# Patient Record
Sex: Male | Born: 1945 | Race: White | Hispanic: No | Marital: Married | Smoking: Former smoker
Health system: Southern US, Community
[De-identification: ages and names within clinical notes are randomized; demographics above are authoritative.]

## PROBLEM LIST (undated history)

## (undated) DIAGNOSIS — J449 Chronic obstructive pulmonary disease, unspecified: Secondary | ICD-10-CM

## (undated) DIAGNOSIS — E785 Hyperlipidemia, unspecified: Secondary | ICD-10-CM

## (undated) DIAGNOSIS — Z860100 Personal history of colon polyps, unspecified: Secondary | ICD-10-CM

## (undated) DIAGNOSIS — I2510 Atherosclerotic heart disease of native coronary artery without angina pectoris: Secondary | ICD-10-CM

## (undated) DIAGNOSIS — E559 Vitamin D deficiency, unspecified: Secondary | ICD-10-CM

## (undated) DIAGNOSIS — J45909 Unspecified asthma, uncomplicated: Secondary | ICD-10-CM

## (undated) DIAGNOSIS — G4730 Sleep apnea, unspecified: Secondary | ICD-10-CM

## (undated) DIAGNOSIS — K219 Gastro-esophageal reflux disease without esophagitis: Secondary | ICD-10-CM

## (undated) DIAGNOSIS — N529 Male erectile dysfunction, unspecified: Secondary | ICD-10-CM

## (undated) DIAGNOSIS — N400 Enlarged prostate without lower urinary tract symptoms: Secondary | ICD-10-CM

## (undated) DIAGNOSIS — I10 Essential (primary) hypertension: Secondary | ICD-10-CM

## (undated) DIAGNOSIS — R011 Cardiac murmur, unspecified: Secondary | ICD-10-CM

## (undated) DIAGNOSIS — M545 Low back pain, unspecified: Secondary | ICD-10-CM

## (undated) DIAGNOSIS — F329 Major depressive disorder, single episode, unspecified: Secondary | ICD-10-CM

## (undated) DIAGNOSIS — F419 Anxiety disorder, unspecified: Secondary | ICD-10-CM

## (undated) DIAGNOSIS — F919 Conduct disorder, unspecified: Secondary | ICD-10-CM

## (undated) DIAGNOSIS — D696 Thrombocytopenia, unspecified: Secondary | ICD-10-CM

## (undated) DIAGNOSIS — F4310 Post-traumatic stress disorder, unspecified: Secondary | ICD-10-CM

## (undated) DIAGNOSIS — E119 Type 2 diabetes mellitus without complications: Secondary | ICD-10-CM

## (undated) DIAGNOSIS — K5900 Constipation, unspecified: Secondary | ICD-10-CM

## (undated) HISTORY — PX: CORONARY ARTERY BYPASS GRAFT: SHX141

## (undated) HISTORY — PX: SEPTOPLASTY: SUR1290

## (undated) HISTORY — PX: COLONOSCOPY: SHX174

## (undated) HISTORY — PX: OTHER SURGICAL HISTORY: SHX169

## (undated) HISTORY — PX: CORONARY ANGIOPLASTY: SHX604

## (undated) HISTORY — PX: TONSILLECTOMY: SUR1361

## (undated) HISTORY — DX: Anxiety disorder, unspecified: F41.9

---

## 1997-09-22 ENCOUNTER — Encounter: Admission: RE | Admit: 1997-09-22 | Discharge: 1997-12-21 | Payer: Self-pay | Admitting: Anesthesiology

## 1998-01-04 ENCOUNTER — Inpatient Hospital Stay (HOSPITAL_COMMUNITY): Admission: EM | Admit: 1998-01-04 | Discharge: 1998-01-10 | Payer: Self-pay | Admitting: Emergency Medicine

## 1998-01-05 ENCOUNTER — Encounter: Payer: Self-pay | Admitting: Neurology

## 1999-01-17 ENCOUNTER — Emergency Department (HOSPITAL_COMMUNITY): Admission: EM | Admit: 1999-01-17 | Discharge: 1999-01-17 | Payer: Self-pay | Admitting: Emergency Medicine

## 1999-02-20 ENCOUNTER — Encounter: Payer: Self-pay | Admitting: Neurosurgery

## 1999-02-22 ENCOUNTER — Ambulatory Visit (HOSPITAL_COMMUNITY): Admission: RE | Admit: 1999-02-22 | Discharge: 1999-02-22 | Payer: Self-pay | Admitting: Neurosurgery

## 1999-02-22 ENCOUNTER — Encounter: Payer: Self-pay | Admitting: Neurosurgery

## 1999-04-10 HISTORY — PX: CAROTID STENT: SHX1301

## 1999-04-10 HISTORY — PX: LUMBAR FUSION: SHX111

## 2004-04-09 HISTORY — PX: CORONARY ARTERY BYPASS GRAFT: SHX141

## 2005-04-09 HISTORY — PX: CAROTID STENT: SHX1301

## 2005-08-08 ENCOUNTER — Encounter: Payer: Self-pay | Admitting: Emergency Medicine

## 2012-09-13 DIAGNOSIS — N400 Enlarged prostate without lower urinary tract symptoms: Secondary | ICD-10-CM

## 2012-09-13 HISTORY — DX: Benign prostatic hyperplasia without lower urinary tract symptoms: N40.0

## 2013-01-21 DIAGNOSIS — F411 Generalized anxiety disorder: Secondary | ICD-10-CM

## 2013-01-21 HISTORY — DX: Generalized anxiety disorder: F41.1

## 2013-01-22 ENCOUNTER — Other Ambulatory Visit: Payer: Self-pay | Admitting: Family Medicine

## 2013-01-22 DIAGNOSIS — G629 Polyneuropathy, unspecified: Secondary | ICD-10-CM

## 2013-01-22 DIAGNOSIS — R531 Weakness: Secondary | ICD-10-CM

## 2013-01-22 DIAGNOSIS — M6250 Muscle wasting and atrophy, not elsewhere classified, unspecified site: Secondary | ICD-10-CM

## 2013-01-28 ENCOUNTER — Ambulatory Visit (INDEPENDENT_AMBULATORY_CARE_PROVIDER_SITE_OTHER): Payer: Medicare Other | Admitting: Neurology

## 2013-01-28 ENCOUNTER — Other Ambulatory Visit: Payer: Self-pay | Admitting: Neurology

## 2013-01-28 ENCOUNTER — Ambulatory Visit (INDEPENDENT_AMBULATORY_CARE_PROVIDER_SITE_OTHER): Payer: Self-pay | Admitting: Radiology

## 2013-01-28 DIAGNOSIS — Z0289 Encounter for other administrative examinations: Secondary | ICD-10-CM

## 2013-01-28 DIAGNOSIS — R209 Unspecified disturbances of skin sensation: Secondary | ICD-10-CM

## 2013-01-28 DIAGNOSIS — G5602 Carpal tunnel syndrome, left upper limb: Secondary | ICD-10-CM

## 2013-01-28 DIAGNOSIS — G63 Polyneuropathy in diseases classified elsewhere: Secondary | ICD-10-CM

## 2013-01-28 NOTE — Procedures (Signed)
  HISTORY:  Albert Hayes is a 67 year old gentleman with a six-month history of a progressive quadriparesis, numbness in the feet and legs, and problems with gait instability. The patient has noted some problems controlling the bladder as well over the last 6 months. The patient is being evaluated for these issues.  NERVE CONDUCTION STUDIES:  Nerve conduction studies were performed on the left upper extremity. The distal motor latency for the median nerve was prolonged, with a normal motor amplitude. The distal motor latency and motor amplitudes for the left ulnar nerve is normal. The F wave latencies for the left median and ulnar nerves were normal, with slowing seen for the left median nerve, normal nerve conduction velocities seen for the ulnar nerve. The sensory latencies for the left median, ulnar, and radial nerves were prolonged.  Nerve conduction studies were performed on both lower extremities. The distal motor latencies for the peroneal nerves were normal bilaterally, with low motor amplitudes for these nerves bilaterally. The distal motor latencies and motor amplitudes for the posterior tibial nerves were normal. The nerve conduction velocities for the peroneal nerves were slowed above the knees, normal below the knees. Slowing was seen for the posterior tibial nerves bilaterally. The F wave latencies for the peroneal nerves were prolonged on the right, unobtainable on the left, and normal for the posterior tibial nerves bilaterally. The sural sensory latencies were normal bilaterally, but the peroneal sensory latencies were absent bilaterally.  EMG STUDIES:  EMG study was performed on the left lower extremity:  The tibialis anterior muscle reveals 2 to 45K motor units with decreased recruitment. No fibrillations or positive waves were seen. The peroneus tertius muscle reveals 2 to 6K motor units with decreased recruitment. No fibrillations or positive waves were seen. The medial  gastrocnemius muscle reveals 1 to 3K motor units with decreased recruitment. No fibrillations or positive waves were seen.  The patient refused further EMG evaluation.   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 There is an overlying left carpal tunnel syndrome that is mild. A limited EMG evaluation of the left lower extremity shows chronic stable denervation below the knee consistent with the diagnosis of the peripheral neuropathy. The patient refused further EMG evaluation of the left leg, and the left arm. The patient refused further study. A left lumbosacral radiculopathy cannot be excluded on this evaluation. An anterior horn cell disease process cannot be confirmed or excluded, but the clinical history suggests that an anterior horn cell disease process is not likely. A cervical myelopathy should be considered.  [name] MD 01/28/2013 10:54 AM  [facility] [address] [place], [place] [address]  Phone [phone] Fax [phone]

## 2013-01-29 ENCOUNTER — Ambulatory Visit
Admission: RE | Admit: 2013-01-29 | Discharge: 2013-01-29 | Disposition: A | Discharge status: Home / Self Care | Payer: Medicare Other | Source: Ambulatory Visit | Attending: Family Medicine | Admitting: Family Medicine

## 2013-01-29 DIAGNOSIS — G629 Polyneuropathy, unspecified: Secondary | ICD-10-CM

## 2013-01-29 DIAGNOSIS — M6250 Muscle wasting and atrophy, not elsewhere classified, unspecified site: Secondary | ICD-10-CM

## 2013-01-29 DIAGNOSIS — R531 Weakness: Secondary | ICD-10-CM

## 2013-02-27 DIAGNOSIS — F4310 Post-traumatic stress disorder, unspecified: Secondary | ICD-10-CM

## 2013-02-27 DIAGNOSIS — M5030 Other cervical disc degeneration, unspecified cervical region: Secondary | ICD-10-CM

## 2013-02-27 DIAGNOSIS — M47812 Spondylosis without myelopathy or radiculopathy, cervical region: Secondary | ICD-10-CM

## 2013-02-27 DIAGNOSIS — R404 Transient alteration of awareness: Secondary | ICD-10-CM | POA: Insufficient documentation

## 2013-02-27 DIAGNOSIS — M5137 Other intervertebral disc degeneration, lumbosacral region: Secondary | ICD-10-CM

## 2013-02-27 DIAGNOSIS — M47817 Spondylosis without myelopathy or radiculopathy, lumbosacral region: Secondary | ICD-10-CM

## 2013-02-27 DIAGNOSIS — M51379 Other intervertebral disc degeneration, lumbosacral region without mention of lumbar back pain or lower extremity pain: Secondary | ICD-10-CM

## 2013-02-27 HISTORY — DX: Post-traumatic stress disorder, unspecified: F43.10

## 2013-02-27 HISTORY — DX: Spondylosis without myelopathy or radiculopathy, cervical region: M47.812

## 2013-02-27 HISTORY — DX: Spondylosis without myelopathy or radiculopathy, lumbosacral region: M47.817

## 2013-02-27 HISTORY — DX: Other intervertebral disc degeneration, lumbosacral region without mention of lumbar back pain or lower extremity pain: M51.379

## 2013-02-27 HISTORY — DX: Other intervertebral disc degeneration, lumbosacral region: M51.37

## 2013-02-27 HISTORY — DX: Other cervical disc degeneration, unspecified cervical region: M50.30

## 2013-11-03 ENCOUNTER — Ambulatory Visit: Payer: Medicare Other | Attending: Family Medicine | Admitting: Physical Therapy

## 2013-11-03 DIAGNOSIS — I10 Essential (primary) hypertension: Secondary | ICD-10-CM | POA: Diagnosis not present

## 2013-11-03 DIAGNOSIS — R5381 Other malaise: Secondary | ICD-10-CM | POA: Insufficient documentation

## 2013-11-03 DIAGNOSIS — M545 Low back pain, unspecified: Secondary | ICD-10-CM | POA: Insufficient documentation

## 2013-11-03 DIAGNOSIS — R262 Difficulty in walking, not elsewhere classified: Secondary | ICD-10-CM | POA: Insufficient documentation

## 2013-11-03 DIAGNOSIS — R293 Abnormal posture: Secondary | ICD-10-CM | POA: Diagnosis not present

## 2013-11-03 DIAGNOSIS — Z951 Presence of aortocoronary bypass graft: Secondary | ICD-10-CM | POA: Insufficient documentation

## 2013-11-03 DIAGNOSIS — M6281 Muscle weakness (generalized): Secondary | ICD-10-CM | POA: Insufficient documentation

## 2013-11-03 DIAGNOSIS — R32 Unspecified urinary incontinence: Secondary | ICD-10-CM | POA: Insufficient documentation

## 2013-11-03 DIAGNOSIS — IMO0001 Reserved for inherently not codable concepts without codable children: Secondary | ICD-10-CM | POA: Diagnosis present

## 2013-11-03 DIAGNOSIS — G609 Hereditary and idiopathic neuropathy, unspecified: Secondary | ICD-10-CM | POA: Insufficient documentation

## 2013-11-03 DIAGNOSIS — R5383 Other fatigue: Secondary | ICD-10-CM

## 2013-11-05 ENCOUNTER — Ambulatory Visit: Payer: Medicare Other | Admitting: Rehabilitation

## 2013-11-05 DIAGNOSIS — IMO0001 Reserved for inherently not codable concepts without codable children: Secondary | ICD-10-CM | POA: Diagnosis not present

## 2013-11-10 ENCOUNTER — Ambulatory Visit: Payer: Medicare Other | Attending: Family Medicine | Admitting: Physical Therapy

## 2013-11-10 DIAGNOSIS — M6281 Muscle weakness (generalized): Secondary | ICD-10-CM | POA: Insufficient documentation

## 2013-11-10 DIAGNOSIS — M545 Low back pain, unspecified: Secondary | ICD-10-CM | POA: Diagnosis not present

## 2013-11-10 DIAGNOSIS — R5383 Other fatigue: Secondary | ICD-10-CM | POA: Diagnosis not present

## 2013-11-10 DIAGNOSIS — R293 Abnormal posture: Secondary | ICD-10-CM | POA: Diagnosis not present

## 2013-11-10 DIAGNOSIS — G609 Hereditary and idiopathic neuropathy, unspecified: Secondary | ICD-10-CM | POA: Insufficient documentation

## 2013-11-10 DIAGNOSIS — IMO0001 Reserved for inherently not codable concepts without codable children: Secondary | ICD-10-CM | POA: Insufficient documentation

## 2013-11-10 DIAGNOSIS — R32 Unspecified urinary incontinence: Secondary | ICD-10-CM | POA: Diagnosis not present

## 2013-11-10 DIAGNOSIS — I10 Essential (primary) hypertension: Secondary | ICD-10-CM | POA: Insufficient documentation

## 2013-11-10 DIAGNOSIS — R5381 Other malaise: Secondary | ICD-10-CM | POA: Insufficient documentation

## 2013-11-10 DIAGNOSIS — R262 Difficulty in walking, not elsewhere classified: Secondary | ICD-10-CM | POA: Insufficient documentation

## 2013-11-10 DIAGNOSIS — Z951 Presence of aortocoronary bypass graft: Secondary | ICD-10-CM | POA: Insufficient documentation

## 2013-11-12 ENCOUNTER — Ambulatory Visit: Payer: Medicare Other | Admitting: Rehabilitation

## 2013-11-12 DIAGNOSIS — IMO0001 Reserved for inherently not codable concepts without codable children: Secondary | ICD-10-CM | POA: Diagnosis not present

## 2013-11-17 ENCOUNTER — Ambulatory Visit: Payer: Medicare Other | Admitting: Physical Therapy

## 2013-11-17 DIAGNOSIS — IMO0001 Reserved for inherently not codable concepts without codable children: Secondary | ICD-10-CM | POA: Diagnosis not present

## 2013-11-19 ENCOUNTER — Ambulatory Visit: Payer: Medicare Other | Admitting: Rehabilitation

## 2013-11-19 DIAGNOSIS — IMO0001 Reserved for inherently not codable concepts without codable children: Secondary | ICD-10-CM | POA: Diagnosis not present

## 2013-11-24 ENCOUNTER — Ambulatory Visit: Payer: Medicare Other | Admitting: Physical Therapy

## 2013-11-24 DIAGNOSIS — IMO0001 Reserved for inherently not codable concepts without codable children: Secondary | ICD-10-CM | POA: Diagnosis not present

## 2013-11-26 ENCOUNTER — Ambulatory Visit: Payer: Medicare Other | Admitting: Physical Therapy

## 2013-11-26 DIAGNOSIS — IMO0001 Reserved for inherently not codable concepts without codable children: Secondary | ICD-10-CM | POA: Diagnosis not present

## 2013-12-01 ENCOUNTER — Ambulatory Visit: Payer: Medicare Other | Admitting: Rehabilitation

## 2013-12-01 DIAGNOSIS — IMO0001 Reserved for inherently not codable concepts without codable children: Secondary | ICD-10-CM | POA: Diagnosis not present

## 2013-12-03 ENCOUNTER — Ambulatory Visit: Payer: Medicare Other | Admitting: Physical Therapy

## 2013-12-03 DIAGNOSIS — IMO0001 Reserved for inherently not codable concepts without codable children: Secondary | ICD-10-CM | POA: Diagnosis not present

## 2013-12-08 ENCOUNTER — Ambulatory Visit: Payer: Medicare Other | Attending: Family Medicine | Admitting: Rehabilitation

## 2013-12-08 DIAGNOSIS — I10 Essential (primary) hypertension: Secondary | ICD-10-CM | POA: Insufficient documentation

## 2013-12-08 DIAGNOSIS — M545 Low back pain, unspecified: Secondary | ICD-10-CM | POA: Insufficient documentation

## 2013-12-08 DIAGNOSIS — R293 Abnormal posture: Secondary | ICD-10-CM | POA: Insufficient documentation

## 2013-12-08 DIAGNOSIS — M6281 Muscle weakness (generalized): Secondary | ICD-10-CM | POA: Diagnosis not present

## 2013-12-08 DIAGNOSIS — G609 Hereditary and idiopathic neuropathy, unspecified: Secondary | ICD-10-CM | POA: Insufficient documentation

## 2013-12-08 DIAGNOSIS — IMO0001 Reserved for inherently not codable concepts without codable children: Secondary | ICD-10-CM | POA: Diagnosis present

## 2013-12-08 DIAGNOSIS — R262 Difficulty in walking, not elsewhere classified: Secondary | ICD-10-CM | POA: Insufficient documentation

## 2013-12-08 DIAGNOSIS — Z951 Presence of aortocoronary bypass graft: Secondary | ICD-10-CM | POA: Diagnosis not present

## 2013-12-08 DIAGNOSIS — R5383 Other fatigue: Secondary | ICD-10-CM

## 2013-12-08 DIAGNOSIS — R5381 Other malaise: Secondary | ICD-10-CM | POA: Diagnosis not present

## 2013-12-08 DIAGNOSIS — R32 Unspecified urinary incontinence: Secondary | ICD-10-CM | POA: Diagnosis not present

## 2013-12-10 ENCOUNTER — Ambulatory Visit: Payer: Medicare Other | Admitting: Rehabilitation

## 2013-12-10 DIAGNOSIS — IMO0001 Reserved for inherently not codable concepts without codable children: Secondary | ICD-10-CM | POA: Diagnosis not present

## 2013-12-15 ENCOUNTER — Ambulatory Visit: Payer: Medicare Other | Admitting: Physical Therapy

## 2013-12-15 DIAGNOSIS — IMO0001 Reserved for inherently not codable concepts without codable children: Secondary | ICD-10-CM | POA: Diagnosis not present

## 2013-12-17 ENCOUNTER — Ambulatory Visit: Payer: Medicare Other | Admitting: Physical Therapy

## 2014-04-09 HISTORY — PX: ESOPHAGOGASTRODUODENOSCOPY: SHX1529

## 2014-12-22 DIAGNOSIS — I10 Essential (primary) hypertension: Secondary | ICD-10-CM | POA: Insufficient documentation

## 2014-12-22 DIAGNOSIS — E785 Hyperlipidemia, unspecified: Secondary | ICD-10-CM | POA: Insufficient documentation

## 2014-12-22 DIAGNOSIS — J449 Chronic obstructive pulmonary disease, unspecified: Secondary | ICD-10-CM

## 2014-12-22 DIAGNOSIS — I2510 Atherosclerotic heart disease of native coronary artery without angina pectoris: Secondary | ICD-10-CM | POA: Insufficient documentation

## 2014-12-22 DIAGNOSIS — Z951 Presence of aortocoronary bypass graft: Secondary | ICD-10-CM | POA: Insufficient documentation

## 2014-12-22 HISTORY — DX: Presence of aortocoronary bypass graft: Z95.1

## 2014-12-22 HISTORY — DX: Atherosclerotic heart disease of native coronary artery without angina pectoris: I25.10

## 2014-12-22 HISTORY — DX: Chronic obstructive pulmonary disease, unspecified: J44.9

## 2015-02-14 ENCOUNTER — Encounter: Payer: Self-pay | Admitting: Family Medicine

## 2015-02-14 ENCOUNTER — Ambulatory Visit (INDEPENDENT_AMBULATORY_CARE_PROVIDER_SITE_OTHER): Payer: Medicare Other | Admitting: Family Medicine

## 2015-02-14 VITALS — BP 130/84 | HR 78 | Temp 98.8°F | Resp 16 | Ht 67.5 in | Wt 162.4 lb

## 2015-02-14 DIAGNOSIS — N529 Male erectile dysfunction, unspecified: Secondary | ICD-10-CM | POA: Diagnosis not present

## 2015-02-14 DIAGNOSIS — Z131 Encounter for screening for diabetes mellitus: Secondary | ICD-10-CM

## 2015-02-14 DIAGNOSIS — I10 Essential (primary) hypertension: Secondary | ICD-10-CM | POA: Diagnosis not present

## 2015-02-14 DIAGNOSIS — Z13220 Encounter for screening for lipoid disorders: Secondary | ICD-10-CM

## 2015-02-14 DIAGNOSIS — Z0000 Encounter for general adult medical examination without abnormal findings: Secondary | ICD-10-CM | POA: Diagnosis not present

## 2015-02-14 MED ORDER — SILDENAFIL CITRATE 20 MG PO TABS: ORAL_TABLET | ORAL | Status: DC

## 2015-02-14 NOTE — Patient Instructions (Signed)
You should receive a call or letter about your lab results within the next week to 10 days.  Keep follow up with your cardiologist.  Sign up for mychart and I can email lab results and plan.   Return to the clinic or go to the nearest emergency room if any of your symptoms worsen or new symptoms occur.  Keeping you healthy  Get these tests  Blood pressure- Have your blood pressure checked once a year by your healthcare provider.  Normal blood pressure is 120/80  Weight- Have your body mass index (BMI) calculated to screen for obesity.  BMI is a measure of body fat based on height and weight. You can also calculate your own BMI at ViewBanking.[email or web address].  Cholesterol- Have your cholesterol checked every year.  Diabetes- Have your blood sugar checked regularly if you have high blood pressure, high cholesterol, have a family history of diabetes or if you are overweight.  Screening for Colon Cancer- Colonoscopy starting at age 71.  Screening may begin sooner depending on your family history and other health conditions. Follow up colonoscopy as directed by your Gastroenterologist.  Screening for Prostate Cancer- Both blood work (PSA) and a rectal exam help screen for Prostate Cancer.  Screening begins at age 74 with African-American men and at age 30 with Caucasian men.  Screening may begin sooner depending on your family history.  Take these medicines  Aspirin- One aspirin daily can help prevent Heart disease and Stroke.  Flu shot- Every fall.  Tetanus- Every 10 years.  Zostavax- Once after the age of 63 to prevent Shingles.  Pneumonia shot- Once after the age of 79; if you are younger than 40, ask your healthcare provider if you need a Pneumonia shot.  Take these steps  Don't smoke- If you do smoke, talk to your doctor about quitting.  For tips on how to quit, go to www.smokefree.gov or call 1-800-QUIT-NOW.  Be physically active- Exercise 5 days a week for at least 30 minutes.  If  you are not already physically active start slow and gradually work up to 30 minutes of moderate physical activity.  Examples of moderate activity include walking briskly, mowing the yard, dancing, swimming, bicycling, etc.  Eat a healthy diet- Eat a variety of healthy food such as fruits, vegetables, low fat milk, low fat cheese, yogurt, lean meant, poultry, fish, beans, tofu, etc. For more information go to www.thenutritionsource.org  Drink alcohol in moderation- Limit alcohol intake to less than two drinks a day. Never drink and drive.  Dentist- Brush and floss twice daily; visit your dentist twice a year.  Depression- Your emotional health is as important as your physical health. If you're feeling down, or losing interest in things you would normally enjoy please talk to your healthcare provider.  Eye exam- Visit your eye doctor every year.  Safe sex- If you may be exposed to a sexually transmitted infection, use a condom.  Seat belts- Seat belts can save your life; always wear one.  Smoke/Carbon Monoxide detectors- These detectors need to be installed on the appropriate level of your home.  Replace batteries at least once a year.  Skin cancer- When out in the sun, cover up and use sunscreen 15 SPF or higher.  Violence- If anyone is threatening you, please tell your healthcare provider.  Living Will/ Health care power of attorney- Speak with your healthcare provider and family.

## 2015-02-14 NOTE — Progress Notes (Signed)
   Subjective:    Patient ID: Albert Hayes, male    DOB: 08-May-1945, 69 y.o.   MRN: [id number]  HPI    Review of Systems  Constitutional: Negative.   HENT: Negative.   Eyes: Negative.   Respiratory: Negative.   Cardiovascular: Negative.   Endocrine: Negative.   Genitourinary: Negative.   Musculoskeletal: Negative.   Skin: Negative.   Allergic/Immunologic: Negative.   Neurological: Negative.   Hematological: Negative.   Psychiatric/Behavioral: The patient is nervous/anxious.        Objective:   Physical Exam        Assessment & Plan:

## 2015-02-14 NOTE — Progress Notes (Addendum)
Subjective:    Patient ID: Albert Hayes, male    DOB: 03/26/1946, 69 y.o.   MRN: [id number] This chart was scribed for Merri Ray, MD by Zola Button, Medical Scribe. This patient was seen in Room 25 and the patient's care was started at 2:27 PM.    HPI HPI Comments: Albert Hayes is a 69 y.o. male who presents to the [facility] for an annual Medicare Wellness Exam. He is a new patient to me. Previously followed by Dr. Jannifer Franklin, neurology, for polyneuropathy and carpal tunnel syndrome. Takes Neurontin. Based on outside records, he has a history of COPD, and CAD s/p CABG (2005). Patient states he is on Livalo and ramipril for preventive measures. He is followed by Dr. Ricky Ala, cardiologist, with [facility] in [facility]. He is prescribed Neurontin for anxiety by a psychiatrist (he does not remember the name). He has been doing well with his anxiety. Patient is fasting today; he last ate before midnight last night.  Cancer screening:  Colon cancer screening - He is due for colonoscopy in 2017. Prostate cancer screening - Patient denies history of prostate problems. No FMHx of prostate cancer. Discussed prostate cancer screening, deferred at this point.  Immunizations: UTD on Tdap. Has received Zostavax, flu in October. He has had 1 pneumococcal vaccine in October 2015 at a pharmacy, but unknown which type.   Depression screening:  Depression screen [facility] 2/9 02/14/2015  Decreased Interest 0  Down, Depressed, Hopeless 0  PHQ - 2 Score 0   Fall screening: 0 falls in the past year.  Functional status survey: No positive responses on survey in office.   Vision: He wears corrective lenses. He saw his eye doctor last week and was told he needs to have cataracts surgery in his left eye.  Visual Acuity Screening   Right eye Left eye Both eyes  Without correction:     With correction: 20/40 20/70 20/25     Hearing: Denies difficulty hearing.   Dentist:  Patient has dentures in his top jaw and a few real teeth in his bottom jaw (caps on most of his teeth).  Advanced directives: He has a living will in the system at [facility].  Erectile dysfunction: He has used Cialis before, but has not used it in 2 years. He was cleared to take it by his cardiologist. He has not had sildenafil before. Patient denies chest tightness and SOB with the Cialis.  Patient is a retired Scientist, clinical (histocompatibility and immunogenetics).  There are no active problems to display for this patient.  Past Medical History  Diagnosis Date  . Anxiety    Past Surgical History  Procedure Laterality Date  . Coronary artery bypass graft    . Spine/bone surgery     Allergies  Allergen Reactions  . Bupropion   . Fluoxetine   . Fluvoxamine   . Penicillins     severely allergic  . Tamsulosin    Prior to Admission medications   Medication Sig Start Date End Date Taking? Authorizing Provider  aspirin 81 MG tablet Take 81 mg by mouth daily.   Yes Historical Provider, MD  gabapentin (NEURONTIN) 100 MG capsule Take 100 mg by mouth 3 (three) times daily.   Yes Historical Provider, MD  Pitavastatin Calcium (LIVALO) 2 MG TABS Take by mouth.   Yes Historical Provider, MD  ramipril (ALTACE) 2.5 MG capsule Take 2.5 mg by mouth daily.   Yes Historical Provider, MD   Social  History   Social History  . Marital Status: Married    Spouse Name: N/A  . Number of Children: N/A  . Years of Education: N/A   Occupational History  . Retired Scientist, clinical (histocompatibility and immunogenetics)    Social History Main Topics  . Smoking status: Former Research scientist (life sciences)  . Smokeless tobacco: Not on file  . Alcohol Use: No  . Drug Use: No  . Sexual Activity: Not on file   Other Topics Concern  . Not on file   Social History Narrative   Married. Education: high school/other. Exercise: Yes.     Review of Systems  Psychiatric/Behavioral: The patient is nervous/anxious.   13 point ROS reviewed on patient health survey. Negative other than listed above  or in nursing note. See nursing note.  Additionally, he wishes to discuss ED/sildenafil     Objective:   Physical Exam  Constitutional: He is oriented to person, place, and time. He appears well-developed and well-nourished.  HENT:  Head: Normocephalic and atraumatic.  Right Ear: External ear normal.  Left Ear: External ear normal.  Mouth/Throat: Oropharynx is clear and moist.  Eyes: Conjunctivae and EOM are normal. Pupils are equal, round, and reactive to light.  Neck: Normal range of motion. Neck supple. No thyromegaly present.  Cardiovascular: Normal rate, regular rhythm, normal heart sounds and intact distal pulses.   Pulmonary/Chest: Effort normal and breath sounds normal. No respiratory distress. He has no wheezes.  Abdominal: Soft. He exhibits no distension. There is no tenderness. Hernia confirmed negative in the right inguinal area and confirmed negative in the left inguinal area.  Musculoskeletal: Normal range of motion. He exhibits no edema or tenderness.  Lymphadenopathy:    He has no cervical adenopathy.  Neurological: He is alert and oriented to person, place, and time. He has normal reflexes.  Skin: Skin is warm and dry.  Psychiatric: He has a normal mood and affect. His behavior is normal.  Vitals reviewed.     Filed Vitals:   02/14/15 1353  BP: 130/84  Pulse: 78  Temp: 98.8 F (37.1 C)  TempSrc: Oral  Resp: 16  Height: 5' 7.5" (1.715 m)  Weight: 162 lb 6.4 oz (73.664 kg)  SpO2: 97%       Assessment & Plan:   Albert Hayes is a 69 y.o. male Medicare annual wellness visit, subsequent  --anticipatory guidance as below in AVS, screening labs above. Health maintenance items as above in HPI discussed/recommended as applicable.   Screening for diabetes mellitus - Plan: COMPLETE METABOLIC PANEL WITH GFR  Screening for hyperlipidemia - Plan: Lipid panel  Erectile dysfunction, unspecified erectile dysfunction type - Plan: sildenafil (REVATIO) 20 MG  tablet  -Tolerated Cialis in the past. Has been given the go ahead by his cardiologist to use this medication. sildenafil Rx given - discussed off label use, but similar to Viagra.. use lowest effective dose. Side effects discussed (including but not limited to headache/flushing, blue discoloration of vision, possible vascular steal and risk of cardiac effects if underlying unknown coronary artery disease, and permanent sensorineural hearing loss). Understanding expressed.   Meds ordered this encounter  Medications  . Pitavastatin Calcium (LIVALO) 2 MG TABS    Sig: Take by mouth.  . gabapentin (NEURONTIN) 100 MG capsule    Sig: Take 100 mg by mouth 3 (three) times daily.  . ramipril (ALTACE) 2.5 MG capsule    Sig: Take 2.5 mg by mouth daily.  Marland Kitchen aspirin 81 MG tablet    Sig: Take 81 mg  by mouth daily.  . sildenafil (REVATIO) 20 MG tablet    Sig: 1-2 tablets prior to onset of sexual activity.    Dispense:  30 tablet    Refill:  0   Patient Instructions  You should receive a call or letter about your lab results within the next week to 10 days.  Keep follow up with your cardiologist.  Sign up for mychart and I can email lab results and plan.   Return to the clinic or go to the nearest emergency room if any of your symptoms worsen or new symptoms occur.  Keeping you healthy  Get these tests  Blood pressure- Have your blood pressure checked once a year by your healthcare provider.  Normal blood pressure is 120/80  Weight- Have your body mass index (BMI) calculated to screen for obesity.  BMI is a measure of body fat based on height and weight. You can also calculate your own BMI at [employer]si.  Cholesterol- Have your cholesterol checked every year.  Diabetes- Have your blood sugar checked regularly if you have high blood pressure, high cholesterol, have a family history of diabetes or if you are overweight.  Screening for Colon Cancer- Colonoscopy starting at age 76.   Screening may begin sooner depending on your family history and other health conditions. Follow up colonoscopy as directed by your Gastroenterologist.  Screening for Prostate Cancer- Both blood work (PSA) and a rectal exam help screen for Prostate Cancer.  Screening begins at age 67 with African-American men and at age 27 with Caucasian men.  Screening may begin sooner depending on your family history.  Take these medicines  Aspirin- One aspirin daily can help prevent Heart disease and Stroke.  Flu shot- Every fall.  Tetanus- Every 10 years.  Zostavax- Once after the age of 63 to prevent Shingles.  Pneumonia shot- Once after the age of 22; if you are younger than 68, ask your healthcare provider if you need a Pneumonia shot.  Take these steps  Don't smoke- If you do smoke, talk to your doctor about quitting.  For tips on how to quit, go to www.smokefree.gov or call 1-800-QUIT-NOW.  Be physically active- Exercise 5 days a week for at least 30 minutes.  If you are not already physically active start slow and gradually work up to 30 minutes of moderate physical activity.  Examples of moderate activity include walking briskly, mowing the yard, dancing, swimming, bicycling, etc.  Eat a healthy diet- Eat a variety of healthy food such as fruits, vegetables, low fat milk, low fat cheese, yogurt, lean meant, poultry, fish, beans, tofu, etc. For more information go to www.thenutritionsource.org  Drink alcohol in moderation- Limit alcohol intake to less than two drinks a day. Never drink and drive.  Dentist- Brush and floss twice daily; visit your dentist twice a year.  Depression- Your emotional health is as important as your physical health. If you're feeling down, or losing interest in things you would normally enjoy please talk to your healthcare provider.  Eye exam- Visit your eye doctor every year.  Safe sex- If you may be exposed to a sexually transmitted infection, use a condom.  Seat  belts- Seat belts can save your life; always wear one.  Smoke/Carbon Monoxide detectors- These detectors need to be installed on the appropriate level of your home.  Replace batteries at least once a year.  Skin cancer- When out in the sun, cover up and use sunscreen 15 SPF or higher.  Violence- If  anyone is threatening you, please tell your healthcare provider.  Living Will/ Health care power of attorney- Speak with your healthcare provider and family.    I personally performed the services described in this documentation, which was scribed in my presence. The recorded information has been reviewed and considered, and addended by me as needed.    By signing my name below, I, Zola Button, attest that this documentation has been prepared under the direction and in the presence of Merri Ray, MD.  Electronically Signed: Zola Button, Medical Scribe. 02/14/2015. 2:27 PM.   I personally performed the services described in this documentation, which was scribed in my presence. The recorded information has been reviewed and considered, and addended by me as needed.

## 2015-02-15 LAB — COMPLETE METABOLIC PANEL WITH GFR
ALBUMIN: 4.3 g/dL (ref 3.6–5.1)
ALK PHOS: 72 U/L (ref 40–115)
ALT: 18 U/L (ref 9–46)
AST: 20 U/L (ref 10–35)
BILIRUBIN TOTAL: 0.8 mg/dL (ref 0.2–1.2)
BUN: 14 mg/dL (ref 7–25)
CO2: 30 mmol/L (ref 20–31)
CREATININE: 0.87 mg/dL (ref 0.70–1.25)
Calcium: 9.2 mg/dL (ref 8.6–10.3)
Chloride: 98 mmol/L (ref 98–110)
GFR, Est African American: >89 mL/min (ref >=60)
GFR, Est Non African American: 88 mL/min (ref >=60)
GLUCOSE: 85 mg/dL (ref 65–99)
Potassium: 4.4 mmol/L (ref 3.5–5.3)
SODIUM: 138 mmol/L (ref 135–146)
TOTAL PROTEIN: 7.1 g/dL (ref 6.1–8.1)

## 2015-02-15 LAB — LIPID PANEL
Cholesterol: 139 mg/dL (ref 125–200)
HDL: 41 mg/dL (ref >=40)
LDL Cholesterol: 76 mg/dL (ref <130)
Total CHOL/HDL Ratio: 3.4 Ratio (ref <=5.0)
Triglycerides: 109 mg/dL (ref <150)
VLDL: 22 mg/dL (ref <30)

## 2015-03-10 DIAGNOSIS — G4733 Obstructive sleep apnea (adult) (pediatric): Secondary | ICD-10-CM | POA: Diagnosis not present

## 2015-03-10 DIAGNOSIS — J449 Chronic obstructive pulmonary disease, unspecified: Secondary | ICD-10-CM | POA: Diagnosis not present

## 2015-03-21 ENCOUNTER — Ambulatory Visit: Payer: Medicare Other | Admitting: Family Medicine

## 2015-04-01 DIAGNOSIS — I10 Essential (primary) hypertension: Secondary | ICD-10-CM | POA: Diagnosis not present

## 2015-04-01 DIAGNOSIS — H02411 Mechanical ptosis of right eyelid: Secondary | ICD-10-CM | POA: Diagnosis not present

## 2015-04-01 DIAGNOSIS — H2511 Age-related nuclear cataract, right eye: Secondary | ICD-10-CM | POA: Diagnosis not present

## 2015-04-01 DIAGNOSIS — H18412 Arcus senilis, left eye: Secondary | ICD-10-CM | POA: Diagnosis not present

## 2015-04-01 DIAGNOSIS — H18411 Arcus senilis, right eye: Secondary | ICD-10-CM | POA: Diagnosis not present

## 2015-04-10 HISTORY — PX: ADENOIDECTOMY: SUR15

## 2015-04-10 HISTORY — PX: CATARACT EXTRACTION: SUR2

## 2015-05-13 DIAGNOSIS — H25012 Cortical age-related cataract, left eye: Secondary | ICD-10-CM | POA: Diagnosis not present

## 2015-05-13 DIAGNOSIS — H2512 Age-related nuclear cataract, left eye: Secondary | ICD-10-CM | POA: Diagnosis not present

## 2015-05-13 DIAGNOSIS — H2511 Age-related nuclear cataract, right eye: Secondary | ICD-10-CM | POA: Diagnosis not present

## 2015-05-13 DIAGNOSIS — H25042 Posterior subcapsular polar age-related cataract, left eye: Secondary | ICD-10-CM | POA: Diagnosis not present

## 2015-05-13 DIAGNOSIS — H25011 Cortical age-related cataract, right eye: Secondary | ICD-10-CM | POA: Diagnosis not present

## 2015-05-13 DIAGNOSIS — H25811 Combined forms of age-related cataract, right eye: Secondary | ICD-10-CM | POA: Diagnosis not present

## 2015-05-25 ENCOUNTER — Ambulatory Visit (INDEPENDENT_AMBULATORY_CARE_PROVIDER_SITE_OTHER): Payer: Medicare Other | Admitting: Family Medicine

## 2015-05-25 ENCOUNTER — Encounter: Payer: Self-pay | Admitting: Family Medicine

## 2015-05-25 VITALS — BP 126/80 | HR 72 | Temp 97.9°F | Resp 16 | Ht 68.0 in | Wt 163.8 lb

## 2015-05-25 DIAGNOSIS — L659 Nonscarring hair loss, unspecified: Secondary | ICD-10-CM | POA: Diagnosis not present

## 2015-05-25 DIAGNOSIS — N529 Male erectile dysfunction, unspecified: Secondary | ICD-10-CM

## 2015-05-25 DIAGNOSIS — R6882 Decreased libido: Secondary | ICD-10-CM

## 2015-05-25 DIAGNOSIS — R5383 Other fatigue: Secondary | ICD-10-CM

## 2015-05-25 NOTE — Patient Instructions (Signed)
Return in the morning for blood work.  You should receive a call or letter about your lab results within the next week to 10 days.  If testosterone is low, would need to repeat at least once to verify, and if low will refer you to urology to determine treatment and other testing (such as prostate test).  Return to the clinic or go to the nearest emergency room if any of your symptoms worsen or new symptoms occur.

## 2015-05-25 NOTE — Progress Notes (Signed)
Subjective:    Patient ID: Albert Hayes, male    DOB: [birth year], 70 y.o.   MRN: [id number] By signing my name below, I, Zola Button, attest that this documentation has been prepared under the direction and in the presence of Merri Ray, MD.  Electronically Signed: Zola Button, Medical Scribe. 05/25/2015. 1:58 PM.  HPI HPI Comments: Albert Hayes is a 70 y.o. male who presents to the [facility] to discuss testosterone. Last seen November for annual Medicare wellness visit. At that time, he did endorse symptoms of erectile dysfunction. Has used Cialis before and was cleared to do so by cardiology. Did decide on sildenafil 20 mg tablets, 1-2 as needed. Of note, he did decline prostate cancer screening at last visit. He had a normal CMP and lipid panel last visit. Patient has been taking 2 sildenafil at a time and notes the effectiveness had started to wane after the first few uses. Over the past 5-6 weeks, patient has noticed increased fatigue and decreased libido. His wife has also noted some hair loss over the past 2 months. He also states he has lost about 15 pounds over the past few months. He believes his symptoms may be related to his testosterone levels. Patient denies blood in stool, cough, night sweats, increased thirst, hematuria, urinary frequency, and other urinary symptoms. He also denies depression symptoms, recent stressors, and history of thyroid problems. He is married and has not had any problems in the relationship. Patient saw his psychiatrist 1 week ago (who he sees for anxiety, which is controlled) regarding his symptoms; it did not appear to be medication-related. He does have a history of depression in the 74s.  Wt Readings from Last 3 Encounters:  05/25/15 163 lb 12.8 oz (74.299 kg)  02/14/15 162 lb 6.4 oz (73.664 kg)     Patient Active Problem List   Diagnosis Date Noted  . Arteriosclerosis of coronary artery 12/22/2014  . BP (high blood  pressure) 12/22/2014  . Chronic obstructive pulmonary disease ([place]) 12/22/2014  . H/O coronary artery bypass surgery 12/22/2014  . HLD (hyperlipidemia) 12/22/2014   Past Medical History  Diagnosis Date  . Anxiety    Past Surgical History  Procedure Laterality Date  . Coronary artery bypass graft    . Spine/bone surgery     Allergies  Allergen Reactions  . Bupropion   . Fluoxetine   . Fluvoxamine   . Penicillins     severely allergic  . Tamsulosin    Prior to Admission medications   Medication Sig Start Date End Date Taking? Authorizing Provider  aspirin 81 MG tablet Take 81 mg by mouth daily.    Historical Provider, MD  gabapentin (NEURONTIN) 100 MG capsule Take 100 mg by mouth 3 (three) times daily.    Historical Provider, MD  Pitavastatin Calcium (LIVALO) 2 MG TABS Take by mouth.    Historical Provider, MD  ramipril (ALTACE) 2.5 MG capsule Take 2.5 mg by mouth daily.    Historical Provider, MD  sildenafil (REVATIO) 20 MG tablet 1-2 tablets prior to onset of sexual activity. 02/14/15   Wendie Agreste, MD   Social History   Social History  . Marital Status: Married    Spouse Name: N/A  . Number of Children: N/A  . Years of Education: N/A   Occupational History  . Retired Scientist, clinical (histocompatibility and immunogenetics)    Social History Main Topics  . Smoking status: Former Research scientist (life sciences)  . Smokeless tobacco: Not on file  .  Alcohol Use: No  . Drug Use: No  . Sexual Activity: Not on file   Other Topics Concern  . Not on file   Social History Narrative   Married. Education: high school/other. Exercise: Yes.     Review of Systems  Constitutional: Positive for fatigue. Negative for diaphoresis.  Respiratory: Negative for cough.   Gastrointestinal: Negative for blood in stool.  Endocrine: Negative for polydipsia.  Genitourinary: Negative.  Negative for dysuria and hematuria.  Psychiatric/Behavioral: Negative for dysphoric mood.       Objective:   Physical Exam  Constitutional: He is  oriented to person, place, and time. He appears well-developed and well-nourished. No distress.  HENT:  Head: Normocephalic and atraumatic.  Mouth/Throat: Oropharynx is clear and moist. No oropharyngeal exudate.  Eyes: Pupils are equal, round, and reactive to light.  Neck: Neck supple. No thyroid mass and no thyromegaly present.  Cardiovascular: Normal rate and regular rhythm.   Grade [id number] systolic murmur.  Pulmonary/Chest: Effort normal.  Abdominal: Hernia confirmed negative in the right inguinal area and confirmed negative in the left inguinal area.  Genitourinary: Testes normal. Right testis shows no mass and no tenderness. Left testis shows no mass and no tenderness.  Musculoskeletal: He exhibits no edema.  Neurological: He is alert and oriented to person, place, and time. No cranial nerve deficit.  Skin: Skin is warm and dry. No rash noted.  Psychiatric: He has a normal mood and affect. His behavior is normal.  Nursing note and vitals reviewed.   Filed Vitals:   05/25/15 1316  BP: 126/80  Pulse: 72  Temp: 97.9 F (36.6 C)  TempSrc: Oral  Resp: 16  Height: 5\' 8"  (1.727 m)  Weight: 163 lb 12.8 oz (74.299 kg)  SpO2: 96%         Assessment & Plan:  Albert Hayes is a 71 y.o. male Decreased libido - Plan: Testosterone, CANCELED: Testosterone  Other fatigue - Plan: TSH, CBC, CANCELED: TSH, CANCELED: CBC  Erectile dysfunction, unspecified erectile dysfunction type - Plan: Testosterone, CANCELED: Testosterone  Falling hair - Plan: TSH, CANCELED: TSH  - prior ED with increasing sx's of fatigue and decreased libido. Possible hair falling out more than prior and questionable weight loss.   -will start with TSH, CBC and testosterone level in am.  If low testosterone - repeat at least once, then refer to urology for eval.   No orders of the defined types were placed in this encounter.   Patient Instructions  Return in the morning for blood work.  You should receive a call  or letter about your lab results within the next week to 10 days.  If testosterone is low, would need to repeat at least once to verify, and if low will refer you to urology to determine treatment and other testing (such as prostate test).  Return to the clinic or go to the nearest emergency room if any of your symptoms worsen or new symptoms occur.     I personally performed the services described in this documentation, which was scribed in my presence. The recorded information has been reviewed and considered, and addended by me as needed.

## 2015-05-26 ENCOUNTER — Other Ambulatory Visit (INDEPENDENT_AMBULATORY_CARE_PROVIDER_SITE_OTHER): Payer: Medicare Other

## 2015-05-26 DIAGNOSIS — L659 Nonscarring hair loss, unspecified: Secondary | ICD-10-CM | POA: Diagnosis not present

## 2015-05-26 DIAGNOSIS — R5383 Other fatigue: Secondary | ICD-10-CM

## 2015-05-26 DIAGNOSIS — R6882 Decreased libido: Secondary | ICD-10-CM

## 2015-05-26 DIAGNOSIS — N529 Male erectile dysfunction, unspecified: Secondary | ICD-10-CM

## 2015-05-26 LAB — CBC
HCT: 47.4 % (ref 39.0–52.0)
Hemoglobin: 16.7 g/dL (ref 13.0–17.0)
MCH: 31.1 pg (ref 26.0–34.0)
MCHC: 35.2 g/dL (ref 30.0–36.0)
MCV: 88.3 fL (ref 78.0–100.0)
MPV: 8.9 fL (ref 8.6–12.4)
PLATELETS: 206 10*3/uL (ref 150–400)
RBC: 5.37 MIL/uL (ref 4.22–5.81)
RDW: 13.9 % (ref 11.5–15.5)
WBC: 5.2 10*3/uL (ref 4.0–10.5)

## 2015-05-26 LAB — TESTOSTERONE: TESTOSTERONE: 664 ng/dL (ref 250–827)

## 2015-05-26 LAB — TSH: TSH: 2.68 m[IU]/L (ref 0.40–4.50)

## 2015-05-27 DIAGNOSIS — H25812 Combined forms of age-related cataract, left eye: Secondary | ICD-10-CM | POA: Diagnosis not present

## 2015-05-27 DIAGNOSIS — H2512 Age-related nuclear cataract, left eye: Secondary | ICD-10-CM | POA: Diagnosis not present

## 2015-09-29 DIAGNOSIS — K649 Unspecified hemorrhoids: Secondary | ICD-10-CM | POA: Diagnosis not present

## 2015-09-29 DIAGNOSIS — Z86010 Personal history of colonic polyps: Secondary | ICD-10-CM | POA: Diagnosis not present

## 2015-09-29 DIAGNOSIS — K5730 Diverticulosis of large intestine without perforation or abscess without bleeding: Secondary | ICD-10-CM | POA: Diagnosis not present

## 2015-10-14 ENCOUNTER — Ambulatory Visit (INDEPENDENT_AMBULATORY_CARE_PROVIDER_SITE_OTHER): Payer: Medicare Other | Admitting: Family Medicine

## 2015-10-14 VITALS — BP 122/69 | HR 69 | Temp 98.1°F | Resp 16 | Ht 68.0 in | Wt 167.0 lb

## 2015-10-14 DIAGNOSIS — Z114 Encounter for screening for human immunodeficiency virus [HIV]: Secondary | ICD-10-CM

## 2015-10-14 DIAGNOSIS — Z1159 Encounter for screening for other viral diseases: Secondary | ICD-10-CM | POA: Diagnosis not present

## 2015-10-14 DIAGNOSIS — E785 Hyperlipidemia, unspecified: Secondary | ICD-10-CM | POA: Diagnosis not present

## 2015-10-14 LAB — COMPLETE METABOLIC PANEL WITH GFR
ALBUMIN: 4.6 g/dL (ref 3.6–5.1)
ALK PHOS: 70 U/L (ref 40–115)
ALT: 15 U/L (ref 9–46)
AST: 20 U/L (ref 10–35)
BILIRUBIN TOTAL: 0.7 mg/dL (ref 0.2–1.2)
BUN: 13 mg/dL (ref 7–25)
CO2: 27 mmol/L (ref 20–31)
CREATININE: 0.89 mg/dL (ref 0.70–1.18)
Calcium: 9.3 mg/dL (ref 8.6–10.3)
Chloride: 99 mmol/L (ref 98–110)
GFR, Est Non African American: 87 mL/min (ref >=60)
GLUCOSE: 104 mg/dL — AB (ref 65–99)
Potassium: 5 mmol/L (ref 3.5–5.3)
Sodium: 137 mmol/L (ref 135–146)
TOTAL PROTEIN: 7 g/dL (ref 6.1–8.1)

## 2015-10-14 LAB — LIPID PANEL
Cholesterol: 209 mg/dL — ABNORMAL HIGH (ref 125–200)
HDL: 41 mg/dL (ref >=40)
LDL CALC: 147 mg/dL — AB (ref <130)
Total CHOL/HDL Ratio: 5.1 Ratio — ABNORMAL HIGH (ref <=5.0)
Triglycerides: 107 mg/dL (ref <150)
VLDL: 21 mg/dL (ref <30)

## 2015-10-14 LAB — HEPATITIS C ANTIBODY: HCV Ab: NEGATIVE

## 2015-10-14 LAB — HIV ANTIBODY (ROUTINE TESTING W REFLEX): HIV: NONREACTIVE

## 2015-10-14 NOTE — Progress Notes (Addendum)
By signing my name below, I, Mesha Guinyard, attest that this documentation has been prepared under the direction and in the presence of Merri Ray, MD.  Electronically Signed: Verlee Monte, Medical Scribe. 10/14/2015. 10:01 AM.  Subjective:    Patient ID: Albert Hayes, male    DOB: 1945-09-30, 70 y.o.   MRN: [id number]  HPI Chief Complaint  Patient presents with  . Labs Only    Cholesterol for cardiologist & Hep c test    HPI Comments: Albert Hayes is a 70 y.o. male who presents to the [facility] for labs. Pt is here for some blood test. Pt is followed by cardiology- recommended he have lipid testing. I last tested him Nov 2016. His doctor is Dr. Donnetta Hutching. Pt takes Livalo for HLD. Pt denies recieving blood transfusions, or IV drug use in the past. Lab Results  Component Value Date   CHOL 139 02/14/2015   HDL 41 02/14/2015   LDLCALC 76 02/14/2015   TRIG 109 02/14/2015   CHOLHDL 3.4 02/14/2015   Hep C Screening: Here for blood test. HIV Screening: Pt has never been screened but would like to.  Immunizations: Pt would like to update his tetanus shot, but doesn't want to receive it today. Pt has a 53 month old grandson. Immunization History  Administered Date(s) Administered  . Influenza-Unspecified 12/15/2013, 02/04/2015  . Pneumococcal Conjugate-13 04/20/2014  . Pneumococcal-Unspecified 01/07/2014  . Tdap 04/09/2008  . Zoster 06/07/2013   Patient Active Problem List   Diagnosis Date Noted  . Arteriosclerosis of coronary artery 12/22/2014  . BP (high blood pressure) 12/22/2014  . Chronic obstructive pulmonary disease ([place]) 12/22/2014  . H/O coronary artery bypass surgery 12/22/2014  . HLD (hyperlipidemia) 12/22/2014   Past Medical History  Diagnosis Date  . Anxiety    Past Surgical History  Procedure Laterality Date  . Coronary artery bypass graft    . Spine/bone surgery     Allergies  Allergen Reactions  . Bupropion   . Fluoxetine     . Fluvoxamine   . Penicillins     severely allergic  . Tamsulosin    Prior to Admission medications   Medication Sig Start Date End Date Taking? Authorizing Provider  aspirin 81 MG tablet Take 81 mg by mouth daily.   Yes Historical Provider, MD  gabapentin (NEURONTIN) 100 MG capsule Take 100 mg by mouth 3 (three) times daily.   Yes Historical Provider, MD  Pitavastatin Calcium (LIVALO) 2 MG TABS Take by mouth. Reported on 05/25/2015   Yes Historical Provider, MD  ramipril (ALTACE) 2.5 MG capsule Take 2.5 mg by mouth daily.   Yes Historical Provider, MD  sildenafil (REVATIO) 20 MG tablet 1-2 tablets prior to onset of sexual activity. 02/14/15  Yes Wendie Agreste, MD   Social History   Social History  . Marital Status: Married    Spouse Name: N/A  . Number of Children: N/A  . Years of Education: N/A   Occupational History  . Retired Scientist, clinical (histocompatibility and immunogenetics)    Social History Main Topics  . Smoking status: Former Research scientist (life sciences)  . Smokeless tobacco: Not on file  . Alcohol Use: No  . Drug Use: No  . Sexual Activity: Not on file   Other Topics Concern  . Not on file   Social History Narrative   Married. Education: high school/other. Exercise: Yes.   Review of Systems  Gastrointestinal: Negative for abdominal pain.  Musculoskeletal: Negative for arthralgias.     Objective:  BP 122/69 mmHg  Pulse 69  Temp(Src) 98.1 F (36.7 C) (Oral)  Resp 16  Ht 5\' 8"  (1.727 m)  Wt 167 lb (75.751 kg)  BMI 25.40 kg/m2  SpO2 95%  Physical Exam  Constitutional: He is oriented to person, place, and time. He appears well-developed and well-nourished.  HENT:  Head: Normocephalic and atraumatic.  Eyes: EOM are normal. Pupils are equal, round, and reactive to light.  Neck: No JVD present. Carotid bruit is not present.  Cardiovascular: Normal rate, regular rhythm and normal heart sounds.   No murmur heard. Pulmonary/Chest: Effort normal and breath sounds normal. He has no rales.  Musculoskeletal: He  exhibits no edema.  Neurological: He is alert and oriented to person, place, and time.  Skin: Skin is warm and dry.  Psychiatric: He has a normal mood and affect.  Vitals reviewed.   Assessment & Plan:   LANSING AGRAWAL is a 70 y.o. male Hyperlipidemia - Plan: COMPLETE METABOLIC PANEL WITH GFR, Lipid panel  - Managed by cardiology. Continue Livalo, check CMP, lipid panel.  Need for hepatitis C screening test - Plan: Hepatitis C antibody  Screening for HIV (human immunodeficiency virus) - Plan: HIV antibody  Discussed tenderness, not due for routine purposes until 20/20, but with grandchild, consider Tdap. Can return at his convenience if he desires this.  No orders of the defined types were placed in this encounter.   Patient Instructions       IF you received an x-ray today, you will receive an invoice from [facility]. Please contact [facility] at [phone] with questions or concerns regarding your invoice.   IF you received labwork today, you will receive an invoice from [employer]. Please contact [facility] at [phone] with questions or concerns regarding your invoice.   Our billing staff will not be able to assist you with questions regarding bills from these companies.  You will be contacted with the lab results as soon as they are available. The fastest way to get your results is to activate your My Chart account. Instructions are located on the last page of this paperwork. If you have not heard from [place] regarding the results in 2 weeks, please contact this office.    If you decide to have tetanus vaccine, can return to have that given at your convenience. You're not due until 2020 for routine immunization based on my records.      I personally performed the services described in this documentation, which was scribed in my presence. The recorded information has been reviewed and considered, and addended by me as  needed.   Signed,   Merri Ray, MD [facility].  10/14/2015 10:13 AM

## 2015-10-14 NOTE — Patient Instructions (Addendum)
     IF you received an x-ray today, you will receive an invoice from [facility]. Please contact [facility] at [phone] with questions or concerns regarding your invoice.   IF you received labwork today, you will receive an invoice from [employer]. Please contact [facility] at [phone] with questions or concerns regarding your invoice.   Our billing staff will not be able to assist you with questions regarding bills from these companies.  You will be contacted with the lab results as soon as they are available. The fastest way to get your results is to activate your My Chart account. Instructions are located on the last page of this paperwork. If you have not heard from [place] regarding the results in 2 weeks, please contact this office.    If you decide to have tetanus vaccine, can return to have that given at your convenience. You're not due until 2020 for routine immunization based on my records.

## 2015-10-21 DIAGNOSIS — M999 Biomechanical lesion, unspecified: Secondary | ICD-10-CM | POA: Insufficient documentation

## 2015-10-21 DIAGNOSIS — M531 Cervicobrachial syndrome: Secondary | ICD-10-CM | POA: Insufficient documentation

## 2015-10-21 DIAGNOSIS — M545 Low back pain, unspecified: Secondary | ICD-10-CM | POA: Insufficient documentation

## 2015-10-21 DIAGNOSIS — IMO0002 Reserved for concepts with insufficient information to code with codable children: Secondary | ICD-10-CM | POA: Insufficient documentation

## 2015-10-21 DIAGNOSIS — M542 Cervicalgia: Secondary | ICD-10-CM | POA: Insufficient documentation

## 2015-10-21 DIAGNOSIS — M539 Dorsopathy, unspecified: Secondary | ICD-10-CM | POA: Insufficient documentation

## 2015-10-24 DIAGNOSIS — Z951 Presence of aortocoronary bypass graft: Secondary | ICD-10-CM | POA: Diagnosis not present

## 2015-10-24 DIAGNOSIS — E119 Type 2 diabetes mellitus without complications: Secondary | ICD-10-CM | POA: Diagnosis not present

## 2015-10-24 DIAGNOSIS — I10 Essential (primary) hypertension: Secondary | ICD-10-CM | POA: Diagnosis not present

## 2015-10-24 DIAGNOSIS — E785 Hyperlipidemia, unspecified: Secondary | ICD-10-CM | POA: Diagnosis not present

## 2015-10-24 DIAGNOSIS — I2510 Atherosclerotic heart disease of native coronary artery without angina pectoris: Secondary | ICD-10-CM | POA: Diagnosis not present

## 2015-11-02 DIAGNOSIS — K5904 Chronic idiopathic constipation: Secondary | ICD-10-CM

## 2015-11-02 DIAGNOSIS — Z86010 Personal history of colonic polyps: Secondary | ICD-10-CM | POA: Diagnosis not present

## 2015-11-02 HISTORY — DX: Chronic idiopathic constipation: K59.04

## 2015-11-16 DIAGNOSIS — K5900 Constipation, unspecified: Secondary | ICD-10-CM | POA: Diagnosis not present

## 2016-02-16 DIAGNOSIS — E785 Hyperlipidemia, unspecified: Secondary | ICD-10-CM | POA: Diagnosis not present

## 2016-07-31 DIAGNOSIS — H539 Unspecified visual disturbance: Secondary | ICD-10-CM | POA: Insufficient documentation

## 2016-07-31 DIAGNOSIS — N529 Male erectile dysfunction, unspecified: Secondary | ICD-10-CM | POA: Insufficient documentation

## 2016-07-31 DIAGNOSIS — I2510 Atherosclerotic heart disease of native coronary artery without angina pectoris: Secondary | ICD-10-CM | POA: Diagnosis not present

## 2016-07-31 DIAGNOSIS — K5900 Constipation, unspecified: Secondary | ICD-10-CM | POA: Insufficient documentation

## 2016-08-20 DIAGNOSIS — K5900 Constipation, unspecified: Secondary | ICD-10-CM | POA: Diagnosis not present

## 2016-08-20 DIAGNOSIS — I10 Essential (primary) hypertension: Secondary | ICD-10-CM | POA: Diagnosis not present

## 2016-08-22 DIAGNOSIS — Z86010 Personal history of colonic polyps: Secondary | ICD-10-CM | POA: Diagnosis not present

## 2016-08-22 DIAGNOSIS — K5904 Chronic idiopathic constipation: Secondary | ICD-10-CM | POA: Diagnosis not present

## 2016-08-30 DIAGNOSIS — D492 Neoplasm of unspecified behavior of bone, soft tissue, and skin: Secondary | ICD-10-CM | POA: Diagnosis not present

## 2016-08-30 DIAGNOSIS — L298 Other pruritus: Secondary | ICD-10-CM | POA: Diagnosis not present

## 2016-08-30 DIAGNOSIS — L820 Inflamed seborrheic keratosis: Secondary | ICD-10-CM | POA: Diagnosis not present

## 2016-08-30 DIAGNOSIS — L720 Epidermal cyst: Secondary | ICD-10-CM | POA: Diagnosis not present

## 2016-08-30 DIAGNOSIS — L570 Actinic keratosis: Secondary | ICD-10-CM | POA: Diagnosis not present

## 2016-09-28 DIAGNOSIS — Z0000 Encounter for general adult medical examination without abnormal findings: Secondary | ICD-10-CM | POA: Diagnosis not present

## 2016-09-28 DIAGNOSIS — Z125 Encounter for screening for malignant neoplasm of prostate: Secondary | ICD-10-CM | POA: Diagnosis not present

## 2016-09-28 DIAGNOSIS — R5383 Other fatigue: Secondary | ICD-10-CM | POA: Diagnosis not present

## 2016-09-28 DIAGNOSIS — M79675 Pain in left toe(s): Secondary | ICD-10-CM | POA: Diagnosis not present

## 2016-10-03 DIAGNOSIS — G4733 Obstructive sleep apnea (adult) (pediatric): Secondary | ICD-10-CM | POA: Diagnosis not present

## 2016-10-11 DIAGNOSIS — M545 Low back pain: Secondary | ICD-10-CM | POA: Diagnosis not present

## 2016-10-11 DIAGNOSIS — K5904 Chronic idiopathic constipation: Secondary | ICD-10-CM | POA: Diagnosis not present

## 2016-10-11 DIAGNOSIS — Z86010 Personal history of colonic polyps: Secondary | ICD-10-CM | POA: Diagnosis not present

## 2016-10-11 DIAGNOSIS — K5900 Constipation, unspecified: Secondary | ICD-10-CM | POA: Diagnosis not present

## 2016-10-17 DIAGNOSIS — G4733 Obstructive sleep apnea (adult) (pediatric): Secondary | ICD-10-CM | POA: Diagnosis not present

## 2016-10-18 DIAGNOSIS — G4733 Obstructive sleep apnea (adult) (pediatric): Secondary | ICD-10-CM | POA: Diagnosis not present

## 2016-10-18 DIAGNOSIS — R1310 Dysphagia, unspecified: Secondary | ICD-10-CM

## 2016-10-18 HISTORY — DX: Dysphagia, unspecified: R13.10

## 2016-10-24 DIAGNOSIS — E119 Type 2 diabetes mellitus without complications: Secondary | ICD-10-CM | POA: Diagnosis not present

## 2016-10-24 DIAGNOSIS — I2510 Atherosclerotic heart disease of native coronary artery without angina pectoris: Secondary | ICD-10-CM | POA: Diagnosis not present

## 2016-10-24 DIAGNOSIS — M79675 Pain in left toe(s): Secondary | ICD-10-CM | POA: Diagnosis not present

## 2016-10-24 DIAGNOSIS — E785 Hyperlipidemia, unspecified: Secondary | ICD-10-CM | POA: Diagnosis not present

## 2016-10-24 DIAGNOSIS — M898X9 Other specified disorders of bone, unspecified site: Secondary | ICD-10-CM | POA: Diagnosis not present

## 2016-10-24 DIAGNOSIS — M2062 Acquired deformities of toe(s), unspecified, left foot: Secondary | ICD-10-CM | POA: Diagnosis not present

## 2016-10-24 DIAGNOSIS — Z951 Presence of aortocoronary bypass graft: Secondary | ICD-10-CM | POA: Diagnosis not present

## 2016-10-30 ENCOUNTER — Encounter: Payer: Self-pay | Admitting: *Deleted

## 2016-10-31 ENCOUNTER — Ambulatory Visit: Payer: Medicare Other | Admitting: Anesthesiology

## 2016-10-31 ENCOUNTER — Encounter: Payer: Self-pay | Admitting: Anesthesiology

## 2016-10-31 ENCOUNTER — Encounter
Admission: RE | Disposition: A | Discharge status: Home / Self Care | Payer: Self-pay | Source: Ambulatory Visit | Attending: Unknown Physician Specialty

## 2016-10-31 ENCOUNTER — Ambulatory Visit
Admission: RE | Admit: 2016-10-31 | Discharge: 2016-10-31 | Disposition: A | Discharge status: Home / Self Care | Payer: Medicare Other | Source: Ambulatory Visit | Attending: Unknown Physician Specialty | Admitting: Unknown Physician Specialty

## 2016-10-31 DIAGNOSIS — Z86010 Personal history of colonic polyps: Secondary | ICD-10-CM | POA: Insufficient documentation

## 2016-10-31 DIAGNOSIS — F329 Major depressive disorder, single episode, unspecified: Secondary | ICD-10-CM | POA: Insufficient documentation

## 2016-10-31 DIAGNOSIS — N400 Enlarged prostate without lower urinary tract symptoms: Secondary | ICD-10-CM | POA: Diagnosis not present

## 2016-10-31 DIAGNOSIS — G4730 Sleep apnea, unspecified: Secondary | ICD-10-CM | POA: Insufficient documentation

## 2016-10-31 DIAGNOSIS — K5790 Diverticulosis of intestine, part unspecified, without perforation or abscess without bleeding: Secondary | ICD-10-CM | POA: Diagnosis not present

## 2016-10-31 DIAGNOSIS — E119 Type 2 diabetes mellitus without complications: Secondary | ICD-10-CM | POA: Diagnosis not present

## 2016-10-31 DIAGNOSIS — D122 Benign neoplasm of ascending colon: Secondary | ICD-10-CM | POA: Diagnosis not present

## 2016-10-31 DIAGNOSIS — Z79899 Other long term (current) drug therapy: Secondary | ICD-10-CM | POA: Diagnosis not present

## 2016-10-31 DIAGNOSIS — K219 Gastro-esophageal reflux disease without esophagitis: Secondary | ICD-10-CM | POA: Diagnosis not present

## 2016-10-31 DIAGNOSIS — J449 Chronic obstructive pulmonary disease, unspecified: Secondary | ICD-10-CM | POA: Diagnosis not present

## 2016-10-31 DIAGNOSIS — D123 Benign neoplasm of transverse colon: Secondary | ICD-10-CM | POA: Diagnosis not present

## 2016-10-31 DIAGNOSIS — Z87891 Personal history of nicotine dependence: Secondary | ICD-10-CM | POA: Diagnosis not present

## 2016-10-31 DIAGNOSIS — E559 Vitamin D deficiency, unspecified: Secondary | ICD-10-CM | POA: Diagnosis not present

## 2016-10-31 DIAGNOSIS — I2510 Atherosclerotic heart disease of native coronary artery without angina pectoris: Secondary | ICD-10-CM | POA: Insufficient documentation

## 2016-10-31 DIAGNOSIS — K648 Other hemorrhoids: Secondary | ICD-10-CM | POA: Diagnosis not present

## 2016-10-31 DIAGNOSIS — K635 Polyp of colon: Secondary | ICD-10-CM | POA: Diagnosis not present

## 2016-10-31 DIAGNOSIS — E785 Hyperlipidemia, unspecified: Secondary | ICD-10-CM | POA: Diagnosis not present

## 2016-10-31 DIAGNOSIS — F4310 Post-traumatic stress disorder, unspecified: Secondary | ICD-10-CM | POA: Diagnosis not present

## 2016-10-31 DIAGNOSIS — I10 Essential (primary) hypertension: Secondary | ICD-10-CM | POA: Diagnosis not present

## 2016-10-31 DIAGNOSIS — Z951 Presence of aortocoronary bypass graft: Secondary | ICD-10-CM | POA: Insufficient documentation

## 2016-10-31 DIAGNOSIS — Z7982 Long term (current) use of aspirin: Secondary | ICD-10-CM | POA: Insufficient documentation

## 2016-10-31 HISTORY — DX: Personal history of colonic polyps: Z86.010

## 2016-10-31 HISTORY — DX: Atherosclerotic heart disease of native coronary artery without angina pectoris: I25.10

## 2016-10-31 HISTORY — DX: Vitamin D deficiency, unspecified: E55.9

## 2016-10-31 HISTORY — PX: COLONOSCOPY WITH PROPOFOL: SHX5780

## 2016-10-31 HISTORY — DX: Low back pain, unspecified: M54.50

## 2016-10-31 HISTORY — DX: Personal history of colon polyps, unspecified: Z86.0100

## 2016-10-31 HISTORY — DX: Hyperlipidemia, unspecified: E78.5

## 2016-10-31 HISTORY — DX: Thrombocytopenia, unspecified: D69.6

## 2016-10-31 HISTORY — DX: Unspecified asthma, uncomplicated: J45.909

## 2016-10-31 HISTORY — DX: Cardiac murmur, unspecified: R01.1

## 2016-10-31 HISTORY — DX: Sleep apnea, unspecified: G47.30

## 2016-10-31 HISTORY — DX: Essential (primary) hypertension: I10

## 2016-10-31 HISTORY — DX: Gastro-esophageal reflux disease without esophagitis: K21.9

## 2016-10-31 HISTORY — DX: Low back pain: M54.5

## 2016-10-31 HISTORY — DX: Male erectile dysfunction, unspecified: N52.9

## 2016-10-31 HISTORY — DX: Constipation, unspecified: K59.00

## 2016-10-31 HISTORY — DX: Type 2 diabetes mellitus without complications: E11.9

## 2016-10-31 HISTORY — DX: Post-traumatic stress disorder, unspecified: F43.10

## 2016-10-31 HISTORY — DX: Major depressive disorder, single episode, unspecified: F32.9

## 2016-10-31 HISTORY — DX: Chronic obstructive pulmonary disease, unspecified: J44.9

## 2016-10-31 HISTORY — DX: Benign prostatic hyperplasia without lower urinary tract symptoms: N40.0

## 2016-10-31 HISTORY — DX: Conduct disorder, unspecified: F91.9

## 2016-10-31 LAB — HM COLONOSCOPY

## 2016-10-31 SURGERY — COLONOSCOPY WITH PROPOFOL
Anesthesia: General

## 2016-10-31 MED ORDER — EPHEDRINE SULFATE 50 MG/ML IJ SOLN
INTRAMUSCULAR | Status: DC | PRN
  Administered 2016-10-31: 5 mg via INTRAVENOUS

## 2016-10-31 MED ORDER — FENTANYL CITRATE (PF) 100 MCG/2ML IJ SOLN
INTRAMUSCULAR | Status: DC | PRN
  Administered 2016-10-31: 50 ug via INTRAVENOUS

## 2016-10-31 MED ORDER — MIDAZOLAM HCL 5 MG/5ML IJ SOLN
INTRAMUSCULAR | Status: DC | PRN
  Administered 2016-10-31: 1 mg via INTRAVENOUS

## 2016-10-31 MED ORDER — MIDAZOLAM HCL 2 MG/2ML IJ SOLN
INTRAMUSCULAR | Status: AC
  Filled 2016-10-31: qty 2

## 2016-10-31 MED ORDER — LIDOCAINE 2% (20 MG/ML) 5 ML SYRINGE
INTRAMUSCULAR | Status: DC | PRN
  Administered 2016-10-31: 30 mg via INTRAVENOUS

## 2016-10-31 MED ORDER — SODIUM CHLORIDE 0.9 % IV SOLN
INTRAVENOUS | Status: DC
  Administered 2016-10-31: 09:00:00 via INTRAVENOUS

## 2016-10-31 MED ORDER — PROPOFOL 10 MG/ML IV BOLUS
INTRAVENOUS | Status: DC | PRN
  Administered 2016-10-31: 100 mg via INTRAVENOUS

## 2016-10-31 MED ORDER — SODIUM CHLORIDE 0.9 % IV SOLN: INTRAVENOUS | Status: DC

## 2016-10-31 MED ORDER — PROPOFOL 500 MG/50ML IV EMUL
INTRAVENOUS | Status: AC
  Filled 2016-10-31: qty 50

## 2016-10-31 MED ORDER — PROPOFOL 500 MG/50ML IV EMUL
INTRAVENOUS | Status: DC | PRN
  Administered 2016-10-31: 140 ug/kg/min via INTRAVENOUS

## 2016-10-31 MED ORDER — FENTANYL CITRATE (PF) 100 MCG/2ML IJ SOLN
INTRAMUSCULAR | Status: AC
  Filled 2016-10-31: qty 2

## 2016-10-31 MED ORDER — PHENYLEPHRINE HCL 10 MG/ML IJ SOLN
INTRAMUSCULAR | Status: DC | PRN
  Administered 2016-10-31 (×4): 100 ug via INTRAVENOUS

## 2016-10-31 NOTE — Anesthesia Postprocedure Evaluation (Signed)
Anesthesia Post Note  Patient: Albert Hayes  Procedure(s) Performed: Procedure(s) (LRB): COLONOSCOPY WITH PROPOFOL (N/A)  Patient location during evaluation: Endoscopy Anesthesia Type: General Level of consciousness: awake and alert Pain management: pain level controlled Vital Signs Assessment: post-procedure vital signs reviewed and stable Respiratory status: spontaneous breathing, nonlabored ventilation, respiratory function stable and patient connected to nasal cannula oxygen Cardiovascular status: blood pressure returned to baseline and stable Postop Assessment: no signs of nausea or vomiting Anesthetic complications: no     Last Vitals:  Vitals:   10/31/16 1000 10/31/16 1010  BP: 111/64 123/65  Pulse: (!) 57 62  Resp: 18 14  Temp:      Last Pain:  Vitals:   10/31/16 0837  TempSrc: Tympanic                 Martha Clan

## 2016-10-31 NOTE — Transfer of Care (Signed)
Immediate Anesthesia Transfer of Care Note  Patient: Albert Hayes  Procedure(s) Performed: Procedure(s): COLONOSCOPY WITH PROPOFOL (N/A)  Patient Location: PACU and Endoscopy Unit  Anesthesia Type:General  Level of Consciousness: sedated  Airway & Oxygen Therapy: Patient Spontanous Breathing and Patient connected to nasal cannula oxygen  Post-op Assessment: Report given to RN and Post -op Vital signs reviewed and stable  Post vital signs: Reviewed and stable  Last Vitals:  Vitals:   10/31/16 0837  BP: 133/70  Pulse: 63  Resp: 18  Temp: (!) 36.2 C    Last Pain:  Vitals:   10/31/16 0837  TempSrc: Tympanic         Complications: No apparent anesthesia complications

## 2016-10-31 NOTE — Anesthesia Post-op Follow-up Note (Cosign Needed)
Anesthesia QCDR form completed.        

## 2016-10-31 NOTE — Op Note (Signed)
[facility] Gastroenterology Patient Name: Albert Hayes Procedure Date: 10/31/2016 8:55 AM MRN: [id number] Account #: [id number] Date of Birth: Dec 03, 1945 Admit Type: Outpatient Age: 71 Room: [facility] ENDO ROOM 3 Gender: Male Note Status: Finalized Procedure:            Colonoscopy Indications:          Personal history of colonic polyps Providers:            Manya Silvas, MD Referring MD:         Ranell Patrick. Carlota Raspberry, MD (Referring MD) Medicines:            Propofol per Anesthesia Complications:        No immediate complications. Procedure:            Pre-Anesthesia Assessment:                       - After reviewing the risks and benefits, the patient                        was deemed in satisfactory condition to undergo the                        procedure.                       After obtaining informed consent, the colonoscope was                        passed under direct vision. Throughout the procedure,                        the patient's blood pressure, pulse, and oxygen                        saturations were monitored continuously. The                        Colonoscope was introduced through the anus and                        advanced to the the cecum, identified by appendiceal                        orifice and ileocecal valve. The colonoscopy was                        performed without difficulty. The patient tolerated the                        procedure well. The quality of the bowel preparation                        was good. Findings:      A 10 mm polyp was found in the ascending colon. The polyp was sessile.       The polyp was removed with a hot snare. Resection and retrieval were       complete. To prevent bleeding after the polypectomy, one hemostatic clip       was successfully placed. There was no bleeding at the end of the       procedure.  Two sessile polyps were found in the transverse colon. The polyps were       diminutive in  size. These polyps were removed with a jumbo cold forceps.       Resection and retrieval were complete. Impression:           - One 10 mm polyp in the ascending colon, removed with                        a hot snare. Resected and retrieved. Clip was placed.                       - Two diminutive polyps in the transverse colon,                        removed with a jumbo cold forceps. Resected and                        retrieved. Recommendation:       - Await pathology results. Manya Silvas, MD 10/31/2016 9:38:13 AM This report has been signed electronically. Number of Addenda: 0 Note Initiated On: 10/31/2016 8:55 AM Scope Withdrawal Time: 0 hours 16 minutes 16 seconds  Total Procedure Duration: 0 hours 26 minutes 21 seconds       [facility]

## 2016-10-31 NOTE — H&P (Signed)
Primary Care Physician:  Wendie Agreste, MD Primary Gastroenterologist:  Dr. Vira Agar  Pre-Procedure History & Physical: HPI:  Albert Hayes is a 71 y.o. male is here for an colonoscopy.   Past Medical History:  Diagnosis Date  . Anxiety   . Asthma   . BPH (benign prostatic hyperplasia)   . Conduct disorder   . Constipation   . COPD (chronic obstructive pulmonary disease) ([place])   . Coronary artery disease   . Depression   . Diabetes mellitus without complication ([place])   . ED (erectile dysfunction)   . GERD (gastroesophageal reflux disease)   . Heart murmur    EJECTION MURMUR  . History of colon polyps   . HLD (hyperlipidemia)   . Hypertension   . Low back pain   . PTSD (post-traumatic stress disorder)   . Sleep apnea   . Thrombocytopenia ([place])   . Vitamin D deficiency     Past Surgical History:  Procedure Laterality Date  . COLONOSCOPY    . CORONARY ANGIOPLASTY    . CORONARY ARTERY BYPASS GRAFT    . ESOPHAGOGASTRODUODENOSCOPY  2016  . SEPTOPLASTY    . spine/bone surgery    . TONSILLECTOMY      Prior to Admission medications   Medication Sig Start Date End Date Taking? Authorizing Provider  aspirin 81 MG tablet Take 81 mg by mouth daily.   Yes [provider]  bisacodyl (DULCOLAX) 5 MG EC tablet Take 5 mg by mouth daily as needed for moderate constipation.   Yes [provider]  clonazePAM (KLONOPIN) 1 MG tablet Take 1 mg by mouth at bedtime.   Yes [provider]  gabapentin (NEURONTIN) 100 MG capsule Take 100 mg by mouth 3 (three) times daily.   Yes [provider]  linaclotide (LINZESS) 145 MCG CAPS capsule Take 145 mcg by mouth daily before breakfast.   Yes [provider]  Pitavastatin Calcium (LIVALO) 2 MG TABS Take by mouth. Reported on 05/25/2015   Yes [provider]  ramipril (ALTACE) 2.5 MG capsule Take 2.5 mg by mouth daily.   Yes [provider]  sildenafil (REVATIO) 20 MG tablet 1-2  tablets prior to onset of sexual activity. 02/14/15  Yes Wendie Agreste, MD  venlafaxine (EFFEXOR) 37.5 MG tablet Take 37.5 mg by mouth daily.   Yes [provider]    Allergies as of 09/11/2016 - Review Complete 10/14/2015  Allergen Reaction Noted  . Bupropion  02/14/2015  . Fluoxetine  02/14/2015  . Fluvoxamine  02/14/2015  . Penicillins  02/14/2015  . Tamsulosin  02/14/2015    Family History  Problem Relation Age of Onset  . Cancer Father        Lung cancer    Social History   Social History  . Marital status: Married    Spouse name: N/A  . Number of children: N/A  . Years of education: N/A   Occupational History  . Retired Scientist, clinical (histocompatibility and immunogenetics)    Social History Main Topics  . Smoking status: Former Smoker    Quit date: 10/30/1988  . Smokeless tobacco: Never Used  . Alcohol use No  . Drug use: No  . Sexual activity: Not on file   Other Topics Concern  . Not on file   Social History Narrative   Married. Education: high school/other. Exercise: Yes.    Review of Systems: See HPI, otherwise negative ROS  Physical Exam: BP 133/70   Pulse 63   Temp Marland Kitchen)  97.1 F (36.2 C) (Tympanic)   Resp 18   Ht 5\' 7"  (1.702 m)   Wt 78 kg (172 lb)   SpO2 100%   BMI 26.94 kg/m  General:   Alert,  pleasant and cooperative in NAD Head:  Normocephalic and atraumatic. Neck:  Supple; no masses or thyromegaly. Lungs:  Clear throughout to auscultation.    Heart:  Regular rate and rhythm.  1/6 systolic. Abdomen:  Soft, nontender and nondistended. Normal bowel sounds, without guarding, and without rebound.   Neurologic:  Alert and  oriented x4;  grossly normal neurologically.  Impression/Plan: BRONCO MCGRORY is here for an colonoscopy to be performed for [facility] of colon polyps.  Risks, benefits, limitations, and alternatives regarding  colonoscopy have been reviewed with the patient.  Questions have been answered.  All parties agreeable.   Gaylyn Cheers, MD  10/31/2016,  8:56 AM

## 2016-10-31 NOTE — Anesthesia Preprocedure Evaluation (Signed)
Anesthesia Evaluation  Patient identified by MRN, date of birth, ID band Patient awake    Reviewed: Allergy & Precautions, H&P , NPO status , Patient's Chart, lab work & pertinent test results, reviewed documented beta blocker date and time   History of Anesthesia Complications Negative for: history of anesthetic complications  Airway Mallampati: II  TM Distance: >3 FB Neck ROM: full    Dental  (+) Dental Advidsory Given   Pulmonary neg shortness of breath, asthma , sleep apnea , COPD, former smoker,           Cardiovascular Exercise Tolerance: Good hypertension, (-) angina+ CAD, + Cardiac Stents and + CABG  (-) Past MI (-) dysrhythmias + Valvular Problems/Murmurs      Neuro/Psych PSYCHIATRIC DISORDERS (Depression and PTSD) negative neurological ROS     GI/Hepatic Neg liver ROS, GERD  ,  Endo/Other  diabetes  Renal/GU negative Renal ROS  negative genitourinary   Musculoskeletal   Abdominal   Peds  Hematology negative hematology ROS (+)   Anesthesia Other Findings Past Medical History: No date: Anxiety No date: Asthma No date: BPH (benign prostatic hyperplasia) No date: Conduct disorder No date: Constipation No date: COPD (chronic obstructive pulmonary disease) (HCC) No date: Coronary artery disease No date: Depression No date: Diabetes mellitus without complication (HCC) No date: ED (erectile dysfunction) No date: GERD (gastroesophageal reflux disease) No date: Heart murmur     Comment:  EJECTION MURMUR No date: History of colon polyps No date: HLD (hyperlipidemia) No date: Hypertension No date: Low back pain No date: PTSD (post-traumatic stress disorder) No date: Sleep apnea No date: Thrombocytopenia ([place]) No date: Vitamin D deficiency   Reproductive/Obstetrics negative OB ROS                             Anesthesia Physical Anesthesia Plan  ASA: III  Anesthesia Plan:  General   Post-op Pain Management:    Induction: Intravenous  PONV Risk Score and Plan: 2 and Propofol  Airway Management Planned: Natural Airway and Nasal Cannula  Additional Equipment:   Intra-op Plan:   Post-operative Plan:   Informed Consent: I have reviewed the patients History and Physical, chart, labs and discussed the procedure including the risks, benefits and alternatives for the proposed anesthesia with the patient or authorized representative who has indicated his/her understanding and acceptance.   Dental Advisory Given  Plan Discussed with: Anesthesiologist, CRNA and Surgeon  Anesthesia Plan Comments:         Anesthesia Quick Evaluation

## 2016-11-01 ENCOUNTER — Encounter: Payer: Self-pay | Admitting: Unknown Physician Specialty

## 2016-11-01 DIAGNOSIS — G4733 Obstructive sleep apnea (adult) (pediatric): Secondary | ICD-10-CM | POA: Diagnosis not present

## 2016-11-02 LAB — SURGICAL PATHOLOGY

## 2016-11-06 ENCOUNTER — Other Ambulatory Visit: Payer: Self-pay | Admitting: Student

## 2016-11-06 DIAGNOSIS — M545 Low back pain: Secondary | ICD-10-CM | POA: Diagnosis not present

## 2016-11-06 DIAGNOSIS — G8929 Other chronic pain: Secondary | ICD-10-CM | POA: Diagnosis not present

## 2016-11-06 DIAGNOSIS — R29898 Other symptoms and signs involving the musculoskeletal system: Secondary | ICD-10-CM | POA: Diagnosis not present

## 2016-11-06 DIAGNOSIS — M47812 Spondylosis without myelopathy or radiculopathy, cervical region: Secondary | ICD-10-CM | POA: Diagnosis not present

## 2016-11-14 ENCOUNTER — Ambulatory Visit
Admission: RE | Admit: 2016-11-14 | Discharge: 2016-11-14 | Disposition: A | Discharge status: Home / Self Care | Payer: Medicare Other | Source: Ambulatory Visit | Attending: Student | Admitting: Student

## 2016-11-14 DIAGNOSIS — M5126 Other intervertebral disc displacement, lumbar region: Secondary | ICD-10-CM | POA: Diagnosis not present

## 2016-11-14 DIAGNOSIS — M4802 Spinal stenosis, cervical region: Secondary | ICD-10-CM | POA: Insufficient documentation

## 2016-11-14 DIAGNOSIS — M48061 Spinal stenosis, lumbar region without neurogenic claudication: Secondary | ICD-10-CM | POA: Diagnosis not present

## 2016-11-14 DIAGNOSIS — M47812 Spondylosis without myelopathy or radiculopathy, cervical region: Secondary | ICD-10-CM | POA: Insufficient documentation

## 2016-11-14 DIAGNOSIS — G8929 Other chronic pain: Secondary | ICD-10-CM | POA: Insufficient documentation

## 2016-11-14 DIAGNOSIS — R29898 Other symptoms and signs involving the musculoskeletal system: Secondary | ICD-10-CM | POA: Insufficient documentation

## 2016-11-14 DIAGNOSIS — M545 Low back pain: Secondary | ICD-10-CM

## 2016-11-14 DIAGNOSIS — M5021 Other cervical disc displacement,  high cervical region: Secondary | ICD-10-CM | POA: Diagnosis not present

## 2016-11-30 DIAGNOSIS — R5383 Other fatigue: Secondary | ICD-10-CM | POA: Diagnosis not present

## 2016-11-30 DIAGNOSIS — K5900 Constipation, unspecified: Secondary | ICD-10-CM | POA: Diagnosis not present

## 2016-11-30 DIAGNOSIS — I10 Essential (primary) hypertension: Secondary | ICD-10-CM | POA: Diagnosis not present

## 2016-12-02 DIAGNOSIS — G4733 Obstructive sleep apnea (adult) (pediatric): Secondary | ICD-10-CM | POA: Diagnosis not present

## 2016-12-07 DIAGNOSIS — G4733 Obstructive sleep apnea (adult) (pediatric): Secondary | ICD-10-CM | POA: Diagnosis not present

## 2016-12-19 DIAGNOSIS — I10 Essential (primary) hypertension: Secondary | ICD-10-CM | POA: Diagnosis not present

## 2016-12-19 DIAGNOSIS — R590 Localized enlarged lymph nodes: Secondary | ICD-10-CM | POA: Diagnosis not present

## 2016-12-19 DIAGNOSIS — G4733 Obstructive sleep apnea (adult) (pediatric): Secondary | ICD-10-CM | POA: Diagnosis not present

## 2017-01-02 DIAGNOSIS — G4733 Obstructive sleep apnea (adult) (pediatric): Secondary | ICD-10-CM | POA: Diagnosis not present

## 2017-01-04 DIAGNOSIS — G4733 Obstructive sleep apnea (adult) (pediatric): Secondary | ICD-10-CM | POA: Diagnosis not present

## 2017-01-22 DIAGNOSIS — H16223 Keratoconjunctivitis sicca, not specified as Sjogren's, bilateral: Secondary | ICD-10-CM | POA: Diagnosis not present

## 2017-02-25 DIAGNOSIS — I10 Essential (primary) hypertension: Secondary | ICD-10-CM | POA: Diagnosis not present

## 2017-02-25 DIAGNOSIS — G4730 Sleep apnea, unspecified: Secondary | ICD-10-CM | POA: Diagnosis not present

## 2017-02-25 DIAGNOSIS — I2510 Atherosclerotic heart disease of native coronary artery without angina pectoris: Secondary | ICD-10-CM | POA: Diagnosis not present

## 2017-03-04 DIAGNOSIS — H16223 Keratoconjunctivitis sicca, not specified as Sjogren's, bilateral: Secondary | ICD-10-CM | POA: Diagnosis not present

## 2017-05-28 DIAGNOSIS — E7800 Pure hypercholesterolemia, unspecified: Secondary | ICD-10-CM | POA: Diagnosis not present

## 2017-05-28 DIAGNOSIS — G4730 Sleep apnea, unspecified: Secondary | ICD-10-CM | POA: Diagnosis not present

## 2017-05-28 DIAGNOSIS — I10 Essential (primary) hypertension: Secondary | ICD-10-CM | POA: Diagnosis not present

## 2017-05-28 DIAGNOSIS — I2510 Atherosclerotic heart disease of native coronary artery without angina pectoris: Secondary | ICD-10-CM | POA: Diagnosis not present

## 2017-06-18 DIAGNOSIS — H903 Sensorineural hearing loss, bilateral: Secondary | ICD-10-CM | POA: Diagnosis not present

## 2017-10-07 DIAGNOSIS — G4730 Sleep apnea, unspecified: Secondary | ICD-10-CM | POA: Diagnosis not present

## 2017-10-07 DIAGNOSIS — E785 Hyperlipidemia, unspecified: Secondary | ICD-10-CM | POA: Diagnosis not present

## 2017-10-07 DIAGNOSIS — Z125 Encounter for screening for malignant neoplasm of prostate: Secondary | ICD-10-CM | POA: Diagnosis not present

## 2017-10-07 DIAGNOSIS — I10 Essential (primary) hypertension: Secondary | ICD-10-CM | POA: Diagnosis not present

## 2017-10-23 DIAGNOSIS — E875 Hyperkalemia: Secondary | ICD-10-CM | POA: Diagnosis not present

## 2017-10-30 DIAGNOSIS — E119 Type 2 diabetes mellitus without complications: Secondary | ICD-10-CM | POA: Diagnosis not present

## 2017-10-30 DIAGNOSIS — Z955 Presence of coronary angioplasty implant and graft: Secondary | ICD-10-CM

## 2017-10-30 DIAGNOSIS — Z951 Presence of aortocoronary bypass graft: Secondary | ICD-10-CM | POA: Diagnosis not present

## 2017-10-30 DIAGNOSIS — I2510 Atherosclerotic heart disease of native coronary artery without angina pectoris: Secondary | ICD-10-CM | POA: Diagnosis not present

## 2017-10-30 DIAGNOSIS — I10 Essential (primary) hypertension: Secondary | ICD-10-CM | POA: Diagnosis not present

## 2017-10-30 HISTORY — DX: Presence of coronary angioplasty implant and graft: Z95.5

## 2017-11-04 DIAGNOSIS — I6523 Occlusion and stenosis of bilateral carotid arteries: Secondary | ICD-10-CM | POA: Diagnosis not present

## 2017-11-15 ENCOUNTER — Other Ambulatory Visit: Payer: Self-pay

## 2017-11-15 ENCOUNTER — Encounter (HOSPITAL_COMMUNITY): Payer: Self-pay

## 2017-11-15 ENCOUNTER — Observation Stay (HOSPITAL_COMMUNITY)
Admission: EM | Admit: 2017-11-15 | Discharge: 2017-11-16 | Disposition: A | Discharge status: Home / Self Care | Payer: Medicare Other | Attending: Internal Medicine | Admitting: Internal Medicine

## 2017-11-15 ENCOUNTER — Emergency Department (HOSPITAL_COMMUNITY): Payer: Medicare Other

## 2017-11-15 DIAGNOSIS — K219 Gastro-esophageal reflux disease without esophagitis: Secondary | ICD-10-CM | POA: Diagnosis present

## 2017-11-15 DIAGNOSIS — E785 Hyperlipidemia, unspecified: Secondary | ICD-10-CM | POA: Diagnosis present

## 2017-11-15 DIAGNOSIS — Z79899 Other long term (current) drug therapy: Secondary | ICD-10-CM | POA: Diagnosis not present

## 2017-11-15 DIAGNOSIS — I6529 Occlusion and stenosis of unspecified carotid artery: Secondary | ICD-10-CM | POA: Insufficient documentation

## 2017-11-15 DIAGNOSIS — I639 Cerebral infarction, unspecified: Secondary | ICD-10-CM | POA: Insufficient documentation

## 2017-11-15 DIAGNOSIS — G4733 Obstructive sleep apnea (adult) (pediatric): Secondary | ICD-10-CM

## 2017-11-15 DIAGNOSIS — H543 Unqualified visual loss, both eyes: Secondary | ICD-10-CM | POA: Diagnosis not present

## 2017-11-15 DIAGNOSIS — H539 Unspecified visual disturbance: Secondary | ICD-10-CM | POA: Diagnosis not present

## 2017-11-15 DIAGNOSIS — R410 Disorientation, unspecified: Secondary | ICD-10-CM | POA: Diagnosis not present

## 2017-11-15 DIAGNOSIS — R531 Weakness: Principal | ICD-10-CM | POA: Insufficient documentation

## 2017-11-15 DIAGNOSIS — E119 Type 2 diabetes mellitus without complications: Secondary | ICD-10-CM | POA: Diagnosis not present

## 2017-11-15 DIAGNOSIS — R7303 Prediabetes: Secondary | ICD-10-CM

## 2017-11-15 DIAGNOSIS — Z951 Presence of aortocoronary bypass graft: Secondary | ICD-10-CM

## 2017-11-15 DIAGNOSIS — Z87891 Personal history of nicotine dependence: Secondary | ICD-10-CM | POA: Diagnosis not present

## 2017-11-15 DIAGNOSIS — R9431 Abnormal electrocardiogram [ECG] [EKG]: Secondary | ICD-10-CM | POA: Diagnosis not present

## 2017-11-15 DIAGNOSIS — I2510 Atherosclerotic heart disease of native coronary artery without angina pectoris: Secondary | ICD-10-CM | POA: Diagnosis present

## 2017-11-15 DIAGNOSIS — Z7982 Long term (current) use of aspirin: Secondary | ICD-10-CM | POA: Diagnosis not present

## 2017-11-15 DIAGNOSIS — I10 Essential (primary) hypertension: Secondary | ICD-10-CM | POA: Diagnosis not present

## 2017-11-15 DIAGNOSIS — J449 Chronic obstructive pulmonary disease, unspecified: Secondary | ICD-10-CM | POA: Diagnosis present

## 2017-11-15 DIAGNOSIS — R0602 Shortness of breath: Secondary | ICD-10-CM | POA: Diagnosis not present

## 2017-11-15 DIAGNOSIS — R41841 Cognitive communication deficit: Secondary | ICD-10-CM | POA: Insufficient documentation

## 2017-11-15 DIAGNOSIS — J45909 Unspecified asthma, uncomplicated: Secondary | ICD-10-CM | POA: Insufficient documentation

## 2017-11-15 DIAGNOSIS — G4730 Sleep apnea, unspecified: Secondary | ICD-10-CM | POA: Diagnosis present

## 2017-11-15 DIAGNOSIS — I6523 Occlusion and stenosis of bilateral carotid arteries: Secondary | ICD-10-CM | POA: Diagnosis not present

## 2017-11-15 HISTORY — DX: Cerebral infarction, unspecified: I63.9

## 2017-11-15 LAB — CBC
HEMATOCRIT: 48.4 % (ref 39.0–52.0)
HEMOGLOBIN: 15.9 g/dL (ref 13.0–17.0)
MCH: 29.1 pg (ref 26.0–34.0)
MCHC: 32.9 g/dL (ref 30.0–36.0)
MCV: 88.5 fL (ref 78.0–100.0)
Platelets: 213 10*3/uL (ref 150–400)
RBC: 5.47 MIL/uL (ref 4.22–5.81)
RDW: 12.3 % (ref 11.5–15.5)
WBC: 6.7 10*3/uL (ref 4.0–10.5)

## 2017-11-15 LAB — COMPREHENSIVE METABOLIC PANEL
ALBUMIN: 4.1 g/dL (ref 3.5–5.0)
ALK PHOS: 62 U/L (ref 38–126)
ALT: 17 U/L (ref 0–44)
ANION GAP: 9 (ref 5–15)
AST: 20 U/L (ref 15–41)
BUN: 10 mg/dL (ref 8–23)
CALCIUM: 8.9 mg/dL (ref 8.9–10.3)
CHLORIDE: 102 mmol/L (ref 98–111)
CO2: 28 mmol/L (ref 22–32)
Creatinine, Ser: 0.97 mg/dL (ref 0.61–1.24)
GFR calc non Af Amer: >60 mL/min (ref >60)
GLUCOSE: 97 mg/dL (ref 70–99)
Potassium: 4.1 mmol/L (ref 3.5–5.1)
SODIUM: 139 mmol/L (ref 135–145)
Total Bilirubin: 1.2 mg/dL (ref 0.3–1.2)
Total Protein: 6.8 g/dL (ref 6.5–8.1)

## 2017-11-15 LAB — DIFFERENTIAL
Abs Immature Granulocytes: 0 10*3/uL (ref 0.0–0.1)
BASOS ABS: 0.1 10*3/uL (ref 0.0–0.1)
BASOS PCT: 1 %
EOS PCT: 2 %
Eosinophils Absolute: 0.1 10*3/uL (ref 0.0–0.7)
IMMATURE GRANULOCYTES: 0 %
LYMPHS PCT: 28 %
Lymphs Abs: 1.9 10*3/uL (ref 0.7–4.0)
MONO ABS: 0.7 10*3/uL (ref 0.1–1.0)
Monocytes Relative: 10 %
Neutro Abs: 4 10*3/uL (ref 1.7–7.7)
Neutrophils Relative %: 59 %

## 2017-11-15 LAB — PROTIME-INR
INR: 0.96
Prothrombin Time: 12.6 seconds (ref 11.4–15.2)

## 2017-11-15 LAB — URINALYSIS, ROUTINE W REFLEX MICROSCOPIC
BILIRUBIN URINE: NEGATIVE
Glucose, UA: NEGATIVE mg/dL
Hgb urine dipstick: NEGATIVE
Ketones, ur: NEGATIVE mg/dL
Leukocytes, UA: NEGATIVE
NITRITE: NEGATIVE
PH: 8 (ref 5.0–8.0)
Protein, ur: NEGATIVE mg/dL
SPECIFIC GRAVITY, URINE: 1.002 — AB (ref 1.005–1.030)

## 2017-11-15 LAB — I-STAT TROPONIN, ED: Troponin i, poc: 0.01 ng/mL (ref 0.00–0.08)

## 2017-11-15 LAB — RAPID URINE DRUG SCREEN, HOSP PERFORMED
AMPHETAMINES: NOT DETECTED
Barbiturates: NOT DETECTED
Benzodiazepines: NOT DETECTED
COCAINE: NOT DETECTED
Opiates: NOT DETECTED
TETRAHYDROCANNABINOL: NOT DETECTED

## 2017-11-15 LAB — GLUCOSE, CAPILLARY
GLUCOSE-CAPILLARY: 85 mg/dL (ref 70–99)
Glucose-Capillary: 147 mg/dL — ABNORMAL HIGH (ref 70–99)

## 2017-11-15 LAB — I-STAT CHEM 8, ED
BUN: 12 mg/dL (ref 8–23)
CALCIUM ION: 1.05 mmol/L — AB (ref 1.15–1.40)
CHLORIDE: 100 mmol/L (ref 98–111)
Creatinine, Ser: 0.9 mg/dL (ref 0.61–1.24)
Glucose, Bld: 94 mg/dL (ref 70–99)
HCT: 48 % (ref 39.0–52.0)
Hemoglobin: 16.3 g/dL (ref 13.0–17.0)
Potassium: 4.1 mmol/L (ref 3.5–5.1)
SODIUM: 138 mmol/L (ref 135–145)
TCO2: 27 mmol/L (ref 22–32)

## 2017-11-15 LAB — APTT: APTT: 30 s (ref 24–36)

## 2017-11-15 LAB — ETHANOL

## 2017-11-15 MED ORDER — ACETAMINOPHEN 650 MG RE SUPP: 650.0000 mg | RECTAL | Status: DC | PRN

## 2017-11-15 MED ORDER — STROKE: EARLY STAGES OF RECOVERY BOOK
Freq: Once | Status: AC
  Administered 2017-11-15: 20:00:00
  Filled 2017-11-15: qty 1

## 2017-11-15 MED ORDER — SENNOSIDES-DOCUSATE SODIUM 8.6-50 MG PO TABS: 1.0000 | ORAL_TABLET | Freq: Every evening | ORAL | Status: DC | PRN

## 2017-11-15 MED ORDER — IOPAMIDOL (ISOVUE-370) INJECTION 76%
INTRAVENOUS | Status: AC
Start: 2017-11-15 — End: 2017-11-16
  Filled 2017-11-15: qty 50

## 2017-11-15 MED ORDER — ACETAMINOPHEN 325 MG PO TABS: 650.0000 mg | ORAL_TABLET | ORAL | Status: DC | PRN

## 2017-11-15 MED ORDER — IOPAMIDOL (ISOVUE-370) INJECTION 76%
INTRAVENOUS | Status: AC
  Filled 2017-11-15: qty 50

## 2017-11-15 MED ORDER — BENAZEPRIL HCL 10 MG PO TABS
10.0000 mg | ORAL_TABLET | Freq: Every day | ORAL | Status: DC
  Administered 2017-11-16: 10 mg via ORAL
  Filled 2017-11-15 (×2): qty 1

## 2017-11-15 MED ORDER — INSULIN ASPART 100 UNIT/ML ~~LOC~~ SOLN: 0.0000 [IU] | Freq: Three times a day (TID) | SUBCUTANEOUS | Status: DC

## 2017-11-15 MED ORDER — ACETAMINOPHEN 160 MG/5ML PO SOLN: 650.0000 mg | ORAL | Status: DC | PRN

## 2017-11-15 MED ORDER — IOPAMIDOL (ISOVUE-370) INJECTION 76%
INTRAVENOUS | Status: AC
Start: 2017-11-15 — End: 2017-11-16
  Filled 2017-11-15: qty 100

## 2017-11-15 MED ORDER — ASPIRIN EC 81 MG PO TBEC
81.0000 mg | DELAYED_RELEASE_TABLET | Freq: Every day | ORAL | Status: DC
  Administered 2017-11-16: 81 mg via ORAL
  Filled 2017-11-15: qty 1

## 2017-11-15 MED ORDER — ENOXAPARIN SODIUM 40 MG/0.4ML ~~LOC~~ SOLN
40.0000 mg | SUBCUTANEOUS | Status: DC
  Administered 2017-11-15 – 2017-11-16 (×2): 40 mg via SUBCUTANEOUS
  Filled 2017-11-15 (×2): qty 0.4

## 2017-11-15 MED ORDER — IOPAMIDOL (ISOVUE-370) INJECTION 76%
100.0000 mL | Freq: Once | INTRAVENOUS | Status: AC | PRN
  Administered 2017-11-15: 100 mL via INTRAVENOUS

## 2017-11-15 MED ORDER — PRAVASTATIN SODIUM 40 MG PO TABS
40.0000 mg | ORAL_TABLET | Freq: Every day | ORAL | Status: DC
  Filled 2017-11-15: qty 1

## 2017-11-15 MED ORDER — SODIUM CHLORIDE 0.9 % IV SOLN
INTRAVENOUS | Status: AC
  Administered 2017-11-15 – 2017-11-16 (×2): via INTRAVENOUS

## 2017-11-15 NOTE — ED Notes (Signed)
MD notified of L sided facial palsey

## 2017-11-15 NOTE — ED Notes (Signed)
Attempted report 

## 2017-11-15 NOTE — Progress Notes (Signed)
EEG completed; results pending.    

## 2017-11-15 NOTE — Consult Note (Addendum)
NEURO HOSPITALIST      Requesting Physician: Dr. Alvino Chapel    Chief Complaint: vision problems, left side weakness, slurred speech  History obtained from:  Patient     HPI:                                                                                                                                         Albert Hayes is an 72 y.o. 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   Patient states that [place] he [place] [employer] and one of his buddies noticed a left facial droop and speech slurring. He states that his left arm and left leg felt weak, but it has gotten better. He went to the doctors office because of this and his physician sent him to the ED. Patient states that he has had transient symptoms since middle of last week (confusion,SOB), but the vision changes and weakness was noted yesterday.  He has noticed some left side weakness as well as having intermittent kaleidescope vision on the right inner eye and left peripheral of left eye that starts at the periphery and gradually spreads to midline. Stays for about 20 minutes and then goes away. Patient noted that sometimes his watch heart monitor alerts him that his heart rate is low in the 40's. He recently started ( last 2 weeks) a new BP medication.  (amlodipine-benazepril).  Since the medication change he started noticing these symptoms and that at times he will drag his left toe. This makes him trips but denies any falls, SOB, CP, or HA. Stopped smoking in 2005.  ED course:  BG: 94, BP: 111/71 CTA/CTP: ordered MRI: ordered  No prior stroke history noted   Date last known well: Midweek last week Time last known well: Unable to determine tPA Given: No: contraindicated outside of window Modified Rankin: Rankin  Score=1  NIHSS:5 1a Level of Conscious:0 1b LOC Questions: 0 1c LOC Commands: 0 2 Best Gaze: 0 3 Visual: 0 4 Facial Palsy: 1 5a Motor Arm - left: 1 5b Motor Arm - Right: 0 6a Motor Leg - Left:1  6b Motor Leg - Right: 0 7 Limb Ataxia: 1 8 Sensory: 0 9 Best Language: 0 10 Dysarthria:1 11 Extinct. and Inattention:0 TOTAL: 5  Past Medical History:  Diagnosis Date  . Anxiety   . Asthma   . BPH (benign prostatic hyperplasia)   . Conduct disorder   . Constipation   .  COPD (chronic obstructive pulmonary disease) ([place])   . Coronary artery disease   . Depression   . Diabetes mellitus without complication ([place])   . ED (erectile dysfunction)   . GERD (gastroesophageal reflux disease)   . Heart murmur    EJECTION MURMUR  . History of colon polyps   . HLD (hyperlipidemia)   . Hypertension   . Low back pain   . PTSD (post-traumatic stress disorder)   . Sleep apnea   . Thrombocytopenia ([place])   . Vitamin D deficiency     Past Surgical History:  Procedure Laterality Date  . COLONOSCOPY    . COLONOSCOPY WITH PROPOFOL N/A 10/31/2016   Procedure: COLONOSCOPY WITH PROPOFOL;  Surgeon: Manya Silvas, MD;  Location[facility];  Service: Endoscopy;  Laterality: N/A;  . CORONARY ANGIOPLASTY    . CORONARY ARTERY BYPASS GRAFT    . ESOPHAGOGASTRODUODENOSCOPY  2016  . SEPTOPLASTY    . spine/bone surgery    . TONSILLECTOMY      Family History  Problem Relation Age of Onset  . Cancer Father        Lung cancer       Social History:  reports that he quit smoking about 29 years ago. He has never used smokeless tobacco. He reports that he does not drink alcohol or use drugs.  Allergies:  Allergies  Allergen Reactions  . Xanax [Alprazolam] Shortness Of Breath  . Bupropion   . Cymbalta [Duloxetine Hcl] Other (See Comments)  . Fluoxetine   . Fluvoxamine   . Lexapro [Escitalopram Oxalate] Other (See Comments)  . Niacin And Related Other (See Comments)  . Oxcarbazepine Other  (See Comments)  . Penicillins     severely allergic  . Pravastatin Other (See Comments)  . Seroquel [Quetiapine Fumarate] Other (See Comments)  . Tamsulosin   . Zoloft [Sertraline Hcl] Other (See Comments)    Medications:                                                                                                                           No current facility-administered medications for this encounter.    Current Outpatient Medications  Medication Sig Dispense Refill  . aspirin 81 MG tablet Take 81 mg by mouth daily.    . bisacodyl (DULCOLAX) 5 MG EC tablet Take 5 mg by mouth daily as needed for moderate constipation.    . clonazePAM (KLONOPIN) 1 MG tablet Take 1 mg by mouth at bedtime.    . gabapentin (NEURONTIN) 100 MG capsule Take 100 mg by mouth 3 (three) times daily.    Marland Kitchen linaclotide (LINZESS) 145 MCG CAPS capsule Take 145 mcg by mouth daily before breakfast.    . Pitavastatin Calcium (LIVALO) 2 MG TABS Take by mouth. Reported on 05/25/2015    . ramipril (ALTACE) 2.5 MG capsule Take 2.5 mg by mouth daily.    . sildenafil (REVATIO) 20 MG tablet 1-2 tablets prior to onset of sexual activity. [place]  tablet 0  . venlafaxine (EFFEXOR) 37.5 MG tablet Take 37.5 mg by mouth daily.      ROS:                                                                                                                                       History obtained from the patient  General ROS: negative for - chills, fatigue, fever, night sweats, weight gain or weight loss Ophthalmic ROS: positive for - vision changes loss  Respiratory ROS: negative for - cough,  shortness of breath or wheezing Cardiovascular ROS: negative for - chest pain, dyspnea on exertion,  Gastrointestinal ROS: negative for - abdominal pain, diarrhea,  nausea/vomiting or stool incontinence Genito-Urinary ROS: positive for - urinary urgency negative for: dysuria, hematuria, incontinence  Musculoskeletal ROS: positive for -  muscular  weakness Neurological ROS: as noted in HPI   General Examination:                                                                                                      Blood pressure 139/85, pulse 75, temperature 98.3 F (36.8 C), temperature source Oral, resp. rate 16, height 5\' 7"  (1.702 m), weight 78 kg, SpO2 100 %.  HEENT-  Normocephalic, no lesions, without obvious abnormality.  Normal external eye and conjunctiva. Cardiovascular- pulses palpable throughout  Lungs- no excessive working breathing.  Saturations within normal limits on RA Extremities- Warm, dry and intact Musculoskeletal-no joint tenderness, deformity or swelling Skin-warm and dry,intact  Neurological Examination Mental Status: Alert, oriented,to age/name/city/state/hospital/month/year.  Speech fluent without evidence of aphasia.  Some mild dysarthria noted. Able to follow commands without difficulty. Cranial Nerves: II:  Visual fields grossly normal,  III,IV, VI: ptosis not present, extra-ocular motions intact bilaterally, pupils anisocoric 2mm difference. Left > right , round and reactive to light and accomodation.  V,VII: smile asymmetric, light left side facial droop noted. facial light touch sensation normal bilaterally VIII: hearing normal bilaterally IX,X: uvula rises symmetrically XI: bilateral shoulder shrug XII: midline tongue extension Motor: Right : Upper extremity   5/5    Left:     Upper extremity   4/5  Lower extremity   5/5     Lower extremity   4/5 Tone and bulk:normal tone throughout; no atrophy noted Sensory:light touch intact throughout, bilaterally Deep Tendon Reflexes: 2+ and symmetric biceps and patella Plantars: Right: downgoing   Left: downgoing Cerebellar: normal finger-to-nose, HTS mildly ataxic on right, unable to perform HTS on left Gait: deferred  Lab Results: Basic Metabolic Panel: Recent Labs  Lab 11/15/17 1155  NA 138  K 4.1  CL 100  GLUCOSE 94  BUN 12  CREATININE  0.90    CBC: Recent Labs  Lab 11/15/17 1155  HGB 16.3  HCT 48.0    Lipid Panel: No results for input(s): CHOL, TRIG, HDL, CHOLHDL, VLDL, LDLCALC in the last 168 hours.  CBG: No results for input(s): GLUCAP in the last 168 hours.  Imaging: No results found.     Laurey Morale, MSN, NP-C [facility] [phone]  11/15/2017, 12:23 PM   Attending physician note to follow with Assessment and plan .  I have seen the patient and reviewed the note.   On my exam he has some inconsistent findings, holding his cheek taut on the left side while lacking when smiling on that side.  He also has a downward drift without pronation in his left arm.  Assessment: 72 y.o. male with positive visual symptoms of a slowly progressing brightly colored "pixelated" scotoma in his left visual field.  He has a strong family history of migraines, and he has a history of headaches causing him to seek dark rooms, though not any recently.  He does have a mild pressure which might be a mild headache currently.  All the symptoms started following his finding out about his carotid stenosis,  and I wonder if this could be playing a role, however he is certainly at risk for stroke and this needs to be assessed for.   Recommend: --MRI Brain ( first; proceed with stroke work-up if MRI +) --EEG -routine --If MRI is negative, will consider treatment for migraine  Roland Rack, MD [facility] [phone]  If 7pm- 7am, please page neurology on call as listed in [place].

## 2017-11-15 NOTE — Progress Notes (Signed)
ED report received at 1810 and pt arrived to the unit via stretcher at 1848. Pt A&O x4, MAE x4. Pt oriented to the unit and room; fall/safety precaution and prevention education completed with pt. VSS; telemetry applied and verified with CCMD; NT called to second verify. Pt skin intact with no opened wound or pressure ulcer noted; Pt resting in bed with call light within reach and spouse remains at bedside. Reported off to oncoming RN. Delia Heady RN

## 2017-11-15 NOTE — ED Triage Notes (Addendum)
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 Rosana Hoes); Hx triple bypass 2005; pt also endorsers SOB while walking around [employer] today  LVO score 3 w/ EMS  160/98 100% RA 157/87 P 75 CBG 93

## 2017-11-15 NOTE — ED Provider Notes (Signed)
[facility] EMERGENCY DEPARTMENT Provider Note   CSN: [id number] Arrival date & time: 11/15/17  1114     History   Chief Complaint Chief Complaint  Patient presents with  . Stroke Symptoms    HPI Albert Hayes is a 72 y.o. male.  HPI Patient presents with neurologic symptoms.  States earlier today and with an episode yesterday he had vision change come from the left aspect of both his eyes were crossed to the middle and then later go back to normal.  No headache.  States he has had some facial droop.  States he has difficulty walking difficulty moving his left side.  Recently found out he had carotid narrowing and due to see vascular surgery for it.  Reportedly has had some confusion.  States he has some shortness of breath.  States he has had some urinary incontinence, which is unusual for him.  Last normal would have been last night when he went to bed. Past Medical History:  Diagnosis Date  . Anxiety   . Asthma   . BPH (benign prostatic hyperplasia)   . Conduct disorder   . Constipation   . COPD (chronic obstructive pulmonary disease) ([place])   . Coronary artery disease   . Depression   . Diabetes mellitus without complication ([place])   . ED (erectile dysfunction)   . GERD (gastroesophageal reflux disease)   . Heart murmur    EJECTION MURMUR  . History of colon polyps   . HLD (hyperlipidemia)   . Hypertension   . Low back pain   . PTSD (post-traumatic stress disorder)   . Sleep apnea   . Thrombocytopenia ([place])   . Vitamin D deficiency     Patient Active Problem List   Diagnosis Date Noted  . Arteriosclerosis of coronary artery 12/22/2014  . BP (high blood pressure) 12/22/2014  . Chronic obstructive pulmonary disease ([place]) 12/22/2014  . H/O coronary artery bypass surgery 12/22/2014  . HLD (hyperlipidemia) 12/22/2014    Past Surgical History:  Procedure Laterality Date  . COLONOSCOPY    . COLONOSCOPY WITH PROPOFOL N/A 10/31/2016   Procedure:  COLONOSCOPY WITH PROPOFOL;  Surgeon: Manya Silvas, MD;  Location: [facility] ENDOSCOPY;  Service: Endoscopy;  Laterality: N/A;  . CORONARY ANGIOPLASTY    . CORONARY ARTERY BYPASS GRAFT    . ESOPHAGOGASTRODUODENOSCOPY  2016  . SEPTOPLASTY    . spine/bone surgery    . TONSILLECTOMY          Home Medications    Prior to Admission medications   Medication Sig Start Date End Date Taking? Authorizing Provider  aspirin 81 MG tablet Take 81 mg by mouth daily.    [provider]  bisacodyl (DULCOLAX) 5 MG EC tablet Take 5 mg by mouth daily as needed for moderate constipation.    [provider]  clonazePAM (KLONOPIN) 1 MG tablet Take 1 mg by mouth at bedtime.    [provider]  gabapentin (NEURONTIN) 100 MG capsule Take 100 mg by mouth 3 (three) times daily.    [provider]  linaclotide (LINZESS) 145 MCG CAPS capsule Take 145 mcg by mouth daily before breakfast.    [provider]  Pitavastatin Calcium (LIVALO) 2 MG TABS Take by mouth. Reported on 05/25/2015    [provider]  ramipril (ALTACE) 2.5 MG capsule Take 2.5 mg by mouth daily.    [provider]  sildenafil (REVATIO) 20 MG tablet 1-2 tablets prior to onset of sexual activity.  02/14/15   Wendie Agreste, MD  venlafaxine (EFFEXOR) 37.5 MG tablet Take 37.5 mg by mouth daily.    [provider]    Family History Family History  Problem Relation Age of Onset  . Cancer Father        Lung cancer    Social History Social History   Tobacco Use  . Smoking status: Former Smoker    Last attempt to quit: 10/30/1988    Years since quitting: 29.0  . Smokeless tobacco: Never Used  Substance Use Topics  . Alcohol use: No    Alcohol/week: 0.0 standard drinks  . Drug use: No     Allergies   Xanax [alprazolam]; Bupropion; Cymbalta [duloxetine hcl]; Fluoxetine; Fluvoxamine; Lexapro [escitalopram oxalate]; Niacin and related; Oxcarbazepine; Penicillins;  Pravastatin; Seroquel [quetiapine fumarate]; Tamsulosin; and Zoloft [sertraline hcl]   Review of Systems Review of Systems  Constitutional: Negative for appetite change.  HENT: Negative for congestion.   Eyes: Positive for visual disturbance.  Respiratory: Positive for shortness of breath.   Cardiovascular: Negative for chest pain.  Genitourinary: Negative for flank pain.  Musculoskeletal: Negative for back pain.  Neurological: Positive for weakness. Negative for headaches.  Psychiatric/Behavioral: Negative for confusion.     Physical Exam Updated Vital Signs BP 122/66   Pulse 76   Temp 98.3 F (36.8 C) (Oral)   Resp 16   Ht 5\' 7"  (1.702 m)   Wt 78 kg   SpO2 96%   BMI 26.94 kg/m   Physical Exam  Constitutional: He is oriented to person, place, and time. He appears well-developed.  HENT:  Head: Atraumatic.  Eyes: EOM are normal.  Visual fields grossly intact to confrontation  Neck: Neck supple.  Cardiovascular: Normal rate.  Pulmonary/Chest: No stridor.  Abdominal: There is no tenderness.  Neurological: He is alert and oriented to person, place, and time. No cranial nerve deficit.  Face symmetric.  Eye movements intact.  Visual fields grossly intact by confrontation.  Decrease strength on straight leg raise on left and has some drift on left upper extremity without pronation.  Skin: Skin is warm. Capillary refill takes less than 2 seconds.     ED Treatments / Results  Labs (all labs ordered are listed, but only abnormal results are displayed) Labs Reviewed  URINALYSIS, ROUTINE W REFLEX MICROSCOPIC - Abnormal; Notable for the following components:      Result Value   Color, Urine COLORLESS (*)    Specific Gravity, Urine 1.002 (*)    All other components within normal limits  I-STAT CHEM 8, ED - Abnormal; Notable for the following components:   Calcium, Ion 1.05 (*)    All other components within normal limits  PROTIME-INR  APTT  CBC  DIFFERENTIAL    COMPREHENSIVE METABOLIC PANEL  ETHANOL  RAPID URINE DRUG SCREEN, HOSP PERFORMED  I-STAT TROPONIN, ED  CBG MONITORING, ED    EKG EKG Interpretation  Date/Time:  Friday November 15 2017 11:23:05 EDT Ventricular Rate:  78 PR Interval:    QRS Duration: 94 QT Interval:  398 QTC Calculation: 454 R Axis:   144 Text Interpretation:  Sinus rhythm Left posterior fascicular block Confirmed by Davonna Belling [phone]) on 11/15/2017 11:28:24 AM   Radiology Ct Angio Head W Or Wo Contrast  Result Date: 11/15/2017 CLINICAL DATA:  Arterial stricture/occlusion, head and neck. EXAM: CT ANGIOGRAPHY HEAD AND NECK CT PERFUSION BRAIN TECHNIQUE: Multidetector CT imaging of the head and neck was performed using the standard protocol during bolus administration of  intravenous contrast. Multiplanar CT image reconstructions and MIPs were obtained to evaluate the vascular anatomy. Carotid stenosis measurements (when applicable) are obtained utilizing NASCET criteria, using the distal internal carotid diameter as the denominator. Multiphase CT imaging of the brain was performed following IV bolus contrast injection. Subsequent parametric perfusion maps were calculated using RAPID software. CONTRAST:  161mL ISOVUE-370 IOPAMIDOL (ISOVUE-370) INJECTION 76% COMPARISON:  None available FINDINGS: CT HEAD FINDINGS Brain: No evidence of acute infarction, hemorrhage, hydrocephalus, extra-axial collection or mass lesion/mass effect. Mild volume loss with nonspecific pattern Vascular: See below Skull: Negative Sinuses/Orbits: Negative ASPECTS ([place] Stroke Program Early CT Score) - Ganglionic level infarction (caudate, lentiform nuclei, internal capsule, insula, M1-M3 cortex): 7 - Supraganglionic infarction (M4-M6 cortex): 3 Total score (0-10 with 10 being normal): 10 Review of the MIP images confirms the above findings CTA NECK FINDINGS Aortic arch: Covered portions are negative for dilatation. No dissection seen. There is 2 vessel  branching. Partially covered changes of CABG Right carotid system: Intermittent calcified plaque on the anterior wall of the common carotid. Bulky plaque at the common carotid bifurcation and proximal ICA with stenosis of at least 80% based on coronal reformat measurement. Downstream vessel is patent without beading or dissection. Left carotid system: Moderate calcified plaque on the anterior wall of the common carotid. Bulky plaque at the common carotid bifurcation and proximal ICA. Proximal ICA stenosis measures 40%. No ulceration or beading. Vertebral arteries: No proximal subclavian flow limiting stenosis. There is atherosclerotic plaque on the left subclavian. Calcified plaque along the bilateral vertebral origin with mild narrowing on the left. Both vertebral arteries are smooth and widely patent to the dura. Skeleton: Bulky spondylosis out of proportion to disc space narrowing and consistent with diffuse idiopathic skeletal hyperostosis. There is encroachment on the hypopharynx and upper esophagus. Multilevel vertebral ankylosis. Other neck: Negative Upper chest: CABG changes noted above. Apical lungs are clear. Review of the MIP images confirms the above findings CTA HEAD FINDINGS Anterior circulation: Atherosclerotic plaque on the bilateral carotid siphons. 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 No focal flow limiting stenosis. Negative for aneurysm. Posterior circulation: The left vertebral artery is dominant. Vertebrobasilar arteries are smooth and widely patent. Robust flow in bilateral vertebrobasilar branches. Venous sinuses: Patent Anatomic variants: None significant Delayed phase: No abnormal intracranial enhancement Review of the MIP images confirms the above findings CT Brain Perfusion Findings: CBF (<30%) Volume: 63mL Perfusion (Tmax>6.0s) volume: 31mL IMPRESSION: 1. No emergent large vessel occlusion. No infarct or penumbra by CT perfusion. 2. 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 3. 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 4. No intracranial flow limiting stenosis. Electronically Signed   By: Monte Fantasia M.D.   On: 11/15/2017 14:27   Ct Angio Neck W Or Wo Contrast  Result Date: 11/15/2017 CLINICAL DATA:  Arterial stricture/occlusion, head and neck. EXAM: CT ANGIOGRAPHY HEAD AND NECK CT PERFUSION BRAIN TECHNIQUE: Multidetector CT imaging of the head and neck was performed using the standard protocol during bolus administration of intravenous contrast. Multiplanar CT image reconstructions and MIPs were obtained to evaluate the vascular anatomy. Carotid stenosis measurements (when applicable) are obtained utilizing NASCET criteria, using the distal internal carotid diameter as the denominator. Multiphase CT imaging of the brain was performed following IV bolus contrast injection. Subsequent parametric perfusion maps were calculated using RAPID software. CONTRAST:  132mL ISOVUE-370 IOPAMIDOL (ISOVUE-370) INJECTION 76% COMPARISON:  None available FINDINGS: CT HEAD FINDINGS Brain:  No evidence of acute infarction, hemorrhage, hydrocephalus, extra-axial collection or mass lesion/mass effect. Mild volume loss with nonspecific pattern Vascular: See below Skull: Negative Sinuses/Orbits: Negative ASPECTS ([place] Stroke Program Early CT Score) - Ganglionic level infarction (caudate, lentiform nuclei, internal capsule, insula, M1-M3 cortex): 7 - Supraganglionic infarction (M4-M6 cortex): 3 Total score (0-10 with 10 being normal): 10 Review of the MIP images confirms the above findings CTA NECK FINDINGS Aortic arch: Covered portions are negative for dilatation. No dissection seen. There is 2 vessel branching. Partially covered changes of CABG Right carotid system: Intermittent calcified plaque on the anterior wall of the common carotid. Bulky plaque at the common carotid bifurcation and proximal ICA with stenosis of at least 80% based  on coronal reformat measurement. Downstream vessel is patent without beading or dissection. Left carotid system: Moderate calcified plaque on the anterior wall of the common carotid. Bulky plaque at the common carotid bifurcation and proximal ICA. Proximal ICA stenosis measures 40%. No ulceration or beading. Vertebral arteries: No proximal subclavian flow limiting stenosis. There is atherosclerotic plaque on the left subclavian. Calcified plaque along the bilateral vertebral origin with mild narrowing on the left. Both vertebral arteries are smooth and widely patent to the dura. Skeleton: Bulky spondylosis out of proportion to disc space narrowing and consistent with diffuse idiopathic skeletal hyperostosis. There is encroachment on the hypopharynx and upper esophagus. Multilevel vertebral ankylosis. Other neck: Negative Upper chest: CABG changes noted above. Apical lungs are clear. Review of the MIP images confirms the above findings CTA HEAD FINDINGS Anterior circulation: Atherosclerotic plaque on the bilateral carotid siphons. 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 No focal flow limiting stenosis. Negative for aneurysm. Posterior circulation: The left vertebral artery is dominant. Vertebrobasilar arteries are smooth and widely patent. Robust flow in bilateral vertebrobasilar branches. Venous sinuses: Patent Anatomic variants: None significant Delayed phase: No abnormal intracranial enhancement Review of the MIP images confirms the above findings CT Brain Perfusion Findings: CBF (<30%) Volume: 51mL Perfusion (Tmax>6.0s) volume: 43mL IMPRESSION: 1. No emergent large vessel occlusion. No infarct or penumbra by CT perfusion. 2. Advanced atheromatous narrowing of the proximal right ICA, at least 80%. 3. 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 4. No intracranial flow limiting stenosis. Electronically Signed   By: Monte Fantasia M.D.   On: 11/15/2017 14:27   Ct Cerebral Perfusion  W Contrast  Result Date: 11/15/2017 CLINICAL DATA:  Arterial stricture/occlusion, head and neck. EXAM: CT ANGIOGRAPHY HEAD AND NECK CT PERFUSION BRAIN TECHNIQUE: Multidetector CT imaging of the head and neck was performed using the standard protocol during bolus administration of intravenous contrast. Multiplanar CT image reconstructions and MIPs were obtained to evaluate the vascular anatomy. Carotid stenosis measurements (when applicable) are obtained utilizing NASCET criteria, using the distal internal carotid diameter as the denominator. Multiphase CT imaging of the brain was performed following IV bolus contrast injection. Subsequent parametric perfusion maps were calculated using RAPID software. CONTRAST:  194mL ISOVUE-370 IOPAMIDOL (ISOVUE-370) INJECTION 76% COMPARISON:  None available FINDINGS: CT HEAD FINDINGS Brain: No evidence of acute infarction, hemorrhage, hydrocephalus, extra-axial collection or mass lesion/mass effect. Mild volume loss with nonspecific pattern Vascular: See below Skull: Negative Sinuses/Orbits: Negative ASPECTS ([place] Stroke Program Early CT Score) - Ganglionic level infarction (caudate, lentiform nuclei, internal capsule, insula, M1-M3 cortex): 7 - Supraganglionic infarction (M4-M6 cortex): 3 Total score (0-10 with 10 being normal): 10 Review of the MIP images confirms the above findings CTA NECK FINDINGS Aortic arch: Covered  portions are negative for dilatation. No dissection seen. There is 2 vessel branching. Partially covered changes of CABG Right carotid system: Intermittent calcified plaque on the anterior wall of the common carotid. Bulky plaque at the common carotid bifurcation and proximal ICA with stenosis of at least 80% based on coronal reformat measurement. Downstream vessel is patent without beading or dissection. Left carotid system: Moderate calcified plaque on the anterior wall of the common carotid. Bulky plaque at the common carotid bifurcation and proximal ICA.  Proximal ICA stenosis measures 40%. No ulceration or beading. Vertebral arteries: No proximal subclavian flow limiting stenosis. There is atherosclerotic plaque on the left subclavian. Calcified plaque along the bilateral vertebral origin with mild narrowing on the left. Both vertebral arteries are smooth and widely patent to the dura. Skeleton: Bulky spondylosis out of proportion to disc space narrowing and consistent with diffuse idiopathic skeletal hyperostosis. There is encroachment on the hypopharynx and upper esophagus. Multilevel vertebral ankylosis. Other neck: Negative Upper chest: CABG changes noted above. Apical lungs are clear. Review of the MIP images confirms the above findings CTA HEAD FINDINGS Anterior circulation: Atherosclerotic plaque on the bilateral carotid siphons. 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 No focal flow limiting stenosis. Negative for aneurysm. Posterior circulation: The left vertebral artery is dominant. Vertebrobasilar arteries are smooth and widely patent. Robust flow in bilateral vertebrobasilar branches. Venous sinuses: Patent Anatomic variants: None significant Delayed phase: No abnormal intracranial enhancement Review of the MIP images confirms the above findings CT Brain Perfusion Findings: CBF (<30%) Volume: 69mL Perfusion (Tmax>6.0s) volume: 55mL IMPRESSION: 1. No emergent large vessel occlusion. No infarct or penumbra by CT perfusion. 2. Advanced atheromatous narrowing of the proximal right ICA, at least 80%. 3. 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 4. No intracranial flow limiting stenosis. Electronically Signed   By: Monte Fantasia M.D.   On: 11/15/2017 14:27    Procedures Procedures (including critical care time)  Medications Ordered in ED Medications  iopamidol (ISOVUE-370) 76 % injection (has no administration in time range)  iopamidol (ISOVUE-370) 76 % injection (has no administration in time range)  iopamidol  (ISOVUE-370) 76 % injection (has no administration in time range)  iopamidol (ISOVUE-370) 76 % injection 100 mL (100 mLs Intravenous Contrast Given 11/15/17 1324)     Initial Impression / Assessment and Plan / ED Course  I have reviewed the triage vital signs and the nursing notes.  Pertinent labs & imaging results that were available during my care of the patient were reviewed by me and considered in my medical decision making (see chart for details).     Patient with vision change and possible left-sided weakness.  Head CT CTA and CT perfusion overall reassuring.  Does have some carotid narrowing.  Seen by neurology.  Neurology recommends MRI and EEG.  Only stroke work-up if MRI is positive.  Thinks with the progressive vision changes going from left to right gradually could be migraine.  Admit to hospitalist.  Final Clinical Impressions(s) / ED Diagnoses   Final diagnoses:  Weakness  Vision changes    ED Discharge Orders    None       Davonna Belling, MD 11/15/17 1520

## 2017-11-15 NOTE — ED Notes (Signed)
Neurology at bedside.

## 2017-11-15 NOTE — H&P (Signed)
History and Physical    Albert Hayes TDD:[id number] DOB: Jan 13, 1946 DOA: 11/15/2017  PCP: Wendie Agreste, MD   Patient coming from: [employer]  I have personally briefly reviewed patient's old medical records in [place]  Chief Complaint: Visual disturbances and facial droop  HPI: Albert Hayes is a 72 y.o. male with medical history significant of high blood pressure, diabetes mellitus, coronary artery disease, chronic obstructive pulmonary disease, hyperlipidemia, sleep apnea, and gastroesophageal reflux disease who presents after being at [place] [facility] and found to be used with left arm drift and left visual field disturbances.  The patient has an appointment Tuesday with Dr. 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  He has a history of coronary artery bypass in 2005 and says he was also short of breath while walking around at the [employer] today.  He notes that he was having difficulty walking and moving his left side.  He recent found out that he has carotid narrowing is due to see vascular surgery for it.  He has had some confusion where he thought today was Wednesday.  He also had some urinary incontinence which is unusual for him.  His last known normal was last night when he went to bed.  Time I saw the patient most of his symptoms had resolved  ED Course: Head CT CTA and CT perfusion were overall reassuring PEEP.  He does have some carotid narrowing.  He was seen by neurology who recommended MRI and EEG.  Stroke work-up if MRI is positive.  Strong suspicion that this might be a migraine due to progressive vision changes going from left to right.  Patient will be admitted to triad hospitalist service.  Review of Systems: As per HPI otherwise all other systems reviewed and  negative.    Past Medical History:  Diagnosis Date  . Anxiety   . Asthma   . BPH (benign prostatic hyperplasia)   . Conduct disorder   . Constipation   . COPD  (chronic obstructive pulmonary disease) ([place])   . Coronary artery disease   . Depression   . Diabetes mellitus without complication ([place])   . ED (erectile dysfunction)   . GERD (gastroesophageal reflux disease)   . Heart murmur    EJECTION MURMUR  . History of colon polyps   . HLD (hyperlipidemia)   . Hypertension   . Low back pain   . PTSD (post-traumatic stress disorder)   . Sleep apnea   . Thrombocytopenia ([place])   . Vitamin D deficiency     Past Surgical History:  Procedure Laterality Date  . COLONOSCOPY    . COLONOSCOPY WITH PROPOFOL N/A 10/31/2016   Procedure: COLONOSCOPY WITH PROPOFOL;  Surgeon: Manya Silvas, MD;  Location: [facility] END[facility]COPY;  Service: Endoscopy;  Laterality: N/A;  . CORONARY ANGIOPLASTY    . CORONARY ARTERY BYPASS GRAFT    . ESOPHAGOGASTRODUODENOSCOPY  2016  . SEPTOPLASTY    . spine/bone surgery    . TONSILLECTOMY      Social History   Social History Narrative   Married. Education: high school/other. Exercise: Yes.     reports that he quit smoking about 29 years ago. He has never used smokeless tobacco. He reports that he does not drink alcohol or use drugs.  Allergies  Allergen Reactions  . Penicillins Other (See Comments)    Unknown childhood allergic reaction - severe Has patient had a PCN reaction causing immediate rash, facial/tongue/throat  swelling, SOB or lightheadedness with hypotension: Unknown Has patient had a PCN reaction causing severe rash involving mucus membranes or skin necrosis: Unknown Has patient had a PCN reaction that required hospitalization: Yes Has patient had a PCN reaction occurring within the last 10 years: No If all of the above answers are "NO", then may proceed with Cephalosporin use.  . Xanax [Alprazolam] Shortness Of Breath  . Bupropion Other (See Comments)    Unknown reaction  . Citalopram Other (See Comments)    Unknown reaction  . Cymbalta [Duloxetine Hcl] Other (See Comments)    Unknown reaction  .  Fluoxetine Other (See Comments)    Unknown reaction  . Fluvoxamine Other (See Comments)    Unknown reaction  . Lexapro [Escitalopram Oxalate] Other (See Comments)    Unknown reaction  . Niacin And Related Other (See Comments)    Unknown reaction  . Oxcarbazepine Other (See Comments)    Unknown reaction  . Pravastatin Other (See Comments)    Unknown reaction  . Seroquel [Quetiapine Fumarate] Other (See Comments)    Unknown reaction  . Statins Other (See Comments)    Unknown adverse reaction  . Tamsulosin Other (See Comments)    Unknown reaction  . Zoloft [Sertraline Hcl] Other (See Comments)    Unknown reaction    Family History  Problem Relation Age of Onset  . Headache Mother   . Cancer Father        Lung cancer     Prior to Admission medications   Medication Sig Start Date End Date Taking? Authorizing Provider  aspirin 81 MG tablet Take 81 mg by mouth daily.    [provider]  bisacodyl (DULCOLAX) 5 MG EC tablet Take 5 mg by mouth daily as needed for moderate constipation.    [provider]  clonazePAM (KLONOPIN) 1 MG tablet Take 1 mg by mouth at bedtime.    [provider]  gabapentin (NEURONTIN) 100 MG capsule Take 100 mg by mouth 3 (three) times daily.    [provider]  linaclotide (LINZESS) 145 MCG CAPS capsule Take 145 mcg by mouth daily before breakfast.    [provider]  Pitavastatin Calcium (LIVALO) 2 MG TABS Take by mouth. Reported on 05/25/2015    [provider]  ramipril (ALTACE) 2.5 MG capsule Take 2.5 mg by mouth daily.    [provider]  sildenafil (REVATIO) 20 MG tablet 1-2 tablets prior to onset of sexual activity. 02/14/15   Wendie Agreste, MD  venlafaxine (EFFEXOR) 37.5 MG tablet Take 37.5 mg by mouth daily.    [provider]    Physical Exam:  Constitutional: NAD, calm, comfortable Vitals:   11/15/17 1615 11/15/17 1629 11/15/17 1630 11/15/17 1645  BP: 120/74  110/79  139/73  Pulse: 75  73 76  Resp: 11  16 13   Temp:      TempSrc:      SpO2: 95% 98% 94% 98%  Weight:      Height:       Eyes: PERRL, lids and conjunctivae normal ENMT: Mucous membranes are moist. Posterior pharynx clear of any exudate or lesions.Normal dentition.  Neck: normal, supple, no masses, no thyromegaly Respiratory: clear to auscultation bilaterally, no wheezing, no crackles. Normal respiratory effort. No accessory muscle use.  Cardiovascular: Regular rate and rhythm, no murmurs / rubs / gallops. No extremity edema. 2+ pedal pulses. No carotid bruits.  Abdomen: no tenderness, no masses palpated. No hepatosplenomegaly. Bowel sounds positive.  Musculoskeletal: no  clubbing / cyanosis. No joint deformity upper and lower extremities. Good ROM, no contractures. Normal muscle tone.  Skin: no rashes, lesions, ulcers. No induration Neurologic: CN 2-12 grossly intact for very mild left facial droop. Sensation intact, DTR normal. Strength 5/5 in all 4.  Psychiatric: Normal judgment and insight. Alert and oriented x 3. Normal mood.    Labs on Admission: I have personally reviewed following labs and imaging studies  CBC: Recent Labs  Lab 11/15/17 1135 11/15/17 1155  WBC 6.7  --   NEUTROABS 4.0  --   HGB 15.9 16.3  HCT 48.4 48.0  MCV 88.5  --   PLT 213  --    Basic Metabolic Panel: Recent Labs  Lab 11/15/17 1135 11/15/17 1155  NA 139 138  K 4.1 4.1  CL 102 100  CO2 28  --   GLUCOSE 97 94  BUN 10 12  CREATININE 0.97 0.90  CALCIUM 8.9  --    GFR: Estimated Creatinine Clearance: 69.4 mL/min (by C-G formula based on SCr of 0.9 mg/dL). Liver Function Tests: Recent Labs  Lab 11/15/17 1135  AST 20  ALT 17  ALKPHOS 62  BILITOT 1.2  PROT 6.8  ALBUMIN 4.1   Coagulation Profile: Recent Labs  Lab 11/15/17 1135  INR 0.96   Urine analysis:    Component Value Date/Time   COLORURINE COLORLESS (A) 11/15/2017 [place] 11/15/2017 1203   LABSPEC 1.002 (L)  11/15/2017 1203   PHURINE 8.0 11/15/2017 1203   GLUCOSEU NEGATIVE 11/15/2017 1203   HGBUR NEGATIVE 11/15/2017 [place] 11/15/2017 1203   KETONESUR NEGATIVE 11/15/2017 1203   PROTEINUR NEGATIVE 11/15/2017 1203   NITRITE NEGATIVE 11/15/2017 1203   LEUKOCYTESUR NEGATIVE 11/15/2017 1203    Radiological Exams on Admission: Ct Angio Head W Or Wo Contrast  Result Date: 11/15/2017 CLINICAL DATA:  Arterial stricture/occlusion, head and neck. EXAM: CT ANGIOGRAPHY HEAD AND NECK CT PERFUSION BRAIN TECHNIQUE: Multidetector CT imaging of the head and neck was performed using the standard protocol during bolus administration of intravenous contrast. Multiplanar CT image reconstructions and MIPs were obtained to evaluate the vascular anatomy. Carotid stenosis measurements (when applicable) are obtained utilizing NASCET criteria, using the distal internal carotid diameter as the denominator. Multiphase CT imaging of the brain was performed following IV bolus contrast injection. Subsequent parametric perfusion maps were calculated using RAPID software. CONTRAST:  156mL ISOVUE-370 IOPAMIDOL (ISOVUE-370) INJECTION 76% COMPARISON:  None available FINDINGS: CT HEAD FINDINGS Brain: No evidence of acute infarction, hemorrhage, hydrocephalus, extra-axial collection or mass lesion/mass effect. Mild volume loss with nonspecific pattern Vascular: See below Skull: Negative Sinuses/Orbits: Negative ASPECTS ([place] Stroke Program Early CT Score) - Ganglionic level infarction (caudate, lentiform nuclei, internal capsule, insula, M1-M3 cortex): 7 - Supraganglionic infarction (M4-M6 cortex): 3 Total score (0-10 with 10 being normal): 10 Review of the MIP images confirms the above findings CTA NECK FINDINGS Aortic arch: Covered portions are negative for dilatation. No dissection seen. There is 2 vessel branching. Partially covered changes of CABG Right carotid system: Intermittent calcified plaque on the anterior wall  of the common carotid. Bulky plaque at the common carotid bifurcation and proximal ICA with stenosis of at least 80% based on coronal reformat measurement. Downstream vessel is patent without beading or dissection. Left carotid system: Moderate calcified plaque on the anterior wall of the common carotid. Bulky plaque at the common carotid bifurcation and proximal ICA. Proximal ICA stenosis measures 40%. No ulceration or beading. Vertebral arteries: No  proximal subclavian flow limiting stenosis. There is atherosclerotic plaque on the left subclavian. Calcified plaque along the bilateral vertebral origin with mild narrowing on the left. Both vertebral arteries are smooth and widely patent to the dura. Skeleton: Bulky spondylosis out of proportion to disc space narrowing and consistent with diffuse idiopathic skeletal hyperostosis. There is encroachment on the hypopharynx and upper esophagus. Multilevel vertebral ankylosis. Other neck: Negative Upper chest: CABG changes noted above. Apical lungs are clear. Review of the MIP images confirms the above findings CTA HEAD FINDINGS Anterior circulation: Atherosclerotic plaque on the bilateral carotid siphons. 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 No focal flow limiting stenosis. Negative for aneurysm. Posterior circulation: The left vertebral artery is dominant. Vertebrobasilar arteries are smooth and widely patent. Robust flow in bilateral vertebrobasilar branches. Venous sinuses: Patent Anatomic variants: None significant Delayed phase: No abnormal intracranial enhancement Review of the MIP images confirms the above findings CT Brain Perfusion Findings: CBF (<30%) Volume: 47mL Perfusion (Tmax>6.0s) volume: 60mL IMPRESSION: 1. No emergent large vessel occlusion. No infarct or penumbra by CT perfusion. 2. Advanced atheromatous narrowing of the proximal right ICA, at least 80%. 3. 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 4. No  intracranial flow limiting stenosis. Electronically Signed   By: Monte Fantasia M.D.   On: 11/15/2017 14:27   Ct Angio Neck W Or Wo Contrast  Result Date: 11/15/2017 CLINICAL DATA:  Arterial stricture/occlusion, head and neck. EXAM: CT ANGIOGRAPHY HEAD AND NECK CT PERFUSION BRAIN TECHNIQUE: Multidetector CT imaging of the head and neck was performed using the standard protocol during bolus administration of intravenous contrast. Multiplanar CT image reconstructions and MIPs were obtained to evaluate the vascular anatomy. Carotid stenosis measurements (when applicable) are obtained utilizing NASCET criteria, using the distal internal carotid diameter as the denominator. Multiphase CT imaging of the brain was performed following IV bolus contrast injection. Subsequent parametric perfusion maps were calculated using RAPID software. CONTRAST:  142mL ISOVUE-370 IOPAMIDOL (ISOVUE-370) INJECTION 76% COMPARISON:  None available FINDINGS: CT HEAD FINDINGS Brain: No evidence of acute infarction, hemorrhage, hydrocephalus, extra-axial collection or mass lesion/mass effect. Mild volume loss with nonspecific pattern Vascular: See below Skull: Negative Sinuses/Orbits: Negative ASPECTS ([place] Stroke Program Early CT Score) - Ganglionic level infarction (caudate, lentiform nuclei, internal capsule, insula, M1-M3 cortex): 7 - Supraganglionic infarction (M4-M6 cortex): 3 Total score (0-10 with 10 being normal): 10 Review of the MIP images confirms the above findings CTA NECK FINDINGS Aortic arch: Covered portions are negative for dilatation. No dissection seen. There is 2 vessel branching. Partially covered changes of CABG Right carotid system: Intermittent calcified plaque on the anterior wall of the common carotid. Bulky plaque at the common carotid bifurcation and proximal ICA with stenosis of at least 80% based on coronal reformat measurement. Downstream vessel is patent without beading or dissection. Left carotid system:  Moderate calcified plaque on the anterior wall of the common carotid. Bulky plaque at the common carotid bifurcation and proximal ICA. Proximal ICA stenosis measures 40%. No ulceration or beading. Vertebral arteries: No proximal subclavian flow limiting stenosis. There is atherosclerotic plaque on the left subclavian. Calcified plaque along the bilateral vertebral origin with mild narrowing on the left. Both vertebral arteries are smooth and widely patent to the dura. Skeleton: Bulky spondylosis out of proportion to disc space narrowing and consistent with diffuse idiopathic skeletal hyperostosis. There is encroachment on the hypopharynx and upper esophagus. Multilevel vertebral ankylosis. Other neck: Negative Upper chest: CABG changes  noted above. Apical lungs are clear. Review of the MIP images confirms the above findings CTA HEAD FINDINGS Anterior circulation: Atherosclerotic plaque on the bilateral carotid siphons. 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 No focal flow limiting stenosis. Negative for aneurysm. Posterior circulation: The left vertebral artery is dominant. Vertebrobasilar arteries are smooth and widely patent. Robust flow in bilateral vertebrobasilar branches. Venous sinuses: Patent Anatomic variants: None significant Delayed phase: No abnormal intracranial enhancement Review of the MIP images confirms the above findings CT Brain Perfusion Findings: CBF (<30%) Volume: 55mL Perfusion (Tmax>6.0s) volume: 50mL IMPRESSION: 1. No emergent large vessel occlusion. No infarct or penumbra by CT perfusion. 2. Advanced atheromatous narrowing of the proximal right ICA, at least 80%. 3. 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 4. No intracranial flow limiting stenosis. Electronically Signed   By: Monte Fantasia M.D.   On: 11/15/2017 14:27   Ct Cerebral Perfusion W Contrast  Result Date: 11/15/2017 CLINICAL DATA:  Arterial stricture/occlusion, head and neck. EXAM: CT  ANGIOGRAPHY HEAD AND NECK CT PERFUSION BRAIN TECHNIQUE: Multidetector CT imaging of the head and neck was performed using the standard protocol during bolus administration of intravenous contrast. Multiplanar CT image reconstructions and MIPs were obtained to evaluate the vascular anatomy. Carotid stenosis measurements (when applicable) are obtained utilizing NASCET criteria, using the distal internal carotid diameter as the denominator. Multiphase CT imaging of the brain was performed following IV bolus contrast injection. Subsequent parametric perfusion maps were calculated using RAPID software. CONTRAST:  138mL ISOVUE-370 IOPAMIDOL (ISOVUE-370) INJECTION 76% COMPARISON:  None available FINDINGS: CT HEAD FINDINGS Brain: No evidence of acute infarction, hemorrhage, hydrocephalus, extra-axial collection or mass lesion/mass effect. Mild volume loss with nonspecific pattern Vascular: See below Skull: Negative Sinuses/Orbits: Negative ASPECTS ([place] Stroke Program Early CT Score) - Ganglionic level infarction (caudate, lentiform nuclei, internal capsule, insula, M1-M3 cortex): 7 - Supraganglionic infarction (M4-M6 cortex): 3 Total score (0-10 with 10 being normal): 10 Review of the MIP images confirms the above findings CTA NECK FINDINGS Aortic arch: Covered portions are negative for dilatation. No dissection seen. There is 2 vessel branching. Partially covered changes of CABG Right carotid system: Intermittent calcified plaque on the anterior wall of the common carotid. Bulky plaque at the common carotid bifurcation and proximal ICA with stenosis of at least 80% based on coronal reformat measurement. Downstream vessel is patent without beading or dissection. Left carotid system: Moderate calcified plaque on the anterior wall of the common carotid. Bulky plaque at the common carotid bifurcation and proximal ICA. Proximal ICA stenosis measures 40%. No ulceration or beading. Vertebral arteries: No proximal subclavian  flow limiting stenosis. There is atherosclerotic plaque on the left subclavian. Calcified plaque along the bilateral vertebral origin with mild narrowing on the left. Both vertebral arteries are smooth and widely patent to the dura. Skeleton: Bulky spondylosis out of proportion to disc space narrowing and consistent with diffuse idiopathic skeletal hyperostosis. There is encroachment on the hypopharynx and upper esophagus. Multilevel vertebral ankylosis. Other neck: Negative Upper chest: CABG changes noted above. Apical lungs are clear. Review of the MIP images confirms the above findings CTA HEAD FINDINGS Anterior circulation: Atherosclerotic plaque on the bilateral carotid siphons. 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 No focal flow limiting stenosis. Negative for aneurysm. Posterior circulation: The left vertebral artery is dominant. Vertebrobasilar arteries are smooth and widely patent. Robust flow in bilateral vertebrobasilar branches. Venous sinuses: Patent Anatomic  variants: None significant Delayed phase: No abnormal intracranial enhancement Review of the MIP images confirms the above findings CT Brain Perfusion Findings: CBF (<30%) Volume: 80mL Perfusion (Tmax>6.0s) volume: 13mL IMPRESSION: 1. No emergent large vessel occlusion. No infarct or penumbra by CT perfusion. 2. Advanced atheromatous narrowing of the proximal right ICA, at least 80%. 3. 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 4. No intracranial flow limiting stenosis. Electronically Signed   By: Monte Fantasia M.D.   On: 11/15/2017 14:27    EKG: Independently reviewed.  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  If stroke is noted we will consider a work-up for stroke.  Patient may be having a migraine or management per Dr. Leonel Ramsay.  2.  Hypertension essential: We will continue benazepril and amlodipine combination continue close blood pressure management.  3.  Diabetes mellitus without complication: Scale insulin protocol.  4.  Coronary artery disease: To new aspirin patient presently on no beta-blocker.  5.  Chronic obstructive pulmonary disease: Patient currently does not take any nebulizers or inhalers for this.  We will continue to monitor.  6.  History of coronary artery bypass surgery: As above.  7.  Hyperlipidemia: Patient takes Livalo at home.  We substitute Pravachol at our facility.  8.  Sleep apnea: Patient instructed to bring in his CPAP machine.  9.  Gastroesophageal reflux disease: Currently on no medications will monitor closely start medications if necessary.    DVT prophylaxis: Subcu Lovenox Code Status: Full code Family Communication: Spoke with patient's wife who is present at the bedside Disposition Plan: Likely home in 24 to 48 hours Consults called: Dr. Leonel Ramsay from neurology Admission status: Observation   Lady Deutscher MD [facility] Pager [phone]  If 7PM-7AM, please contact night-coverage [email or web address] Password [email or web address]  11/15/2017, 5:19 PM

## 2017-11-15 NOTE — ED Notes (Signed)
EEG at bedside.

## 2017-11-15 NOTE — ED Notes (Signed)
Patient transported to CT 

## 2017-11-16 ENCOUNTER — Observation Stay (HOSPITAL_COMMUNITY): Payer: Medicare Other

## 2017-11-16 DIAGNOSIS — R7303 Prediabetes: Secondary | ICD-10-CM

## 2017-11-16 DIAGNOSIS — E785 Hyperlipidemia, unspecified: Secondary | ICD-10-CM

## 2017-11-16 DIAGNOSIS — I10 Essential (primary) hypertension: Secondary | ICD-10-CM | POA: Diagnosis not present

## 2017-11-16 DIAGNOSIS — I639 Cerebral infarction, unspecified: Secondary | ICD-10-CM | POA: Diagnosis not present

## 2017-11-16 DIAGNOSIS — R531 Weakness: Secondary | ICD-10-CM | POA: Diagnosis not present

## 2017-11-16 LAB — GLUCOSE, CAPILLARY
GLUCOSE-CAPILLARY: 88 mg/dL (ref 70–99)
GLUCOSE-CAPILLARY: 90 mg/dL (ref 70–99)
Glucose-Capillary: 152 mg/dL — ABNORMAL HIGH (ref 70–99)

## 2017-11-16 LAB — LIPID PANEL
Cholesterol: 173 mg/dL (ref 0–200)
HDL: 32 mg/dL — AB (ref >40)
LDL CALC: 115 mg/dL — AB (ref 0–99)
Total CHOL/HDL Ratio: 5.4 RATIO
Triglycerides: 131 mg/dL (ref <150)
VLDL: 26 mg/dL (ref 0–40)

## 2017-11-16 LAB — HEMOGLOBIN A1C
HEMOGLOBIN A1C: 6.1 % — AB (ref 4.8–5.6)
Mean Plasma Glucose: 128.37 mg/dL

## 2017-11-16 MED ORDER — ZOLPIDEM TARTRATE 5 MG PO TABS: 5.0000 mg | ORAL_TABLET | Freq: Once | ORAL | Status: DC

## 2017-11-16 NOTE — Evaluation (Addendum)
Speech Language Pathology Evaluation Patient Details Name: Albert Hayes MRN: [id number] DOB: [birth year] Today's Date: 11/16/2017 Time: 2536-6440 SLP Time Calculation (min) (ACUTE ONLY): 55 min  Problem List:  Patient Active Problem List   Diagnosis Date Noted  . Stroke ([place]) 11/15/2017  . 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 Sleep apnea   . Hypertension   . GERD (gastroesophageal reflux disease)   . Diabetes mellitus without complication ([place])   . COPD (chronic obstructive pulmonary disease) ([place])   . Coronary artery disease   . Arteriosclerosis of coronary artery 12/22/2014  . BP (high blood pressure) 12/22/2014  . Chronic obstructive pulmonary disease ([place]) 12/22/2014  . H/O coronary artery bypass surgery 12/22/2014  . HLD (hyperlipidemia) 12/22/2014   Past Medical History:  Past Medical History:  Diagnosis Date  . Anxiety   . Asthma   . BPH (benign prostatic hyperplasia)   . Conduct disorder   . Constipation   . COPD (chronic obstructive pulmonary disease) ([place])   . Coronary artery disease   . Depression   . Diabetes mellitus without complication ([place])   . ED (erectile dysfunction)   . GERD (gastroesophageal reflux disease)   . Heart murmur    EJECTION MURMUR  . History of colon polyps   . HLD (hyperlipidemia)   . Hypertension   . Low back pain   . PTSD (post-traumatic stress disorder)   . Sleep apnea   . Thrombocytopenia ([place])   . Vitamin D deficiency    Past Surgical History:  Past Surgical History:  Procedure Laterality Date  . COLONOSCOPY    . COLONOSCOPY WITH PROPOFOL N/A 10/31/2016   Procedure: COLONOSCOPY WITH PROPOFOL;  Surgeon: Manya Silvas, MD;  [facility] END[facility]COPY;  Service: Endoscopy;  Laterality: N/A;  . CORONARY ANGIOPLASTY    . CORONARY ARTERY BYPASS GRAFT    . ESOPHAGOGASTRODUODENOSCOPY  2016  . SEPTOPLASTY    . spine/bone surgery    . TONSILLECTOMY     HPI:  72 yo male adm to [name] with visual deficits, facial droop - has  h/o anxiey, GERD, asthma.  Pt CT head negative.  Pt admits to concerns for progressive memory deficits- as he reports his grandfather and uncle had dementia.   MRI pending at this time.    Assessment / Plan / Recommendation Clinical Impression  MOCA 7.1 given to pt with him scoring 23/30 - normal being 26 or above.  Difficulties noted primarily in attention/memory.  Pt did not identify 2/5 words even with multiple choice, identified 2/5 and able to name 1/5 with category cue.  Pt with no dysarthria or languge deficits.  He reports concerns for potential progessive memory deficits.  Recommend follow up assessment with neuropsychologist due to pt's concerns.  Pt reviewed multiple strategies to compensate for his memory deficits.  No SLP follow up indicated as pt at baseline function.      SLP Assessment  SLP Recommendation/Assessment: Patient does not need any further Speech Lanaguage Pathology Services SLP Visit Diagnosis: Cognitive communication deficit (R41.841)    Follow Up Recommendations  Other (comment)(neuropsychologist referral for cognitive assessment)    Frequency and Duration           SLP Evaluation Cognition  Overall Cognitive Status: History of cognitive impairments - at baseline(per pt - has memory deficits prior to admit) Orientation Level: Oriented to person;Oriented to place;Oriented to time;Oriented to situation Attention: Sustained Sustained Attention: Impaired Sustained Attention Impairment: Verbal complex Memory: Impaired Memory Impairment: Storage deficit;Retrieval deficit Awareness: Appears  intact Problem Solving: Impaired Safety/Judgment: Appears intact       Comprehension  Auditory Comprehension Overall Auditory Comprehension: Appears within functional limits for tasks assessed Yes/No Questions: Within Functional Limits Commands: Within Functional Limits Conversation: Complex Interfering Components: Attention EffectiveTechniques: Extra processing  time Visual Recognition/Discrimination Discrimination: Within Function Limits Reading Comprehension Reading Status: Not tested(pt reports he was told he has reading comprehension deficits)    Expression Expression Primary Mode of Expression: Verbal Verbal Expression Overall Verbal Expression: Appears within functional limits for tasks assessed Initiation: No impairment Level of Generative/Spontaneous Verbalization: Conversation Repetition: Impaired Level of Impairment: Sentence level(complex sentence level) Naming: No impairment Pragmatics: No impairment Written Expression Dominant Hand: Right Written Expression: Within Functional Limits(for drawing clock, cube)   Oral / Motor  Oral Motor/Sensory Function Overall Oral Motor/Sensory Function: Within functional limits Motor Speech Overall Motor Speech: Appears within functional limits for tasks assessed Respiration: Within functional limits Resonance: Within functional limits Articulation: Within functional limitis Intelligibility: Intelligible Motor Planning: Witnin functional limits   GO                    [name] 11/16/2017, 2:34 PM   [name], [place] [facility] SLP [phone]

## 2017-11-16 NOTE — Evaluation (Signed)
Physical Therapy Evaluation Patient Details Name: LYNNE RIGHI MRN: [id number] DOB: 07-May-1945 Today's Date: 11/16/2017   History of Present Illness  72 y.o. male with medical history significant of high blood pressure, diabetes mellitus, coronary artery disease, chronic obstructive pulmonary disease, hyperlipidemia, sleep apnea, and gastroesophageal reflux disease who presents after being at [place] and found to be used with left arm drift and left visual field disturbances, neuro work up underway.  Clinical Impression  Patient seen for mobility assessment. Mobilizing well. Steady with all activities and higher level balance tasks. Patient reports that this is his mobility baseline. No overt neuro deficits noted at this time. No further acute PT needs. Will sign off.     Follow Up Recommendations No PT follow up    Equipment Recommendations  None recommended by PT    Recommendations for Other Services       Precautions / Restrictions        Mobility  Bed Mobility Overal bed mobility: Independent                Transfers Overall transfer level: Independent Equipment used: None                Ambulation/Gait Ambulation/Gait assistance: Independent Gait Distance (Feet): 280 Feet Assistive device: None Gait Pattern/deviations: Step-through pattern Gait velocity: decreased   General Gait Details: steady with ambulation, rigid gait and posture  Stairs            Wheelchair Mobility    Modified Rankin (Stroke Patients Only)       Balance Overall balance assessment: Modified Independent Sitting-balance support: Feet supported Sitting balance-Leahy Scale: Good     Standing balance support: During functional activity Standing balance-Leahy Scale: Good Standing balance comment: no overt LOB with dynamic tasks performance             High level balance activites: Side stepping;Backward walking;Direction changes;Turns;Sudden stops;Head  turns High Level Balance Comments: steady with higher level mobility tasks             Pertinent Vitals/Pain Pain Assessment: No/denies pain    Home Living Family/patient expects to be discharged to:: Private residence Living Arrangements: Spouse/significant other Available Help at Discharge: Family Type of Home: House Home Access: Stairs to enter Entrance Stairs-Rails: Can reach both Entrance Stairs-Number of Steps: 3 Home Layout: One level Home Equipment: (has equipment but hasnt used it)      Prior Function Level of Independence: Independent               Hand Dominance   Dominant Hand: Right    Extremity/Trunk Assessment   Upper Extremity Assessment Upper Extremity Assessment: Overall WFL for tasks assessed    Lower Extremity Assessment Lower Extremity Assessment: Overall WFL for tasks assessed    Cervical / Trunk Assessment Cervical / Trunk Assessment: (history of spinal stenosis, and spinal sx)  Communication   Communication: No difficulties  Cognition Arousal/Alertness: Awake/alert Behavior During Therapy: WFL for tasks assessed/performed Overall Cognitive Status: Within Functional Limits for tasks assessed                                        General Comments      Exercises     Assessment/Plan    PT Assessment Patent does not need any further PT services  PT Problem List         PT Treatment Interventions  PT Goals (Current goals can be found in the Care Plan section)  Acute Rehab PT Goals PT Goal Formulation: All assessment and education complete, DC therapy    Frequency     Barriers to discharge        Co-evaluation               AM-PAC PT "6 Clicks" Daily Activity  Outcome Measure Difficulty turning over in bed (including adjusting bedclothes, sheets and blankets)?: None Difficulty moving from lying on back to sitting on the side of the bed? : None Difficulty sitting down on and standing up  from a chair with arms (e.g., wheelchair, bedside commode, etc,.)?: None Help needed moving to and from a bed to chair (including a wheelchair)?: None Help needed walking in hospital room?: None Help needed climbing 3-5 steps with a railing? : A Little 6 Click Score: 23    End of Session Equipment Utilized During Treatment: Gait belt Activity Tolerance: Patient tolerated treatment well Patient left: in chair;with call bell/phone within reach;with family/visitor present Nurse Communication: Mobility status PT Visit Diagnosis: Other symptoms and signs involving the nervous system (R29.898)    Time: 0076-2263 PT Time Calculation (min) (ACUTE ONLY): 20 min   Charges:   PT Evaluation $PT Eval Low Complexity: [place], PT DPT  Board Certified Neurologic Specialist [place] 11/16/2017, 1:38 PM

## 2017-11-16 NOTE — Discharge Summary (Signed)
Physician Discharge Summary  Albert Hayes EQA:[id number] DOB: 1945/08/01  PCP: Wendie Agreste, MD  Admit date: 11/15/2017 Discharge date: 11/17/2017  Recommendations for Outpatient Follow-up:  1. Dr. Cindee Lame, PCP in 1 week. 2. Dr. Rosana Hoes, Vascular Surgeon at [facility]: Patient states that he has an appointment on 11/19/2017 and is advised to keep that appointment.  Home Health: None Equipment/Devices: None  Discharge Condition: Improved and stable. CODE STATUS: Full Diet recommendation: Heart healthy & diabetic diet.  Discharge Diagnoses:  Principal Problem:   Stroke [facility]) Active Problems:   BP (high blood pressure)   Chronic obstructive pulmonary disease (HCC)   H/O coronary artery bypass surgery   HLD (hyperlipidemia)   Sleep apnea   GERD (gastroesophageal reflux disease)   Diabetes mellitus without complication (HCC)   Coronary artery disease   Brief Summary: 72 year old married male with PMH of HTN, HLD,?  DM (possibly prediabetes), anxiety, depression, asthma/COPD, CAD status post CABG 2005, GERD, OSA, stated that he was at [place] on day of admission when one of his friends noticed that patient had left facial droop and slurred speech and patient reported left-sided weakness and intermittent visual symptoms.  He also reported some confusion and dyspnea.  His antihypertensives had recently been changed.  He indicated that all his symptoms started following his finding out about his carotid stenosis. He was seen by his physician and sent to the ED for further evaluation.  Neurology was consulted.  Assessment and plan:  Transient left-sided weakness and visual symptoms: DD: Possible migraine versus stroke (felt less likely) versus conversion disorder.  Neurology was consulted and assisted with evaluation and management.  As per neurology, he had some inconsistent findings on physical exam.  He had positive visual symptoms of a slowly progressive  brightly colored "pixilated" scotoma in his left visual field.  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  He to give history of headaches causing him to seek dark rooms, though not any recently.  He did report mild pressure which might be a mild headache on day of admission.  All his symptoms reportedly started following his finding out about his carotid stenosis and the neurologist was wondering if this could be playing a role (conversion", however he was certainly at risk for stroke and hence was evaluated for same.  CTA head and neck with perfusion study: No emergent LVO, no infarct or penumbra by CT, proximal right ICA at least 80% stenosis, left proximal ICA 40% stenosis.  EEG was normal.  I discussed with Neurology who recommended that if MRI brain was negative (which it was for acute findings), patient does not need any further evaluation and can be discharged to follow-up with his vascular surgeon as scheduled on 11/19/2017 and he does not need outpatient Neurology follow-up unless he has frequent episodes of headache/migraine.  Therapies evaluated and did not see any outpatient needs except speech therapy indicated that patient reported concerns for potential progressive memory deficits and recommended follow-up assessment with neuropsychologist due to patient's concerns-deferred to PCP during follow-up.  Right ICA stenosis: Advised to follow-up with vascular surgery on 11/19/2017 as scheduled.  Essential hypertension: Controlled.  Continue prior home medications.  Hyperlipidemia: Continue Livalo.  LDL 115.  Given multiple allergies, defer medication adjustments to his outpatient physician during follow-up.  Prediabetes: A1c 6.1.  Not sure if he was diagnosed as diabetes.  Asthma/COPD:  Stable without clinical bronchospasm.  CAD status post CABG: Asymptomatic of chest pain.  Continue aspirin.  OSA:  Continue CPAP if using at home.  GERD: Asymptomatic and not on medications at home.  Multiple medication allergies: Noted.   Consultations:  Neurology  Procedures:  None   Discharge Instructions  Discharge Instructions    Call MD for:   Complete by:  As directed    Strokelike symptoms.   Call MD for:  difficulty breathing, headache or visual disturbances   Complete by:  As directed    Call MD for:  extreme fatigue   Complete by:  As directed    Call MD for:  persistant dizziness or light-headedness   Complete by:  As directed    Call MD for:  severe uncontrolled pain   Complete by:  As directed    Diet - low sodium heart healthy   Complete by:  As directed    Diet Carb Modified   Complete by:  As directed    Increase activity slowly   Complete by:  As directed        Medication List    TAKE these medications   amLODipine-benazepril 5-10 MG capsule Commonly known as:  LOTREL Take 1 capsule by mouth daily.   aspirin EC 81 MG tablet Take 81 mg by mouth daily.   ibuprofen 200 MG tablet Commonly known as:  ADVIL,MOTRIN Take 200 mg by mouth every 6 (six) hours as needed for headache (pain).   LIVALO 2 MG Tabs Generic drug:  Pitavastatin Calcium Take 2 mg by mouth at bedtime. Reported on 05/25/2015      Follow-up Information    Wendie Agreste, MD. Schedule an appointment as soon as possible for a visit in 1 week(s).   Specialties:  Family Medicine, Sports Medicine Contact information: [place] [address] [phone]        Dr. Odis Luster Surgery Follow up.   Why:  Patient advised to keep prior appointment that he has on 11/19/2017.         Allergies  Allergen Reactions  . Penicillins Other (See Comments)    Unknown childhood allergic reaction - severe Has patient had a PCN reaction causing immediate rash, facial/tongue/throat swelling, SOB or lightheadedness with hypotension: Unknown Has patient had a PCN reaction  causing severe rash involving mucus membranes or skin necrosis: Unknown Has patient had a PCN reaction that required hospitalization: Yes Has patient had a PCN reaction occurring within the last 10 years: No If all of the above answers are "NO", then may proceed with Cephalosporin use.  . Xanax [Alprazolam] Shortness Of Breath  . Bupropion Other (See Comments)    Unknown reaction  . Citalopram Other (See Comments)    Unknown reaction  . Cymbalta [Duloxetine Hcl] Other (See Comments)    Unknown reaction  . Fluoxetine Other (See Comments)    Unknown reaction  . Fluvoxamine Other (See Comments)    Unknown reaction  . Lexapro [Escitalopram Oxalate] Other (See Comments)    Unknown reaction  . Niacin And Related Other (See Comments)    Unknown reaction  . Oxcarbazepine Other (See Comments)    Unknown reaction  . Pravastatin Other (See Comments)    Unknown reaction  . Seroquel [Quetiapine Fumarate] Other (See Comments)    Unknown reaction  . Statins Other (See Comments)    Unknown adverse reaction  . Tamsulosin Other (See Comments)    Unknown reaction  . Zoloft [Sertraline Hcl] Other (See  Comments)    Unknown reaction      Procedures/Studies: Ct Angio Head W Or Wo Contrast  Result Date: 11/15/2017 CLINICAL DATA:  Arterial stricture/occlusion, head and neck. EXAM: CT ANGIOGRAPHY HEAD AND NECK CT PERFUSION BRAIN TECHNIQUE: Multidetector CT imaging of the head and neck was performed using the standard protocol during bolus administration of intravenous contrast. Multiplanar CT image reconstructions and MIPs were obtained to evaluate the vascular anatomy. Carotid stenosis measurements (when applicable) are obtained utilizing NASCET criteria, using the distal internal carotid diameter as the denominator. Multiphase CT imaging of the brain was performed following IV bolus contrast injection. Subsequent parametric perfusion maps were calculated using RAPID software. CONTRAST:  162mL ISOVUE-370  IOPAMIDOL (ISOVUE-370) INJECTION 76% COMPARISON:  None available FINDINGS: CT HEAD FINDINGS Brain: No evidence of acute infarction, hemorrhage, hydrocephalus, extra-axial collection or mass lesion/mass effect. Mild volume loss with nonspecific pattern Vascular: See below Skull: Negative Sinuses/Orbits: Negative ASPECTS ([place] Stroke Program Early CT Score) - Ganglionic level infarction (caudate, lentiform nuclei, internal capsule, insula, M1-M3 cortex): 7 - Supraganglionic infarction (M4-M6 cortex): 3 Total score (0-10 with 10 being normal): 10 Review of the MIP images confirms the above findings CTA NECK FINDINGS Aortic arch: Covered portions are negative for dilatation. No dissection seen. There is 2 vessel branching. Partially covered changes of CABG Right carotid system: Intermittent calcified plaque on the anterior wall of the common carotid. Bulky plaque at the common carotid bifurcation and proximal ICA with stenosis of at least 80% based on coronal reformat measurement. Downstream vessel is patent without beading or dissection. Left carotid system: Moderate calcified plaque on the anterior wall of the common carotid. Bulky plaque at the common carotid bifurcation and proximal ICA. Proximal ICA stenosis measures 40%. No ulceration or beading. Vertebral arteries: No proximal subclavian flow limiting stenosis. There is atherosclerotic plaque on the left subclavian. Calcified plaque along the bilateral vertebral origin with mild narrowing on the left. Both vertebral arteries are smooth and widely patent to the dura. Skeleton: Bulky spondylosis out of proportion to disc space narrowing and consistent with diffuse idiopathic skeletal hyperostosis. There is encroachment on the hypopharynx and upper esophagus. Multilevel vertebral ankylosis. Other neck: Negative Upper chest: CABG changes noted above. Apical lungs are clear. Review of the MIP images confirms the above findings CTA HEAD FINDINGS Anterior  circulation: Atherosclerotic plaque on the bilateral carotid siphons. 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 No focal flow limiting stenosis. Negative for aneurysm. Posterior circulation: The left vertebral artery is dominant. Vertebrobasilar arteries are smooth and widely patent. Robust flow in bilateral vertebrobasilar branches. Venous sinuses: Patent Anatomic variants: None significant Delayed phase: No abnormal intracranial enhancement Review of the MIP images confirms the above findings CT Brain Perfusion Findings: CBF (<30%) Volume: 30mL Perfusion (Tmax>6.0s) volume: 46mL IMPRESSION: 1. No emergent large vessel occlusion. No infarct or penumbra by CT perfusion. 2. Advanced atheromatous narrowing of the proximal right ICA, at least 80%. 3. 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 4. No intracranial flow limiting stenosis. Electronically Signed   By: Monte Fantasia M.D.   On: 11/15/2017 14:27   Ct Angio Neck W Or Wo Contrast  Result Date: 11/15/2017 CLINICAL DATA:  Arterial stricture/occlusion, head and neck. EXAM: CT ANGIOGRAPHY HEAD AND NECK CT PERFUSION BRAIN TECHNIQUE: Multidetector CT imaging of the head and neck was performed using the standard protocol during bolus administration of intravenous contrast. Multiplanar CT image reconstructions and MIPs were obtained to evaluate the vascular  anatomy. Carotid stenosis measurements (when applicable) are obtained utilizing NASCET criteria, using the distal internal carotid diameter as the denominator. Multiphase CT imaging of the brain was performed following IV bolus contrast injection. Subsequent parametric perfusion maps were calculated using RAPID software. CONTRAST:  184mL ISOVUE-370 IOPAMIDOL (ISOVUE-370) INJECTION 76% COMPARISON:  None available FINDINGS: CT HEAD FINDINGS Brain: No evidence of acute infarction, hemorrhage, hydrocephalus, extra-axial collection or mass lesion/mass effect. Mild volume loss with  nonspecific pattern Vascular: See below Skull: Negative Sinuses/Orbits: Negative ASPECTS ([place] Stroke Program Early CT Score) - Ganglionic level infarction (caudate, lentiform nuclei, internal capsule, insula, M1-M3 cortex): 7 - Supraganglionic infarction (M4-M6 cortex): 3 Total score (0-10 with 10 being normal): 10 Review of the MIP images confirms the above findings CTA NECK FINDINGS Aortic arch: Covered portions are negative for dilatation. No dissection seen. There is 2 vessel branching. Partially covered changes of CABG Right carotid system: Intermittent calcified plaque on the anterior wall of the common carotid. Bulky plaque at the common carotid bifurcation and proximal ICA with stenosis of at least 80% based on coronal reformat measurement. Downstream vessel is patent without beading or dissection. Left carotid system: Moderate calcified plaque on the anterior wall of the common carotid. Bulky plaque at the common carotid bifurcation and proximal ICA. Proximal ICA stenosis measures 40%. No ulceration or beading. Vertebral arteries: No proximal subclavian flow limiting stenosis. There is atherosclerotic plaque on the left subclavian. Calcified plaque along the bilateral vertebral origin with mild narrowing on the left. Both vertebral arteries are smooth and widely patent to the dura. Skeleton: Bulky spondylosis out of proportion to disc space narrowing and consistent with diffuse idiopathic skeletal hyperostosis. There is encroachment on the hypopharynx and upper esophagus. Multilevel vertebral ankylosis. Other neck: Negative Upper chest: CABG changes noted above. Apical lungs are clear. Review of the MIP images confirms the above findings CTA HEAD FINDINGS Anterior circulation: Atherosclerotic plaque on the bilateral carotid siphons. 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 No focal flow limiting stenosis. Negative for aneurysm. Posterior circulation: The left  vertebral artery is dominant. Vertebrobasilar arteries are smooth and widely patent. Robust flow in bilateral vertebrobasilar branches. Venous sinuses: Patent Anatomic variants: None significant Delayed phase: No abnormal intracranial enhancement Review of the MIP images confirms the above findings CT Brain Perfusion Findings: CBF (<30%) Volume: 28mL Perfusion (Tmax>6.0s) volume: 57mL IMPRESSION: 1. No emergent large vessel occlusion. No infarct or penumbra by CT perfusion. 2. Advanced atheromatous narrowing of the proximal right ICA, at least 80%. 3. 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 4. No intracranial flow limiting stenosis. Electronically Signed   By: Monte Fantasia M.D.   On: 11/15/2017 14:27   Mr Brain Wo Contrast  Result Date: 11/16/2017 CLINICAL DATA:  Stroke, follow-up. Stroke symptoms yesterday. Right internal carotid artery stenosis. EXAM: MRI HEAD WITHOUT CONTRAST TECHNIQUE: Multiplanar, multiecho pulse sequences of the brain and surrounding structures were obtained without intravenous contrast. COMPARISON:  The diffusion-weighted images demonstrate no acute or subacute infarction. FINDINGS: Brain: Moderate atrophy and mild white matter changes are present bilaterally. This likely reflects the sequela of chronic microvascular ischemia. White matter changes extend into the brainstem. The cerebellum is within normal limits. No hemorrhage or mass lesion is present. The ventricles are normal size. Vascular: Flow is present in the major intracranial arteries. Skull and upper cervical spine: The skull base is within normal limits. Sinuses/Orbits: The paranasal sinuses are clear. There is minimal fluid in the right mastoid  air cells. No obstructing nasopharyngeal lesion is present. Bilateral lens replacements are present. Globes and orbits are within normal limits. IMPRESSION: 1. No acute or subacute infarct. 2. Moderate atrophy and mild white matter changes are advanced for age. Electronically Signed    By: San Morelle M.D.   On: 11/16/2017 17:41   Ct Cerebral Perfusion W Contrast  Result Date: 11/15/2017 CLINICAL DATA:  Arterial stricture/occlusion, head and neck. EXAM: CT ANGIOGRAPHY HEAD AND NECK CT PERFUSION BRAIN TECHNIQUE: Multidetector CT imaging of the head and neck was performed using the standard protocol during bolus administration of intravenous contrast. Multiplanar CT image reconstructions and MIPs were obtained to evaluate the vascular anatomy. Carotid stenosis measurements (when applicable) are obtained utilizing NASCET criteria, using the distal internal carotid diameter as the denominator. Multiphase CT imaging of the brain was performed following IV bolus contrast injection. Subsequent parametric perfusion maps were calculated using RAPID software. CONTRAST:  174mL ISOVUE-370 IOPAMIDOL (ISOVUE-370) INJECTION 76% COMPARISON:  None available FINDINGS: CT HEAD FINDINGS Brain: No evidence of acute infarction, hemorrhage, hydrocephalus, extra-axial collection or mass lesion/mass effect. Mild volume loss with nonspecific pattern Vascular: See below Skull: Negative Sinuses/Orbits: Negative ASPECTS ([place] Stroke Program Early CT Score) - Ganglionic level infarction (caudate, lentiform nuclei, internal capsule, insula, M1-M3 cortex): 7 - Supraganglionic infarction (M4-M6 cortex): 3 Total score (0-10 with 10 being normal): 10 Review of the MIP images confirms the above findings CTA NECK FINDINGS Aortic arch: Covered portions are negative for dilatation. No dissection seen. There is 2 vessel branching. Partially covered changes of CABG Right carotid system: Intermittent calcified plaque on the anterior wall of the common carotid. Bulky plaque at the common carotid bifurcation and proximal ICA with stenosis of at least 80% based on coronal reformat measurement. Downstream vessel is patent without beading or dissection. Left carotid system: Moderate calcified plaque on the anterior wall of the  common carotid. Bulky plaque at the common carotid bifurcation and proximal ICA. Proximal ICA stenosis measures 40%. No ulceration or beading. Vertebral arteries: No proximal subclavian flow limiting stenosis. There is atherosclerotic plaque on the left subclavian. Calcified plaque along the bilateral vertebral origin with mild narrowing on the left. Both vertebral arteries are smooth and widely patent to the dura. Skeleton: Bulky spondylosis out of proportion to disc space narrowing and consistent with diffuse idiopathic skeletal hyperostosis. There is encroachment on the hypopharynx and upper esophagus. Multilevel vertebral ankylosis. Other neck: Negative Upper chest: CABG changes noted above. Apical lungs are clear. Review of the MIP images confirms the above findings CTA HEAD FINDINGS Anterior circulation: Atherosclerotic plaque on the bilateral carotid siphons. 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 No focal flow limiting stenosis. Negative for aneurysm. Posterior circulation: The left vertebral artery is dominant. Vertebrobasilar arteries are smooth and widely patent. Robust flow in bilateral vertebrobasilar branches. Venous sinuses: Patent Anatomic variants: None significant Delayed phase: No abnormal intracranial enhancement Review of the MIP images confirms the above findings CT Brain Perfusion Findings: CBF (<30%) Volume: 77mL Perfusion (Tmax>6.0s) volume: 15mL IMPRESSION: 1. No emergent large vessel occlusion. No infarct or penumbra by CT perfusion. 2. Advanced atheromatous narrowing of the proximal right ICA, at least 80%. 3. 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 4. No intracranial flow limiting stenosis. Electronically Signed   By: Monte Fantasia M.D.   On: 11/15/2017 14:27      Subjective: Patient interviewed and examined with spouse at bedside.  Reports that he feels much better.  Left-sided weakness has almost resolved and strength back to baseline.   Resolved visual symptoms without recurrence.  No chest pain, dyspnea, dizziness, lightheadedness or headache reported.  He stated that he did not think migraine could present like this.  Discharge Exam:  Vitals:   11/16/17 0810 11/16/17 1419 11/16/17 1421 11/16/17 1606  BP: 129/81 (!) 116/57    Pulse: 69 68 64   Resp: 16 17 14    Temp: 98 F (36.7 C)  98.2 F (36.8 C) 98 F (36.7 C)  TempSrc: Oral  Oral Axillary  SpO2: 97% 98% 98%   Weight:      Height:        General: Pleasant elderly male, moderately built and nourished, sitting up comfortably in chair. Cardiovascular: S1 & S2 heard, RRR, S1/S2 +. No murmurs, rubs, gallops or clicks. No JVD or pedal edema.  Telemetry personally reviewed: Sinus rhythm. Respiratory: Clear to auscultation without wheezing, rhonchi or crackles. No increased work of breathing. Abdominal:  Non distended, non tender & soft. No organomegaly or masses appreciated. Normal bowel sounds heard. CNS: Alert and oriented. No focal deficits.  No pronator drift. Extremities: no edema, no cyanosis    The results of significant diagnostics from this hospitalization (including imaging, microbiology, ancillary and laboratory) are listed below for reference.     Microbiology: No results found for this or any previous visit (from the past 240 hour(s)).   Labs: CBC: Recent Labs  Lab 11/15/17 1135 11/15/17 1155  WBC 6.7  --   NEUTROABS 4.0  --   HGB 15.9 16.3  HCT 48.4 48.0  MCV 88.5  --   PLT 213  --    Basic Metabolic Panel: Recent Labs  Lab 11/15/17 1135 11/15/17 1155  NA 139 138  K 4.1 4.1  CL 102 100  CO2 28  --   GLUCOSE 97 94  BUN 10 12  CREATININE 0.97 0.90  CALCIUM 8.9  --    Liver Function Tests: Recent Labs  Lab 11/15/17 1135  AST 20  ALT 17  ALKPHOS 62  BILITOT 1.2  PROT 6.8  ALBUMIN 4.1   CBG: Recent Labs  Lab 11/15/17 1208 11/15/17 2113 11/16/17 0655 11/16/17 1229 11/16/17 1637  GLUCAP 85 147* 88 90 152*   Hgb  A1c Recent Labs    11/16/17 0517  HGBA1C 6.1*   Lipid Profile Recent Labs    11/16/17 0517  CHOL 173  HDL 32*  LDLCALC 115*  TRIG 131  CHOLHDL 5.4   Urinalysis    Component Value Date/Time   COLORURINE COLORLESS (A) 11/15/2017 1203   APPEARANCEUR CLEAR 11/15/2017 1203   LABSPEC 1.002 (L) 11/15/2017 1203   PHURINE 8.0 11/15/2017 1203   GLUCOSEU NEGATIVE 11/15/2017 1203   [place] 11/15/2017 [place] 11/15/2017 [place] 11/15/2017 1203   PROTEINUR NEGATIVE 11/15/2017 1203   NITRITE NEGATIVE 11/15/2017 1203   LEUKOCYTESUR NEGATIVE 11/15/2017 1203      Time coordinating discharge: 25 minutes  SIGNED:  Vernell Leep, MD, FACP, [facility]. [facility] Pager [phone]  If 7PM-7AM, please contact night-coverage [email or web address] Password [facility] 11/17/2017, 3:55 PM

## 2017-11-16 NOTE — Discharge Instructions (Signed)

## 2017-11-16 NOTE — Progress Notes (Signed)
Occupational Therapy Evaluation Patient Details Name: Albert Hayes MRN: [id number] DOB: 1945-04-15 Today's Date: 11/16/2017    History of Present Illness 72 y.o. male with medical history significant of high blood pressure, diabetes mellitus, coronary artery disease, chronic obstructive pulmonary disease, hyperlipidemia, sleep apnea, and gastroesophageal reflux disease who presents after being at [facility] and found to be used with left arm drift and left visual field disturbances, neuro work up underway.   Clinical Impression   PTA, pt lived with his wife at home and was independent with ADL and mobility. Pt currently appears to be at his baseline level of functioning. Pt reports he has a "blockage" in his R neck for which he is consulting surgery on Tuesday. Reports his MD recently changed his BP medication @ 1 week before this "event" to attempt to lower his BP. Completed education regarding signs/symptoms of CVA using BeFast. No further OT needed at this time.     Follow Up Recommendations  No OT follow up    Equipment Recommendations  None recommended by OT    Recommendations for Other Services       Precautions / Restrictions Precautions Precautions: None      Mobility Bed Mobility Overal bed mobility: Independent                Transfers Overall transfer level: Independent Equipment used: None                  Balance Overall balance assessment: Modified Independent Sitting-balance support: Feet supported Sitting balance-Leahy Scale: Good     Standing balance support: During functional activity Standing balance-Leahy Scale: Good Standing balance comment: no overt LOB with dynamic tasks performance                     ADL either performed or assessed with clinical judgement   ADL Overall ADL's : At baseline                                             Vision Baseline Vision/History: Wears glasses Wears Glasses:  Reading only Vision Assessment?: Yes Eye Alignment: Within Functional Limits Ocular Range of Motion: Within Functional Limits Alignment/Gaze Preference: Within Defined Limits Tracking/Visual Pursuits: Able to track stimulus in all quads without difficulty Saccades: Within functional limits Convergence: Within functional limits Visual Fields: No apparent deficits Additional Comments: Pt reports he has floaters normally; Ableto draw symptoms he was experiencing yesterday which appeared to be a L field cut on both eyes. States this has resolved; dry eye L eye     Perception     Praxis      Pertinent Vitals/Pain Pain Assessment: No/denies pain     Hand Dominance Right   Extremity/Trunk Assessment Upper Extremity Assessment Upper Extremity Assessment: Overall WFL for tasks assessed   Lower Extremity Assessment Lower Extremity Assessment: Overall WFL for tasks assessed   Cervical / Trunk Assessment Cervical / Trunk Assessment: Normal(history of spinal stenosis, and spinal sx)   Communication Communication Communication: No difficulties   Cognition Arousal/Alertness: Awake/alert Behavior During Therapy: WFL for tasks assessed/performed Overall Cognitive Status: Within Functional Limits for tasks assessed                                     General Comments  Exercises     Shoulder Instructions      Home Living Family/patient expects to be discharged to:: Private residence Living Arrangements: Spouse/significant other Available Help at Discharge: Family Type of Home: House Home Access: Stairs to enter Technical brewer of Steps: 3 Entrance Stairs-Rails: Can reach both Home Layout: One level     Bathroom Shower/Tub: Occupational psychologist: Standard Bathroom Accessibility: Yes How Accessible: Accessible via walker Home Equipment: None(has equipment but hasnt used it)          Prior Functioning/Environment Level of  Independence: Independent                 OT Problem List: Impaired vision/perception      OT Treatment/Interventions:      OT Goals(Current goals can be found in the care plan section) Acute Rehab OT Goals Patient Stated Goal: to find out what happened OT Goal Formulation: All assessment and education complete, DC therapy  OT Frequency:     Barriers to D/C:            Co-evaluation              AM-PAC PT "6 Clicks" Daily Activity     Outcome Measure Help from another person eating meals?: None Help from another person taking care of personal grooming?: None Help from another person toileting, which includes using toliet, bedpan, or urinal?: None Help from another person bathing (including washing, rinsing, drying)?: None Help from another person to put on and taking off regular upper body clothing?: None Help from another person to put on and taking off regular lower body clothing?: None 6 Click Score: 24   End of Session Nurse Communication: Mobility status  Activity Tolerance: Patient tolerated treatment well Patient left: in chair;with call bell/phone within reach;with family/visitor present  OT Visit Diagnosis: Low vision, both eyes (H54.2)                Time: 6244-6950 OT Time Calculation (min): 21 min Charges:  OT General Charges $OT Visit: 1 Visit OT Evaluation $OT Eval Low Complexity: [place], OT/L  OT Clinical Specialist [phone]   [facility] 11/16/2017, 2:12 PM

## 2017-11-16 NOTE — Progress Notes (Addendum)
NEURO HOSPITALIST PROGRESS NOTE   Subjective Patient awake, alert, in bed, NAD on RA. Wife at bedside. Patient states that he" feels 100% better than he did yesterday". Denies any CP, SOB or return of symptoms overnight. He is able to lift his left leg better than yesterday, and there was no left arm drift present on exam today. Exam: Vitals:   11/16/17 0048 11/16/17 0358  BP: 112/68 123/74  Pulse: 70 66  Resp: 11 16  Temp: 97.9 F (36.6 C) 98.1 F (36.7 C)  SpO2: 96% 98%    Physical Exam   HEENT-  Normocephalic, no lesions, without obvious abnormality.  Normal external eye and conjunctiva.   Cardiovascular- S1-S2 audible, pulses palpable throughout   Lungs-no rhonchi or wheezing noted, no excessive working breathing.  Saturations within normal limits on RA Abdomen- All 4 quadrants palpated and non tender; BS  Present in all 4 quadrants Extremities- Warm, dry and intact Musculoskeletal-no joint tenderness, deformity or swelling Skin-warm and dry, no hyperpigmentation, vitiligo, or suspicious lesions   Neuro:  Mental Status: Alert, oriented, thought content appropriate.  Speech fluent without evidence of aphasia.  Able to follow  commands without difficulty. Cranial Nerves: II:  Visual fields grossly normal,  III,IV, ZJ:QBHALP not present, extra-ocular motions intact bilaterally, pupils anisocoric 73mm difference. Left > right , round and reactive to light and accomodation.  V,VII: smile symmetric, facial light touch sensation normal bilaterally VIII: hearing normal bilaterally IX,X: uvula rises symmetrically XI: bilateral shoulder shrug XII: midline tongue extension Motor: Right : Upper extremity   5/5    Left:     Upper extremity   5/5  Lower extremity   5/5     Lower extremity   5/5 Tone and bulk:normal tone throughout; no atrophy noted Sensory: light touch intact throughout, bilaterally Deep Tendon Reflexes: 2+ and symmetric biceps,  patella Plantars: Right: downgoing   Left: downgoing Cerebellar: normal finger-to-nose,  and right HTS is slightly ataxic; left HTS has improved. Patient can move his heel closer to his shin today; but stated " his muscles are tight and he has never been able to place his left heel on his knee. Gait: not tested    Medications:  Scheduled: . aspirin EC  81 mg Oral Daily  . benazepril  10 mg Oral Daily  . enoxaparin (LOVENOX) injection  40 mg Subcutaneous Q24H  . insulin aspart  0-15 Units Subcutaneous TID WC  . pravastatin  40 mg Oral q1800  . zolpidem  5 mg Oral Once   Continuous: . sodium chloride 100 mL/hr at 11/16/17 0607   FXT:[id number] **OR** acetaminophen (TYLENOL) oral liquid 160 mg/5 mL **OR** acetaminophen, senna-docusate  Pertinent Labs/Diagnostics:   Ct Angio Head W Or Wo Contrast  Result Date: 11/15/2017 CLINICAL DATA:  Arterial stricture/occlusion, head and neck. EXAM: CT ANGIOGRAPHY HEAD AND NECK CT PERFUSION BRAIN TECHNIQUE: Multidetector CT imaging of the head and neck was performed using the standard protocol during bolus administration of intravenous contrast. Multiplanar CT image reconstructions and MIPs were obtained to evaluate the vascular anatomy. Carotid stenosis measurements (when applicable) are obtained utilizing NASCET criteria, using the distal internal carotid diameter as the denominator. Multiphase CT imaging of the brain was performed following IV bolus contrast injection. Subsequent parametric perfusion maps were calculated using RAPID software. CONTRAST:  180mL ISOVUE-370 IOPAMIDOL (ISOVUE-370) INJECTION 76% COMPARISON:  None available FINDINGS: CT HEAD  FINDINGS Brain: No evidence of acute infarction, hemorrhage, hydrocephalus, extra-axial collection or mass lesion/mass effect. Mild volume loss with nonspecific pattern Vascular: See below Skull: Negative Sinuses/Orbits: Negative ASPECTS ([place] Stroke Program Early CT Score) - Ganglionic level  infarction (caudate, lentiform nuclei, internal capsule, insula, M1-M3 cortex): 7 - Supraganglionic infarction (M4-M6 cortex): 3 Total score (0-10 with 10 being normal): 10 Review of the MIP images confirms the above findings CTA NECK FINDINGS Aortic arch: Covered portions are negative for dilatation. No dissection seen. There is 2 vessel branching. Partially covered changes of CABG Right carotid system: Intermittent calcified plaque on the anterior wall of the common carotid. Bulky plaque at the common carotid bifurcation and proximal ICA with stenosis of at least 80% based on coronal reformat measurement. Downstream vessel is patent without beading or dissection. Left carotid system: Moderate calcified plaque on the anterior wall of the common carotid. Bulky plaque at the common carotid bifurcation and proximal ICA. Proximal ICA stenosis measures 40%. No ulceration or beading. Vertebral arteries: No proximal subclavian flow limiting stenosis. There is atherosclerotic plaque on the left subclavian. Calcified plaque along the bilateral vertebral origin with mild narrowing on the left. Both vertebral arteries are smooth and widely patent to the dura. Skeleton: Bulky spondylosis out of proportion to disc space narrowing and consistent with diffuse idiopathic skeletal hyperostosis. There is encroachment on the hypopharynx and upper esophagus. Multilevel vertebral ankylosis. Other neck: Negative Upper chest: CABG changes noted above. Apical lungs are clear. Review of the MIP images confirms the above findings CTA HEAD FINDINGS Anterior circulation: Atherosclerotic plaque on the bilateral carotid siphons. 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 No focal flow limiting stenosis. Negative for aneurysm. Posterior circulation: The left vertebral artery is dominant. Vertebrobasilar arteries are smooth and widely patent. Robust flow in bilateral vertebrobasilar branches. Venous sinuses:  Patent Anatomic variants: None significant Delayed phase: No abnormal intracranial enhancement Review of the MIP images confirms the above findings CT Brain Perfusion Findings: CBF (<30%) Volume: 76mL Perfusion (Tmax>6.0s) volume: 78mL IMPRESSION: 1. No emergent large vessel occlusion. No infarct or penumbra by CT perfusion. 2. Advanced atheromatous narrowing of the proximal right ICA, at least 80%. 3. 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 4. No intracranial flow limiting stenosis. Electronically Signed   By: Monte Fantasia M.D.   On: 11/15/2017 14:27   Ct Angio Neck W Or Wo Contrast  Result Date: 11/15/2017 CLINICAL DATA:  Arterial stricture/occlusion, head and neck. EXAM: CT ANGIOGRAPHY HEAD AND NECK CT PERFUSION BRAIN TECHNIQUE: Multidetector CT imaging of the head and neck was performed using the standard protocol during bolus administration of intravenous contrast. Multiplanar CT image reconstructions and MIPs were obtained to evaluate the vascular anatomy. Carotid stenosis measurements (when applicable) are obtained utilizing NASCET criteria, using the distal internal carotid diameter as the denominator. Multiphase CT imaging of the brain was performed following IV bolus contrast injection. Subsequent parametric perfusion maps were calculated using RAPID software. CONTRAST:  157mL ISOVUE-370 IOPAMIDOL (ISOVUE-370) INJECTION 76% COMPARISON:  None available FINDINGS: CT HEAD FINDINGS Brain: No evidence of acute infarction, hemorrhage, hydrocephalus, extra-axial collection or mass lesion/mass effect. Mild volume loss with nonspecific pattern Vascular: See below Skull: Negative Sinuses/Orbits: Negative ASPECTS ([place] Stroke Program Early CT Score) - Ganglionic level infarction (caudate, lentiform nuclei, internal capsule, insula, M1-M3 cortex): 7 - Supraganglionic infarction (M4-M6 cortex): 3 Total score (0-10 with 10 being normal): 10 Review of the MIP images confirms the above findings CTA NECK  FINDINGS  Aortic arch: Covered portions are negative for dilatation. No dissection seen. There is 2 vessel branching. Partially covered changes of CABG Right carotid system: Intermittent calcified plaque on the anterior wall of the common carotid. Bulky plaque at the common carotid bifurcation and proximal ICA with stenosis of at least 80% based on coronal reformat measurement. Downstream vessel is patent without beading or dissection. Left carotid system: Moderate calcified plaque on the anterior wall of the common carotid. Bulky plaque at the common carotid bifurcation and proximal ICA. Proximal ICA stenosis measures 40%. No ulceration or beading. Vertebral arteries: No proximal subclavian flow limiting stenosis. There is atherosclerotic plaque on the left subclavian. Calcified plaque along the bilateral vertebral origin with mild narrowing on the left. Both vertebral arteries are smooth and widely patent to the dura. Skeleton: Bulky spondylosis out of proportion to disc space narrowing and consistent with diffuse idiopathic skeletal hyperostosis. There is encroachment on the hypopharynx and upper esophagus. Multilevel vertebral ankylosis. Other neck: Negative Upper chest: CABG changes noted above. Apical lungs are clear. Review of the MIP images confirms the above findings CTA HEAD FINDINGS Anterior circulation: Atherosclerotic plaque on the bilateral carotid siphons. 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 No focal flow limiting stenosis. Negative for aneurysm. Posterior circulation: The left vertebral artery is dominant. Vertebrobasilar arteries are smooth and widely patent. Robust flow in bilateral vertebrobasilar branches. Venous sinuses: Patent Anatomic variants: None significant Delayed phase: No abnormal intracranial enhancement Review of the MIP images confirms the above findings CT Brain Perfusion Findings: CBF (<30%) Volume: 42mL Perfusion (Tmax>6.0s) volume: 55mL IMPRESSION:  1. No emergent large vessel occlusion. No infarct or penumbra by CT perfusion. 2. Advanced atheromatous narrowing of the proximal right ICA, at least 80%. 3. 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 4. No intracranial flow limiting stenosis. Electronically Signed   By: Monte Fantasia M.D.   On: 11/15/2017 14:27   Ct Cerebral Perfusion W Contrast  Result Date: 11/15/2017 CLINICAL DATA:  Arterial stricture/occlusion, head and neck. EXAM: CT ANGIOGRAPHY HEAD AND NECK CT PERFUSION BRAIN TECHNIQUE: Multidetector CT imaging of the head and neck was performed using the standard protocol during bolus administration of intravenous contrast. Multiplanar CT image reconstructions and MIPs were obtained to evaluate the vascular anatomy. Carotid stenosis measurements (when applicable) are obtained utilizing NASCET criteria, using the distal internal carotid diameter as the denominator. Multiphase CT imaging of the brain was performed following IV bolus contrast injection. Subsequent parametric perfusion maps were calculated using RAPID software. CONTRAST:  119mL ISOVUE-370 IOPAMIDOL (ISOVUE-370) INJECTION 76% COMPARISON:  None available FINDINGS: CT HEAD FINDINGS Brain: No evidence of acute infarction, hemorrhage, hydrocephalus, extra-axial collection or mass lesion/mass effect. Mild volume loss with nonspecific pattern Vascular: See below Skull: Negative Sinuses/Orbits: Negative ASPECTS ([place] Stroke Program Early CT Score) - Ganglionic level infarction (caudate, lentiform nuclei, internal capsule, insula, M1-M3 cortex): 7 - Supraganglionic infarction (M4-M6 cortex): 3 Total score (0-10 with 10 being normal): 10 Review of the MIP images confirms the above findings CTA NECK FINDINGS Aortic arch: Covered portions are negative for dilatation. No dissection seen. There is 2 vessel branching. Partially covered changes of CABG Right carotid system: Intermittent calcified plaque on the anterior wall of the common carotid. Bulky  plaque at the common carotid bifurcation and proximal ICA with stenosis of at least 80% based on coronal reformat measurement. Downstream vessel is patent without beading or dissection. Left carotid system: Moderate calcified plaque on the anterior  wall of the common carotid. Bulky plaque at the common carotid bifurcation and proximal ICA. Proximal ICA stenosis measures 40%. No ulceration or beading. Vertebral arteries: No proximal subclavian flow limiting stenosis. There is atherosclerotic plaque on the left subclavian. Calcified plaque along the bilateral vertebral origin with mild narrowing on the left. Both vertebral arteries are smooth and widely patent to the dura. Skeleton: Bulky spondylosis out of proportion to disc space narrowing and consistent with diffuse idiopathic skeletal hyperostosis. There is encroachment on the hypopharynx and upper esophagus. Multilevel vertebral ankylosis. Other neck: Negative Upper chest: CABG changes noted above. Apical lungs are clear. Review of the MIP images confirms the above findings CTA HEAD FINDINGS Anterior circulation: Atherosclerotic plaque on the bilateral carotid siphons. 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 No focal flow limiting stenosis. Negative for aneurysm. Posterior circulation: The left vertebral artery is dominant. Vertebrobasilar arteries are smooth and widely patent. Robust flow in bilateral vertebrobasilar branches. Venous sinuses: Patent Anatomic variants: None significant Delayed phase: No abnormal intracranial enhancement Review of the MIP images confirms the above findings CT Brain Perfusion Findings: CBF (<30%) Volume: 65mL Perfusion (Tmax>6.0s) volume: 47mL IMPRESSION: 1. No emergent large vessel occlusion. No infarct or penumbra by CT perfusion. 2. Advanced atheromatous narrowing of the proximal right ICA, at least 80%. 3. 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 4. No intracranial flow limiting stenosis.  Electronically Signed   By: Monte Fantasia M.D.   On: 11/15/2017 14:27    Impression: 72 y.o. male with positive visual symptoms of a slowly progressing brightly colored "pixelated" scotoma in his left visual field.  He has a strong family history of migraines, and he has a history of headaches causing him to seek dark rooms, though not any recently.  He does have a mild pressure which might be a mild headache currently.  All the symptoms started following his finding out about his carotid stenosis,  and I wonder if this could be playing a role, however he is certainly at risk for stroke and this needs to be assessed for.   - seizure-  Routine EEG results pending - stroke- pending MRI results -Migraine-pending MRI results  Recommendations:  --MRI Brain  -If MRI is negative will consider treatment for Migraine -If MRI + will proceed with stroke work-up  Laurey Morale, MSN, NP-C [facility] [phone]  Attending neurologist's note to follow I have seen the patient and reviewed the above note.  I suspect that there may be some component of stress associated with his diagnosis of carotid stenosis.  EEG is negative, if MRI is negative, no further work-up at this time.  Roland Rack, MD [facility] [phone]  If 7pm- 7am, please page neurology on call as listed in [place].

## 2017-11-16 NOTE — Procedures (Signed)
History: 72 year old male being evaluated for transient visual change  Sedation: None  Technique: This is a 21 channel routine scalp EEG performed at the bedside with bipolar and monopolar montages arranged in accordance to the international 10/20 system of electrode placement. One channel was dedicated to EKG recording.    Background: The background consists of intermixed alpha and beta activities. There is a well defined posterior dominant rhythm of 9 hz that attenuates with eye opening. Sleep is not recorded.  No epileptiform discharges were seen  Photic stimulation: Physiologic driving is not performed  EEG Abnormalities: None  Clinical Interpretation: This normal EEG is recorded in the waking state. There was no seizure or seizure predisposition recorded on this study. Please note that a normal EEG does not preclude the possibility of epilepsy.   Roland Rack, MD [facility] [phone]  If 7pm- 7am, please page neurology on call as listed in [place].

## 2017-11-19 DIAGNOSIS — I6521 Occlusion and stenosis of right carotid artery: Secondary | ICD-10-CM | POA: Diagnosis not present

## 2017-11-20 DIAGNOSIS — I6529 Occlusion and stenosis of unspecified carotid artery: Secondary | ICD-10-CM | POA: Insufficient documentation

## 2017-11-20 HISTORY — DX: Occlusion and stenosis of unspecified carotid artery: I65.29

## 2017-11-27 DIAGNOSIS — I6521 Occlusion and stenosis of right carotid artery: Secondary | ICD-10-CM | POA: Diagnosis not present

## 2017-11-27 DIAGNOSIS — Z01812 Encounter for preprocedural laboratory examination: Secondary | ICD-10-CM | POA: Diagnosis not present

## 2017-12-04 DIAGNOSIS — I4510 Unspecified right bundle-branch block: Secondary | ICD-10-CM | POA: Diagnosis not present

## 2017-12-04 DIAGNOSIS — Z7902 Long term (current) use of antithrombotics/antiplatelets: Secondary | ICD-10-CM | POA: Diagnosis not present

## 2017-12-04 DIAGNOSIS — Z8249 Family history of ischemic heart disease and other diseases of the circulatory system: Secondary | ICD-10-CM | POA: Diagnosis not present

## 2017-12-04 DIAGNOSIS — Z87891 Personal history of nicotine dependence: Secondary | ICD-10-CM | POA: Diagnosis not present

## 2017-12-04 DIAGNOSIS — I9581 Postprocedural hypotension: Secondary | ICD-10-CM | POA: Diagnosis not present

## 2017-12-04 DIAGNOSIS — Z951 Presence of aortocoronary bypass graft: Secondary | ICD-10-CM | POA: Diagnosis not present

## 2017-12-04 DIAGNOSIS — R9431 Abnormal electrocardiogram [ECG] [EKG]: Secondary | ICD-10-CM | POA: Diagnosis not present

## 2017-12-04 DIAGNOSIS — Z955 Presence of coronary angioplasty implant and graft: Secondary | ICD-10-CM | POA: Diagnosis not present

## 2017-12-04 DIAGNOSIS — Z880 Allergy status to penicillin: Secondary | ICD-10-CM | POA: Diagnosis not present

## 2017-12-04 DIAGNOSIS — I10 Essential (primary) hypertension: Secondary | ICD-10-CM | POA: Diagnosis not present

## 2017-12-04 DIAGNOSIS — E785 Hyperlipidemia, unspecified: Secondary | ICD-10-CM | POA: Diagnosis not present

## 2017-12-04 DIAGNOSIS — Z79899 Other long term (current) drug therapy: Secondary | ICD-10-CM | POA: Diagnosis not present

## 2017-12-04 DIAGNOSIS — I6521 Occlusion and stenosis of right carotid artery: Secondary | ICD-10-CM | POA: Diagnosis not present

## 2017-12-04 DIAGNOSIS — Z7982 Long term (current) use of aspirin: Secondary | ICD-10-CM | POA: Diagnosis not present

## 2017-12-04 DIAGNOSIS — G4733 Obstructive sleep apnea (adult) (pediatric): Secondary | ICD-10-CM | POA: Diagnosis not present

## 2017-12-04 DIAGNOSIS — I2510 Atherosclerotic heart disease of native coronary artery without angina pectoris: Secondary | ICD-10-CM | POA: Diagnosis not present

## 2017-12-06 DIAGNOSIS — I9581 Postprocedural hypotension: Secondary | ICD-10-CM | POA: Insufficient documentation

## 2017-12-30 DIAGNOSIS — L810 Postinflammatory hyperpigmentation: Secondary | ICD-10-CM | POA: Diagnosis not present

## 2017-12-30 DIAGNOSIS — L570 Actinic keratosis: Secondary | ICD-10-CM | POA: Diagnosis not present

## 2018-01-10 DIAGNOSIS — I6521 Occlusion and stenosis of right carotid artery: Secondary | ICD-10-CM | POA: Diagnosis not present

## 2018-02-04 DIAGNOSIS — S76012A Strain of muscle, fascia and tendon of left hip, initial encounter: Secondary | ICD-10-CM | POA: Diagnosis not present

## 2018-02-04 DIAGNOSIS — Z8673 Personal history of transient ischemic attack (TIA), and cerebral infarction without residual deficits: Secondary | ICD-10-CM | POA: Diagnosis not present

## 2018-02-10 DIAGNOSIS — D485 Neoplasm of uncertain behavior of skin: Secondary | ICD-10-CM | POA: Diagnosis not present

## 2018-02-10 DIAGNOSIS — L281 Prurigo nodularis: Secondary | ICD-10-CM | POA: Diagnosis not present

## 2018-02-10 DIAGNOSIS — L570 Actinic keratosis: Secondary | ICD-10-CM | POA: Diagnosis not present

## 2018-02-12 ENCOUNTER — Encounter: Payer: Self-pay | Admitting: Neurology

## 2018-02-13 DIAGNOSIS — M2560 Stiffness of unspecified joint, not elsewhere classified: Secondary | ICD-10-CM | POA: Diagnosis not present

## 2018-02-13 DIAGNOSIS — R262 Difficulty in walking, not elsewhere classified: Secondary | ICD-10-CM | POA: Diagnosis not present

## 2018-02-13 DIAGNOSIS — Z8673 Personal history of transient ischemic attack (TIA), and cerebral infarction without residual deficits: Secondary | ICD-10-CM | POA: Diagnosis not present

## 2018-02-13 DIAGNOSIS — R531 Weakness: Secondary | ICD-10-CM | POA: Diagnosis not present

## 2018-02-17 DIAGNOSIS — Z8673 Personal history of transient ischemic attack (TIA), and cerebral infarction without residual deficits: Secondary | ICD-10-CM | POA: Diagnosis not present

## 2018-02-17 DIAGNOSIS — M2560 Stiffness of unspecified joint, not elsewhere classified: Secondary | ICD-10-CM | POA: Diagnosis not present

## 2018-02-17 DIAGNOSIS — R262 Difficulty in walking, not elsewhere classified: Secondary | ICD-10-CM | POA: Diagnosis not present

## 2018-02-17 DIAGNOSIS — R531 Weakness: Secondary | ICD-10-CM | POA: Diagnosis not present

## 2018-02-19 DIAGNOSIS — R262 Difficulty in walking, not elsewhere classified: Secondary | ICD-10-CM | POA: Diagnosis not present

## 2018-02-19 DIAGNOSIS — R531 Weakness: Secondary | ICD-10-CM | POA: Diagnosis not present

## 2018-02-19 DIAGNOSIS — Z8673 Personal history of transient ischemic attack (TIA), and cerebral infarction without residual deficits: Secondary | ICD-10-CM | POA: Diagnosis not present

## 2018-02-19 DIAGNOSIS — M2560 Stiffness of unspecified joint, not elsewhere classified: Secondary | ICD-10-CM | POA: Diagnosis not present

## 2018-02-21 DIAGNOSIS — R262 Difficulty in walking, not elsewhere classified: Secondary | ICD-10-CM | POA: Diagnosis not present

## 2018-02-21 DIAGNOSIS — M2560 Stiffness of unspecified joint, not elsewhere classified: Secondary | ICD-10-CM | POA: Diagnosis not present

## 2018-02-21 DIAGNOSIS — R531 Weakness: Secondary | ICD-10-CM | POA: Diagnosis not present

## 2018-02-21 DIAGNOSIS — Z8673 Personal history of transient ischemic attack (TIA), and cerebral infarction without residual deficits: Secondary | ICD-10-CM | POA: Diagnosis not present

## 2018-02-24 DIAGNOSIS — R262 Difficulty in walking, not elsewhere classified: Secondary | ICD-10-CM | POA: Diagnosis not present

## 2018-02-24 DIAGNOSIS — R531 Weakness: Secondary | ICD-10-CM | POA: Diagnosis not present

## 2018-02-24 DIAGNOSIS — M2560 Stiffness of unspecified joint, not elsewhere classified: Secondary | ICD-10-CM | POA: Diagnosis not present

## 2018-02-24 DIAGNOSIS — Z8673 Personal history of transient ischemic attack (TIA), and cerebral infarction without residual deficits: Secondary | ICD-10-CM | POA: Diagnosis not present

## 2018-02-26 DIAGNOSIS — R262 Difficulty in walking, not elsewhere classified: Secondary | ICD-10-CM | POA: Diagnosis not present

## 2018-02-26 DIAGNOSIS — Z8673 Personal history of transient ischemic attack (TIA), and cerebral infarction without residual deficits: Secondary | ICD-10-CM | POA: Diagnosis not present

## 2018-02-26 DIAGNOSIS — R531 Weakness: Secondary | ICD-10-CM | POA: Diagnosis not present

## 2018-02-26 DIAGNOSIS — M2560 Stiffness of unspecified joint, not elsewhere classified: Secondary | ICD-10-CM | POA: Diagnosis not present

## 2018-02-28 DIAGNOSIS — R531 Weakness: Secondary | ICD-10-CM | POA: Diagnosis not present

## 2018-02-28 DIAGNOSIS — R262 Difficulty in walking, not elsewhere classified: Secondary | ICD-10-CM | POA: Diagnosis not present

## 2018-02-28 DIAGNOSIS — M2560 Stiffness of unspecified joint, not elsewhere classified: Secondary | ICD-10-CM | POA: Diagnosis not present

## 2018-02-28 DIAGNOSIS — Z8673 Personal history of transient ischemic attack (TIA), and cerebral infarction without residual deficits: Secondary | ICD-10-CM | POA: Diagnosis not present

## 2018-03-03 DIAGNOSIS — Z8673 Personal history of transient ischemic attack (TIA), and cerebral infarction without residual deficits: Secondary | ICD-10-CM | POA: Diagnosis not present

## 2018-03-03 DIAGNOSIS — R531 Weakness: Secondary | ICD-10-CM | POA: Diagnosis not present

## 2018-03-03 DIAGNOSIS — R262 Difficulty in walking, not elsewhere classified: Secondary | ICD-10-CM | POA: Diagnosis not present

## 2018-03-03 DIAGNOSIS — M2560 Stiffness of unspecified joint, not elsewhere classified: Secondary | ICD-10-CM | POA: Diagnosis not present

## 2018-03-05 DIAGNOSIS — R262 Difficulty in walking, not elsewhere classified: Secondary | ICD-10-CM | POA: Diagnosis not present

## 2018-03-05 DIAGNOSIS — M2560 Stiffness of unspecified joint, not elsewhere classified: Secondary | ICD-10-CM | POA: Diagnosis not present

## 2018-03-05 DIAGNOSIS — Z8673 Personal history of transient ischemic attack (TIA), and cerebral infarction without residual deficits: Secondary | ICD-10-CM | POA: Diagnosis not present

## 2018-03-05 DIAGNOSIS — R531 Weakness: Secondary | ICD-10-CM | POA: Diagnosis not present

## 2018-03-07 DIAGNOSIS — R531 Weakness: Secondary | ICD-10-CM | POA: Diagnosis not present

## 2018-03-07 DIAGNOSIS — R262 Difficulty in walking, not elsewhere classified: Secondary | ICD-10-CM | POA: Diagnosis not present

## 2018-03-07 DIAGNOSIS — Z8673 Personal history of transient ischemic attack (TIA), and cerebral infarction without residual deficits: Secondary | ICD-10-CM | POA: Diagnosis not present

## 2018-03-07 DIAGNOSIS — M2560 Stiffness of unspecified joint, not elsewhere classified: Secondary | ICD-10-CM | POA: Diagnosis not present

## 2018-03-10 DIAGNOSIS — Z8673 Personal history of transient ischemic attack (TIA), and cerebral infarction without residual deficits: Secondary | ICD-10-CM | POA: Diagnosis not present

## 2018-03-10 DIAGNOSIS — M2560 Stiffness of unspecified joint, not elsewhere classified: Secondary | ICD-10-CM | POA: Diagnosis not present

## 2018-03-10 DIAGNOSIS — R262 Difficulty in walking, not elsewhere classified: Secondary | ICD-10-CM | POA: Diagnosis not present

## 2018-03-10 DIAGNOSIS — R531 Weakness: Secondary | ICD-10-CM | POA: Diagnosis not present

## 2018-03-12 DIAGNOSIS — R531 Weakness: Secondary | ICD-10-CM | POA: Diagnosis not present

## 2018-03-12 DIAGNOSIS — R262 Difficulty in walking, not elsewhere classified: Secondary | ICD-10-CM | POA: Diagnosis not present

## 2018-03-12 DIAGNOSIS — Z8673 Personal history of transient ischemic attack (TIA), and cerebral infarction without residual deficits: Secondary | ICD-10-CM | POA: Diagnosis not present

## 2018-03-12 DIAGNOSIS — M2560 Stiffness of unspecified joint, not elsewhere classified: Secondary | ICD-10-CM | POA: Diagnosis not present

## 2018-03-13 DIAGNOSIS — R262 Difficulty in walking, not elsewhere classified: Secondary | ICD-10-CM | POA: Diagnosis not present

## 2018-03-13 DIAGNOSIS — M2560 Stiffness of unspecified joint, not elsewhere classified: Secondary | ICD-10-CM | POA: Diagnosis not present

## 2018-03-13 DIAGNOSIS — Z8673 Personal history of transient ischemic attack (TIA), and cerebral infarction without residual deficits: Secondary | ICD-10-CM | POA: Diagnosis not present

## 2018-03-13 DIAGNOSIS — R531 Weakness: Secondary | ICD-10-CM | POA: Diagnosis not present

## 2018-03-17 DIAGNOSIS — M2560 Stiffness of unspecified joint, not elsewhere classified: Secondary | ICD-10-CM | POA: Diagnosis not present

## 2018-03-17 DIAGNOSIS — Z8673 Personal history of transient ischemic attack (TIA), and cerebral infarction without residual deficits: Secondary | ICD-10-CM | POA: Diagnosis not present

## 2018-03-17 DIAGNOSIS — R262 Difficulty in walking, not elsewhere classified: Secondary | ICD-10-CM | POA: Diagnosis not present

## 2018-03-17 DIAGNOSIS — R531 Weakness: Secondary | ICD-10-CM | POA: Diagnosis not present

## 2018-03-18 DIAGNOSIS — K219 Gastro-esophageal reflux disease without esophagitis: Secondary | ICD-10-CM | POA: Diagnosis not present

## 2018-03-18 DIAGNOSIS — M25552 Pain in left hip: Secondary | ICD-10-CM | POA: Diagnosis not present

## 2018-03-18 DIAGNOSIS — Z8673 Personal history of transient ischemic attack (TIA), and cerebral infarction without residual deficits: Secondary | ICD-10-CM | POA: Diagnosis not present

## 2018-03-18 DIAGNOSIS — I10 Essential (primary) hypertension: Secondary | ICD-10-CM | POA: Diagnosis not present

## 2018-03-19 DIAGNOSIS — M2560 Stiffness of unspecified joint, not elsewhere classified: Secondary | ICD-10-CM | POA: Diagnosis not present

## 2018-03-19 DIAGNOSIS — Z8673 Personal history of transient ischemic attack (TIA), and cerebral infarction without residual deficits: Secondary | ICD-10-CM | POA: Diagnosis not present

## 2018-03-19 DIAGNOSIS — R262 Difficulty in walking, not elsewhere classified: Secondary | ICD-10-CM | POA: Diagnosis not present

## 2018-03-19 DIAGNOSIS — R531 Weakness: Secondary | ICD-10-CM | POA: Diagnosis not present

## 2018-03-25 ENCOUNTER — Telehealth: Payer: Self-pay | Admitting: Family Medicine

## 2018-03-25 DIAGNOSIS — R531 Weakness: Secondary | ICD-10-CM | POA: Diagnosis not present

## 2018-03-25 DIAGNOSIS — M2560 Stiffness of unspecified joint, not elsewhere classified: Secondary | ICD-10-CM | POA: Diagnosis not present

## 2018-03-25 DIAGNOSIS — Z8673 Personal history of transient ischemic attack (TIA), and cerebral infarction without residual deficits: Secondary | ICD-10-CM | POA: Diagnosis not present

## 2018-03-25 DIAGNOSIS — R262 Difficulty in walking, not elsewhere classified: Secondary | ICD-10-CM | POA: Diagnosis not present

## 2018-03-25 NOTE — Telephone Encounter (Signed)
Called patient but no answer, did not leave a VM because no updated dpr on file. I was calling to see if he was still our patient and if so, we would like to schedule a CPE for him.

## 2018-04-14 DIAGNOSIS — J0100 Acute maxillary sinusitis, unspecified: Secondary | ICD-10-CM | POA: Diagnosis not present

## 2018-04-14 DIAGNOSIS — J309 Allergic rhinitis, unspecified: Secondary | ICD-10-CM | POA: Diagnosis not present

## 2018-04-14 DIAGNOSIS — I10 Essential (primary) hypertension: Secondary | ICD-10-CM | POA: Diagnosis not present

## 2018-04-14 DIAGNOSIS — K219 Gastro-esophageal reflux disease without esophagitis: Secondary | ICD-10-CM | POA: Diagnosis not present

## 2018-04-14 DIAGNOSIS — R52 Pain, unspecified: Secondary | ICD-10-CM | POA: Diagnosis not present

## 2018-04-16 ENCOUNTER — Ambulatory Visit: Payer: Medicare Other | Admitting: Neurology

## 2018-04-17 ENCOUNTER — Ambulatory Visit: Payer: Medicare Other | Admitting: Neurology

## 2018-04-18 DIAGNOSIS — S80811A Abrasion, right lower leg, initial encounter: Secondary | ICD-10-CM | POA: Diagnosis not present

## 2018-04-18 DIAGNOSIS — M25561 Pain in right knee: Secondary | ICD-10-CM | POA: Diagnosis not present

## 2018-04-18 DIAGNOSIS — S7011XA Contusion of right thigh, initial encounter: Secondary | ICD-10-CM | POA: Diagnosis not present

## 2018-04-23 DIAGNOSIS — I10 Essential (primary) hypertension: Secondary | ICD-10-CM | POA: Diagnosis not present

## 2018-04-23 DIAGNOSIS — I6523 Occlusion and stenosis of bilateral carotid arteries: Secondary | ICD-10-CM | POA: Diagnosis not present

## 2018-04-23 DIAGNOSIS — I6521 Occlusion and stenosis of right carotid artery: Secondary | ICD-10-CM | POA: Diagnosis not present

## 2018-04-28 DIAGNOSIS — I10 Essential (primary) hypertension: Secondary | ICD-10-CM | POA: Diagnosis not present

## 2018-04-28 DIAGNOSIS — I2510 Atherosclerotic heart disease of native coronary artery without angina pectoris: Secondary | ICD-10-CM | POA: Diagnosis not present

## 2018-04-28 DIAGNOSIS — Z8673 Personal history of transient ischemic attack (TIA), and cerebral infarction without residual deficits: Secondary | ICD-10-CM | POA: Diagnosis not present

## 2018-04-28 DIAGNOSIS — K219 Gastro-esophageal reflux disease without esophagitis: Secondary | ICD-10-CM | POA: Diagnosis not present

## 2018-04-28 DIAGNOSIS — E785 Hyperlipidemia, unspecified: Secondary | ICD-10-CM | POA: Diagnosis not present

## 2018-05-08 NOTE — Progress Notes (Signed)
NEUROLOGY CONSULTATION NOTE  Albert Hayes MRN: [id number] DOB: 10-05-1945  Referring provider: Merri Ray, MD Primary care provider: Merri Ray, MD  Reason for consult:  TIA  HISTORY OF PRESENT ILLNESS: Albert Hayes is a 73 year old left-handed man with hypertension, COPD, CAD status post CABG 2005, OSA, anxiety and depression who presents for stroke/TIA.  History supplemented by hospital records.  Albert Hayes was admitted to [facility] from 11/15/2017 to 11/17/2017 after presenting with transient episode of left facial droop, left-sided weakness, slurred speech, and visual disturbance characteristic of a brightly colored pixelated scotoma in his left visual field.  Lasting about 20 minutes.  Note, he does not remember this symptom when I asked him about it.  There was no associated headache.  CT and MRI of the brain were personally reviewed and were negative for acute abnormality.  CTA of the head and neck with perfusion study showed proximal right ICA at least 80% stenosis and proximal left ICA 40% stenosis but no emergent large vessel occlusion or significant stenosis.  LDL was 115.  Hemoglobin A1c was 6.1.  He had an EEG which was normal.  He was evaluated by neurology and was noted to have inconsistencies on his neurologic exam.  He endorsed some memory deficits as well.  Complicated migraine versus conversion disorder was considered.  He denies history of migraines.  He was continued on aspirin and Livalo.  He was referred to vascular surgery as an outpatient for further evaluation of the right ICA stenosis and he underwent right carotid endarterectomy about a month later.  He was subsequently started on Plavix 75mg  daily.  Most recent carotid doppler from 04/23/18 showed no hemodynamically significant stenosis.  He underwent outpatient PT, which was helpful.  He reports some short-term memory deficits since the TIA.  He exercises.    PAST MEDICAL HISTORY: Past Medical  History:  Diagnosis Date  . Anxiety   . Asthma   . BPH (benign prostatic hyperplasia)   . Conduct disorder   . Constipation   . COPD (chronic obstructive pulmonary disease) ([place])   . Coronary artery disease   . Depression   . Diabetes mellitus without complication ([place])   . ED (erectile dysfunction)   . GERD (gastroesophageal reflux disease)   . Heart murmur    EJECTION MURMUR  . History of colon polyps   . HLD (hyperlipidemia)   . Hypertension   . Low back pain   . PTSD (post-traumatic stress disorder)   . Sleep apnea   . Thrombocytopenia ([place])   . Vitamin D deficiency     PAST SURGICAL HISTORY: Past Surgical History:  Procedure Laterality Date  . COLONOSCOPY    . COLONOSCOPY WITH PROPOFOL N/A 10/31/2016   Procedure: COLONOSCOPY WITH PROPOFOL;  Surgeon: Manya Silvas, MD;  Location: [facility] ENDOSCOPY;  Service: Endoscopy;  Laterality: N/A;  . CORONARY ANGIOPLASTY    . CORONARY ARTERY BYPASS GRAFT    . ESOPHAGOGASTRODUODENOSCOPY  2016  . SEPTOPLASTY    . spine/bone surgery    . TONSILLECTOMY      MEDICATIONS: Current Outpatient Medications on File Prior to Visit  Medication Sig Dispense Refill  . amLODipine-benazepril (LOTREL) 5-10 MG capsule Take 1 capsule by mouth daily.  3  . aspirin EC 81 MG tablet Take 81 mg by mouth daily.    Marland Kitchen ibuprofen (ADVIL,MOTRIN) 200 MG tablet Take 200 mg by mouth every 6 (six) hours as needed for headache (pain).    Marland Kitchen  Pitavastatin Calcium (LIVALO) 2 MG TABS Take 2 mg by mouth at bedtime. Reported on 05/25/2015     No current facility-administered medications on file prior to visit.     ALLERGIES: Allergies  Allergen Reactions  . Penicillins Other (See Comments)    Unknown childhood allergic reaction - severe Has patient had a PCN reaction causing immediate rash, facial/tongue/throat swelling, SOB or lightheadedness with hypotension: Unknown Has patient had a PCN reaction causing severe rash involving mucus membranes or skin  necrosis: Unknown Has patient had a PCN reaction that required hospitalization: Yes Has patient had a PCN reaction occurring within the last 10 years: No If all of the above answers are "NO", then may proceed with Cephalosporin use.  . Xanax [Alprazolam] Shortness Of Breath  . Bupropion Other (See Comments)    Unknown reaction  . Citalopram Other (See Comments)    Unknown reaction  . Cymbalta [Duloxetine Hcl] Other (See Comments)    Unknown reaction  . Fluoxetine Other (See Comments)    Unknown reaction  . Fluvoxamine Other (See Comments)    Unknown reaction  . Lexapro [Escitalopram Oxalate] Other (See Comments)    Unknown reaction  . Niacin And Related Other (See Comments)    Unknown reaction  . Oxcarbazepine Other (See Comments)    Unknown reaction  . Pravastatin Other (See Comments)    Unknown reaction  . Seroquel [Quetiapine Fumarate] Other (See Comments)    Unknown reaction  . Statins Other (See Comments)    Unknown adverse reaction  . Tamsulosin Other (See Comments)    Unknown reaction  . Zoloft [Sertraline Hcl] Other (See Comments)    Unknown reaction    FAMILY HISTORY: Family History  Problem Relation Age of Onset  . Headache Mother   . Cancer Father        Lung cancer   SOCIAL HISTORY: Social History   Socioeconomic History  . Marital status: Married    Spouse name: Not on file  . Number of children: Not on file  . Years of education: Not on file  . Highest education level: Not on file  Occupational History  . Occupation: Retired Biochemist, clinical  . Financial resource strain: Not on file  . Food insecurity:    Worry: Not on file    Inability: Not on file  . Transportation needs:    Medical: Not on file    Non-medical: Not on file  Tobacco Use  . Smoking status: Former Smoker    Last attempt to quit: 10/30/1988    Years since quitting: 29.5  . Smokeless tobacco: Never Used  Substance and Sexual Activity  . Alcohol use: No     Alcohol/week: 0.0 standard drinks  . Drug use: No  . Sexual activity: Not on file  Lifestyle  . Physical activity:    Days per week: Not on file    Minutes per session: Not on file  . Stress: Not on file  Relationships  . Social connections:    Talks on phone: Not on file    Gets together: Not on file    Attends religious service: Not on file    Active member of club or organization: Not on file    Attends meetings of clubs or organizations: Not on file    Relationship status: Not on file  . Intimate partner violence:    Fear of current or ex partner: Not on file    Emotionally abused: Not on file  Physically abused: Not on file    Forced sexual activity: Not on file  Other Topics Concern  . Not on file  Social History Narrative   Married. Education: high school/other. Exercise: Yes.    REVIEW OF SYSTEMS: Constitutional: No fevers, chills, or sweats, no generalized fatigue, change in appetite Eyes: No visual changes, double vision, eye pain Ear, nose and throat: No hearing loss, ear pain, nasal congestion, sore throat Cardiovascular: No chest pain, palpitations Respiratory:  No shortness of breath at rest or with exertion, wheezes GastrointestinaI: No nausea, vomiting, diarrhea, abdominal pain, fecal incontinence Genitourinary:  No dysuria, urinary retention or frequency Musculoskeletal:  No neck pain, back pain Integumentary: No rash, pruritus, skin lesions Neurological: as above Psychiatric: No depression, insomnia, anxiety Endocrine: No palpitations, fatigue, diaphoresis, mood swings, change in appetite, change in weight, increased thirst Hematologic/Lymphatic:  No purpura, petechiae. Allergic/Immunologic: no itchy/runny eyes, nasal congestion, recent allergic reactions, rashes  PHYSICAL EXAM: There were no vitals taken for this visit. General: No acute distress.  Patient appears well-groomed.  Head:  Normocephalic/atraumatic Eyes:  fundi examined but not  visualized Neck: supple, no paraspinal tenderness, full range of motion Back: No paraspinal tenderness Heart: regular rate and rhythm Lungs: Clear to auscultation bilaterally. Vascular: No carotid bruits. Neurological Exam: Mental status: alert and oriented to person, place, and time, recent and remote memory intact, fund of knowledge intact, attention and concentration intact, speech fluent and not dysarthric, language intact. Cranial nerves: CN I: not tested CN II: pupils equal, round and reactive to light, visual fields intact CN III, IV, VI:  full range of motion, no nystagmus, no ptosis CN V: facial sensation intact CN VII: upper and lower face symmetric CN VIII: hearing intact CN IX, X: gag intact, uvula midline CN XI: sternocleidomastoid and trapezius muscles intact CN XII: tongue midline Bulk & Tone: normal, no fasciculations. Motor:  Left pronator drift.  Strength 4+/5 left triceps and biceps and 5-/5 left hip flexion.  Otherwise, 5/5 throughout Sensation:  Pinprick and vibration sensation intact. Deep Tendon Reflexes:  2+ throughout, toes downgoing.  Finger to nose testing:  Without dysmetria.  Heel to shin:  Without dysmetria.  Gait:  Normal station and stride.  Able to turn but unable to tandem walk. Romberg negative.  IMPRESSION: 1.  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  The positive visual phenomena is more characteristic of migraine.  However, he has no history of migraine and continues to have some residual left sided deficits. 2.  Hypertension 3.  Hyperlipidemia.  PLAN: 1.  Continue ASA and Plavix for secondary stroke prevention 2.  Continue statin therapy (LDL goal should be less than 70) 3.  Continue blood pressure control 4.  Continue routine exercise 5.  Recommend Mediterranean diet (information provided) 6.  Follow up in 6 months.  Thank you for allowing me to  take part in the care of this patient.  Metta Clines, DO  CC: Albert Ray, MD

## 2018-05-12 ENCOUNTER — Ambulatory Visit: Payer: Medicare Other | Admitting: Neurology

## 2018-05-12 ENCOUNTER — Encounter: Payer: Self-pay | Admitting: Neurology

## 2018-05-12 VITALS — BP 124/84 | HR 89 | Ht 68.0 in | Wt 167.5 lb

## 2018-05-12 DIAGNOSIS — I69354 Hemiplegia and hemiparesis following cerebral infarction affecting left non-dominant side: Secondary | ICD-10-CM | POA: Diagnosis not present

## 2018-05-12 DIAGNOSIS — Z8679 Personal history of other diseases of the circulatory system: Secondary | ICD-10-CM

## 2018-05-12 DIAGNOSIS — I6521 Occlusion and stenosis of right carotid artery: Secondary | ICD-10-CM | POA: Diagnosis not present

## 2018-05-12 DIAGNOSIS — E785 Hyperlipidemia, unspecified: Secondary | ICD-10-CM

## 2018-05-12 DIAGNOSIS — I10 Essential (primary) hypertension: Secondary | ICD-10-CM

## 2018-05-12 NOTE — Patient Instructions (Signed)
1.  Continue aspirin 81mg  and Plavix 75mg  daily 2.  Continue cholesterol and blood pressure medications 3.  Continue routine exercise 4.  Start Mediterranean diet (see below) 5.  Follow up in 6 months      Mediterranean Diet  Why follow it? Research shows. . Those who follow the Mediterranean diet have a reduced risk of heart disease  . The diet is associated with a reduced incidence of Parkinson's and Alzheimer's diseases . People following the diet may have longer life expectancies and lower rates of chronic diseases  . The Dietary Guidelines for Americans recommends the Mediterranean diet as an eating plan to promote health and prevent disease  What Is the Mediterranean Diet?  . Healthy eating plan based on typical foods and recipes of Mediterranean-style cooking . The diet is primarily a plant based diet; these foods should make up a majority of meals   Starches - Plant based foods should make up a majority of meals - They are an important sources of vitamins, minerals, energy, antioxidants, and fiber - Choose whole grains, foods high in fiber and minimally processed items  - Typical grain sources include wheat, oats, barley, corn, brown rice, bulgar, farro, millet, polenta, couscous  - Various types of beans include chickpeas, lentils, fava beans, black beans, white beans   Fruits  Veggies - Large quantities of antioxidant rich fruits & veggies; 6 or more servings  - Vegetables can be eaten raw or lightly drizzled with oil and cooked  - Vegetables common to the traditional Mediterranean Diet include: artichokes, arugula, beets, broccoli, brussel sprouts, cabbage, carrots, celery, collard greens, cucumbers, eggplant, kale, leeks, lemons, lettuce, mushrooms, okra, onions, peas, peppers, potatoes, pumpkin, radishes, rutabaga, shallots, spinach, sweet potatoes, turnips, zucchini - Fruits common to the Mediterranean Diet include: apples, apricots, avocados, cherries, clementines, dates,  figs, grapefruits, grapes, melons, nectarines, oranges, peaches, pears, pomegranates, strawberries, tangerines  Fats - Replace butter and margarine with healthy oils, such as olive oil, canola oil, and tahini  - Limit nuts to no more than a handful a day  - Nuts include walnuts, almonds, pecans, pistachios, pine nuts  - Limit or avoid candied, honey roasted or heavily salted nuts - Olives are central to the [employer] - can be eaten whole or used in a variety of dishes   Meats Protein - Limiting red meat: no more than a few times a month - When eating red meat: choose lean cuts and keep the portion to the size of deck of cards - Eggs: approx. 0 to 4 times a week  - Fish and lean poultry: at least 2 a week  - Healthy protein sources include, chicken, [place], lean beef, lamb - Increase intake of seafood such as tuna, salmon, trout, mackerel, shrimp, scallops - Avoid or limit high fat processed meats such as sausage and bacon  Dairy - Include moderate amounts of low fat dairy products  - Focus on healthy dairy such as fat free yogurt, skim milk, low or reduced fat cheese - Limit dairy products higher in fat such as whole or 2% milk, cheese, ice cream  Alcohol - Moderate amounts of red wine is ok  - No more than 5 oz daily for women (all ages) and men older than age 43  - No more than 10 oz of wine daily for men younger than 66  Other - Limit sweets and other desserts  - Use herbs and spices instead of salt to flavor foods  - Herbs and  spices common to the traditional Mediterranean Diet include: basil, bay leaves, chives, cloves, cumin, fennel, garlic, lavender, marjoram, mint, oregano, parsley, pepper, rosemary, sage, savory, sumac, tarragon, thyme   It's not just a diet, it's a lifestyle:  . The Mediterranean diet includes lifestyle factors typical of those in the region  . Foods, drinks and meals are best eaten with others and savored . Daily physical activity is important for  overall good health . This could be strenuous exercise like running and aerobics . This could also be more leisurely activities such as walking, housework, yard-work, or taking the stairs . Moderation is the key; a balanced and healthy diet accommodates most foods and drinks . Consider portion sizes and frequency of consumption of certain foods   Meal Ideas & Options:  . Breakfast:  o Whole wheat toast or whole wheat English muffins with peanut butter & hard boiled egg o Steel cut oats topped with apples & cinnamon and skim milk  o Fresh fruit: banana, strawberries, melon, berries, peaches  o Smoothies: strawberries, bananas, greek yogurt, peanut butter o Low fat greek yogurt with blueberries and granola  o Egg white omelet with spinach and mushrooms o Breakfast couscous: whole wheat couscous, apricots, skim milk, cranberries  . Sandwiches:  o Hummus and grilled vegetables (peppers, zucchini, squash) on whole wheat bread   o Grilled chicken on whole wheat pita with lettuce, tomatoes, cucumbers or tzatziki  o Tuna salad on whole wheat bread: tuna salad made with greek yogurt, olives, red peppers, capers, green onions o Garlic rosemary lamb pita: lamb sauted with garlic, rosemary, salt & pepper; add lettuce, cucumber, greek yogurt to pita - flavor with lemon juice and black pepper  . Seafood:  o Mediterranean grilled salmon, seasoned with garlic, basil, parsley, lemon juice and black pepper o Shrimp, lemon, and spinach whole-grain pasta salad made with low fat greek yogurt  o Seared scallops with lemon orzo  o Seared tuna steaks seasoned salt, pepper, coriander topped with tomato mixture of olives, tomatoes, olive oil, minced garlic, parsley, green onions and cappers  . Meats:  o Herbed greek chicken salad with kalamata olives, cucumber, feta  o Red bell peppers stuffed with spinach, bulgur, lean ground beef (or lentils) & topped with feta   o Kebabs: skewers of chicken, tomatoes, onions,  zucchini, squash  o [place] burgers: made with red onions, mint, dill, lemon juice, feta cheese topped with roasted red peppers . Vegetarian o Cucumber salad: cucumbers, artichoke hearts, celery, red onion, feta cheese, tossed in olive oil & lemon juice  o Hummus and whole grain pita points with a greek salad (lettuce, tomato, feta, olives, cucumbers, red onion) o Lentil soup with celery, carrots made with vegetable broth, garlic, salt and pepper  o Tabouli salad: parsley, bulgur, mint, scallions, cucumbers, tomato, radishes, lemon juice, olive oil, salt and pepper.

## 2018-05-27 DIAGNOSIS — I10 Essential (primary) hypertension: Secondary | ICD-10-CM | POA: Diagnosis not present

## 2018-05-27 DIAGNOSIS — Z951 Presence of aortocoronary bypass graft: Secondary | ICD-10-CM | POA: Diagnosis not present

## 2018-05-27 DIAGNOSIS — I6521 Occlusion and stenosis of right carotid artery: Secondary | ICD-10-CM | POA: Diagnosis not present

## 2018-05-27 DIAGNOSIS — Z955 Presence of coronary angioplasty implant and graft: Secondary | ICD-10-CM | POA: Diagnosis not present

## 2018-10-24 DIAGNOSIS — I6523 Occlusion and stenosis of bilateral carotid arteries: Secondary | ICD-10-CM | POA: Diagnosis not present

## 2018-10-24 DIAGNOSIS — I6521 Occlusion and stenosis of right carotid artery: Secondary | ICD-10-CM | POA: Diagnosis not present

## 2018-10-24 DIAGNOSIS — I10 Essential (primary) hypertension: Secondary | ICD-10-CM | POA: Diagnosis not present

## 2018-11-09 NOTE — Progress Notes (Signed)
Virtual Visit via Telephone Note The purpose of this virtual visit is to provide medical care while limiting exposure to the novel coronavirus.    Consent was obtained for phone visit:  Yes Answered questions that patient had about telehealth interaction:  Yes I discussed the limitations, risks, security and privacy concerns of performing an evaluation and management service by telephone. I also discussed with the patient that there may be a patient responsible charge related to this service. The patient expressed understanding and agreed to proceed.  Pt location: Home Physician Location: Home Name of referring provider:  Wendie Agreste, MD I connected with .Albert Hayes at patients initiation/request on 11/10/2018 at 10:30 AM EDT by telephone and verified that I am speaking with the correct person using two identifiers.  Pt MRN:  [id number] Pt DOB:  Nov 26, 1945   History of Present Illness:  Albert Hayes is a 73 year old left-handed man with hypertension, COPD, CAD status post CABG 2005, OSA, anxiety and depression who follows up for right carotid artery syndrome.  UPDATE: Current medications:  ASA 81mg ; Plavix 75mg ; Livalo 2mg   Sometimes he may still have problems with speech and may take a moment to recall words.  Otherwise, no residual symptoms.  Recent repeat carotid doppler from 10/24/18 was stable.  HISTORY: Mr. Chura was admitted to [facility] from 11/15/2017 to 11/17/2017 after presenting with transient episode of left facial droop, left-sided weakness, slurred speech, and visual disturbance characteristic of a brightly colored pixelated scotoma in his left visual field.  Lasting about 20 minutes.  Note, he does not remember this symptom when I asked him about it.  There was no associated headache.  CT and MRI of the brain were personally reviewed and were negative for acute abnormality.  CTA of the head and neck with perfusion study showed proximal right ICA at  least 80% stenosis and proximal left ICA 40% stenosis but no emergent large vessel occlusion or significant stenosis.  LDL was 115.  Hemoglobin A1c was 6.1.  He had an EEG which was normal.  He was evaluated by neurology and was noted to have inconsistencies on his neurologic exam.  He endorsed some memory deficits as well.  Complicated migraine versus conversion disorder was considered.  He denies history of migraines.  He was continued on aspirin and Livalo.  He was referred to vascular surgery as an outpatient for further evaluation of the right ICA stenosis and he underwent right carotid endarterectomy about a month later.  He was subsequently started on Plavix 75mg  daily.  Most recent carotid doppler from 04/23/18 showed no hemodynamically significant stenosis.  He underwent outpatient PT, which was helpful.  He reports some short-term memory deficits since the TIA.  He exercises.      Observations/Objective:   Temperature (!) 97.3 F (36.3 C), height 5\' 7"  (1.702 m), weight 162 lb (73.5 kg). No acute distress.  Alert and oriented.  Speech fluent and not dysarthric.  Language intact.     Assessment and Plan:   1.  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  The positive visual phenomena is more characteristic of migraine.  However, he has no history of migraine and continues to have some residual left sided deficits. 2.  Hypertension 3.  Hyperlipidemia   1.  Continue ASA and Plavix for secondary stroke prevention 2.  Continue statin therapy (LDL goal should be less than 70) 3.  Continue blood pressure control  4.  Continue routine exercise 5.  Recommend Mediterranean diet (information provided) 6.  Follow up in one year.   Follow Up Instructions:    -I discussed the assessment and treatment plan with the patient. The patient was provided an opportunity to ask questions and all were answered. The patient agreed with the plan and demonstrated an  understanding of the instructions.   The patient was advised to call back or seek an in-person evaluation if the symptoms worsen or if the condition fails to improve as anticipated.    Total Time spent in visit with the patient was:  12 minutes.   Dudley Major, DO

## 2018-11-10 ENCOUNTER — Telehealth (INDEPENDENT_AMBULATORY_CARE_PROVIDER_SITE_OTHER): Payer: Medicare Other | Admitting: Neurology

## 2018-11-10 ENCOUNTER — Encounter: Payer: Self-pay | Admitting: Neurology

## 2018-11-10 ENCOUNTER — Other Ambulatory Visit: Payer: Self-pay

## 2018-11-10 VITALS — Temp 97.3°F | Ht 67.0 in | Wt 162.0 lb

## 2018-11-10 DIAGNOSIS — Z8679 Personal history of other diseases of the circulatory system: Secondary | ICD-10-CM | POA: Diagnosis not present

## 2018-11-10 DIAGNOSIS — I6521 Occlusion and stenosis of right carotid artery: Secondary | ICD-10-CM

## 2018-11-10 DIAGNOSIS — E785 Hyperlipidemia, unspecified: Secondary | ICD-10-CM

## 2018-11-10 DIAGNOSIS — I10 Essential (primary) hypertension: Secondary | ICD-10-CM

## 2019-01-30 ENCOUNTER — Other Ambulatory Visit: Payer: Self-pay

## 2019-01-30 DIAGNOSIS — Z20822 Contact with and (suspected) exposure to covid-19: Secondary | ICD-10-CM

## 2019-01-31 LAB — NOVEL CORONAVIRUS, NAA: SARS-CoV-2, NAA: NOT DETECTED

## 2019-03-17 ENCOUNTER — Telehealth: Payer: Self-pay | Admitting: Neurology

## 2019-03-17 NOTE — Telephone Encounter (Signed)
Patient called regarding having increased Confusion lately. Please Call. Thank you

## 2019-03-17 NOTE — Telephone Encounter (Signed)
Pt called with c/o:  confusion New medications?  No. When did they start?  N/A If hallucinations are new, has patient been checked for infection, including UTI?  No. Current medications prescribed by Dr. Tomi Likens and TIMES taking the medications: not on any current medication by Dr. Tomi Likens   Confusion for several months getting worst He notice confusion when doing a task or when someone is asking him questions.  Pt requesting appt to discuss further. Sent to front desk to schedule

## 2019-03-19 ENCOUNTER — Telehealth (INDEPENDENT_AMBULATORY_CARE_PROVIDER_SITE_OTHER): Payer: Medicare Other | Admitting: Neurology

## 2019-03-19 ENCOUNTER — Other Ambulatory Visit: Payer: Self-pay

## 2019-03-19 ENCOUNTER — Encounter: Payer: Self-pay | Admitting: Neurology

## 2019-03-19 VITALS — Ht 68.0 in | Wt 164.0 lb

## 2019-03-19 DIAGNOSIS — I10 Essential (primary) hypertension: Secondary | ICD-10-CM

## 2019-03-19 DIAGNOSIS — E785 Hyperlipidemia, unspecified: Secondary | ICD-10-CM | POA: Diagnosis not present

## 2019-03-19 DIAGNOSIS — R413 Other amnesia: Secondary | ICD-10-CM

## 2019-03-19 DIAGNOSIS — R4189 Other symptoms and signs involving cognitive functions and awareness: Secondary | ICD-10-CM

## 2019-03-19 DIAGNOSIS — R410 Disorientation, unspecified: Secondary | ICD-10-CM

## 2019-03-19 NOTE — Progress Notes (Signed)
Virtual Visit via Video Note The purpose of this virtual visit is to provide medical care while limiting exposure to the novel coronavirus.    Consent was obtained for video visit:  Yes Answered questions that patient had about telehealth interaction:  Yes I discussed the limitations, risks, security and privacy concerns of performing an evaluation and management service by telemedicine. I also discussed with the patient that there may be a patient responsible charge related to this service. The patient expressed understanding and agreed to proceed.  Pt location: Home Physician Location: office Name of referring provider:  No ref. provider found I connected with Albert Hayes at patients initiation/request on 03/19/2019 at  9:10 AM EST by video enabled telemedicine application and verified that I am speaking with the correct person using two identifiers. Pt MRN:  [id number] Pt DOB:  [birth year] Video Participants:  Albert Hayes   History of Present Illness:  Albert Hayes is a 88 year oldleft-handed manwith hypertension, COPD, CAD status post CABG 2005, OSA, anxiety, depression and right carotid artery syndrome.  UPDATE: Current medications:  ASA 81mg ; Altace 2.5mg ; Livalo 2mg   He reports worsening confusion since the stroke.  Over the past 2 months, these episodes have been more frequent.  He has trouble explaining himself.  When he talks, he sometimes jumbles his sentences.  He sometimes has jumbled thoughts.  He may be delayed to respond.  He needs to be more diligent to use a calendar and keep notes to remember things (such as going to the grocery door.  Denies trouble with driving.  His wife has always handled the finances.  He is a Scientist, clinical (histocompatibility and immunogenetics) and sometimes he has trouble performing related tasks.  These episodes of confusion occur 3 or 4 days. He may feel clear for 2 or 3 weeks before he feels confused again.    Grandmother:  Parkinson's disease Grandfather:   Alzheimer's dementia.   HISTORY: Albert Hayes was admitted to [facility] from 11/15/2017 to 8/11/2019after presenting with transientepisode of left facial droop, left-sided weakness, slurred speech, and visual disturbance characteristic of a brightly colored pixelated scotoma in his left visual field. Lasting about 20 minutes. Note, he does not remember this symptom when I asked him about it.There was no associated headache.CT and MRI of the brain were personally reviewed and were negative for acute abnormality. CTA of the head and neck with perfusion study showed proximal right ICA at least 80% stenosis and proximal left ICA 40% stenosis but no emergent large vessel occlusion or significant stenosis. LDL was 115. Hemoglobin A1c was 6.1. He had an EEG which was normal. He was evaluated by neurology and was noted to have inconsistencies on his neurologic exam. He endorsed some memory deficits as well. Complicated migraine versus conversion disorder was considered.He denies history of migraines.He was continued on aspirin and Livalo.He was referred to vascular surgery as an outpatient for further evaluation of the right ICA stenosisand he underwent right carotid endarterectomy about a month later. He was subsequently started on Plavix 75mg  daily. Most recent carotid doppler from 04/23/18 showed no hemodynamically significant stenosis. He underwent outpatient PT, which was helpful.  Repeat carotid doppler from 10/24/18 was stable.  Past Medical History: Past Medical History:  Diagnosis Date  . Anxiety   . Asthma   . BPH (benign prostatic hyperplasia)   . Conduct disorder   . Constipation   . COPD (chronic obstructive pulmonary disease) ([place])   . Coronary artery disease   .  Depression   . Diabetes mellitus without complication ([place])   . ED (erectile dysfunction)   . GERD (gastroesophageal reflux disease)   . Heart murmur    EJECTION MURMUR  . History of colon polyps    . HLD (hyperlipidemia)   . Hypertension   . Low back pain   . PTSD (post-traumatic stress disorder)   . Sleep apnea   . Thrombocytopenia ([place])   . Vitamin D deficiency     Medications: Outpatient Encounter Medications as of 03/19/2019  Medication Sig  . aspirin EC 81 MG tablet Take 81 mg by mouth daily.  . clopidogrel (PLAVIX) 75 MG tablet clopidogrel 75 mg tablet  . Pitavastatin Calcium (LIVALO) 2 MG TABS Take 2 mg by mouth at bedtime. Reported on 05/25/2015  . ramipril (ALTACE) 2.5 MG capsule Take by mouth.   No facility-administered encounter medications on file as of 03/19/2019.    Allergies: Allergies  Allergen Reactions  . Penicillins Other (See Comments)    Unknown childhood allergic reaction - severe Has patient had a PCN reaction causing immediate rash, facial/tongue/throat swelling, SOB or lightheadedness with hypotension: Unknown Has patient had a PCN reaction causing severe rash involving mucus membranes or skin necrosis: Unknown Has patient had a PCN reaction that required hospitalization: Yes Has patient had a PCN reaction occurring within the last 10 years: No If all of the above answers are "NO", then may proceed with Cephalosporin use.  . Xanax [Alprazolam] Shortness Of Breath  . Atorvastatin Other (See Comments)    Difficulty breathing   . Bupropion Other (See Comments)    Unknown reaction  . Citalopram Other (See Comments)    Unknown reaction  . Cymbalta [Duloxetine Hcl] Other (See Comments)    Unknown reaction  . Duloxetine Other (See Comments)  . Fluoxetine Other (See Comments)    Unknown reaction  . Fluvoxamine Other (See Comments)    Unknown reaction  . Lexapro [Escitalopram Oxalate] Other (See Comments)    Unknown reaction  . Niacin And Related Other (See Comments)    Unknown reaction  . Oxcarbazepine Other (See Comments)    Unknown reaction  . Pravastatin Other (See Comments)    Unknown reaction  . Quetiapine Other (See Comments)  .  Seroquel [Quetiapine Fumarate] Other (See Comments)    Unknown reaction  . Statins Other (See Comments)    Unknown adverse reaction  . Tamsulosin Other (See Comments)    Unknown reaction  . Zoloft [Sertraline Hcl] Other (See Comments)    Unknown reaction    Family History: Family History  Problem Relation Age of Onset  . Headache Mother   . Cancer Father        Lung cancer    Social History: Social History   Socioeconomic History  . Marital status: Married    Spouse name: Vaughan Basta  . Number of children: 0  . Years of education: Not on file  . Highest education level: Not on file  Occupational History  . Occupation: Retired Scientist, clinical (histocompatibility and immunogenetics)  Tobacco Use  . Smoking status: Former Smoker    Quit date: 10/30/1988    Years since quitting: 30.4  . Smokeless tobacco: Never Used  Substance and Sexual Activity  . Alcohol use: No    Alcohol/week: 0.0 standard drinks  . Drug use: No  . Sexual activity: Not on file  Other Topics Concern  . Not on file  Social History Narrative   Married. Education: high school/other. Exercise: Yes.  Patient is right-handed.   Social Determinants of Health   Financial Resource Strain:   . Difficulty of Paying Living Expenses: Not on file  Food Insecurity:   . Worried About Charity fundraiser in the Last Year: Not on file  . Ran Out of Food in the Last Year: Not on file  Transportation Needs:   . Lack of Transportation (Medical): Not on file  . Lack of Transportation (Non-Medical): Not on file  Physical Activity:   . Days of Exercise per Week: Not on file  . Minutes of Exercise per Session: Not on file  Stress:   . Feeling of Stress : Not on file  Social Connections:   . Frequency of Communication with Friends and Family: Not on file  . Frequency of Social Gatherings with Friends and Family: Not on file  . Attends Religious Services: Not on file  . Active Member of Clubs or Organizations: Not on file  . Attends Theatre manager Meetings: Not on file  . Marital Status: Not on file  Intimate Partner Violence:   . Fear of Current or Ex-Partner: Not on file  . Emotionally Abused: Not on file  . Physically Abused: Not on file  . Sexually Abused: Not on file    Observations/Objective:   Height 5\' 8"  (1.727 m), weight 164 lb (74.4 kg). No acute distress.  Alert and oriented.  Speech fluent and not dysarthric.  Language intact.    Assessment and Plan:   1.  Confusion, memory deficits.   2.  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  The positive visual phenomena is more characteristic of migraine. However, he has no history of migraine and continues to have some residual left sided deficits. 2. Hypertension 3.  Hyperlipidemia  1.  Check B12, TSH 2.  Will order neuropsychological testing 3.  MRI of brain 4.  EEG 5. Follow up after testing.  Follow Up Instructions:    -I discussed the assessment and treatment plan with the patient. The patient was provided an opportunity to ask questions and all were answered. The patient agreed with the plan and demonstrated an understanding of the instructions.   The patient was advised to call back or seek an in-person evaluation if the symptoms worsen or if the condition fails to improve as anticipated.    Total Time spent in visit with the patient was:  18 minutes  Dudley Major, DO

## 2019-03-20 ENCOUNTER — Other Ambulatory Visit: Payer: Self-pay

## 2019-03-20 ENCOUNTER — Other Ambulatory Visit: Payer: Medicare Other

## 2019-03-20 DIAGNOSIS — R4189 Other symptoms and signs involving cognitive functions and awareness: Secondary | ICD-10-CM

## 2019-03-20 DIAGNOSIS — R410 Disorientation, unspecified: Secondary | ICD-10-CM

## 2019-03-20 LAB — TSH: TSH: 3.2 u[IU]/mL (ref 0.35–4.50)

## 2019-03-20 LAB — VITAMIN B12: Vitamin B-12: 1191 pg/mL — ABNORMAL HIGH (ref 211–911)

## 2019-03-30 ENCOUNTER — Other Ambulatory Visit: Payer: Medicare Other

## 2019-04-07 ENCOUNTER — Other Ambulatory Visit: Payer: Self-pay

## 2019-04-07 ENCOUNTER — Ambulatory Visit: Payer: Medicare Other | Admitting: Podiatry

## 2019-04-07 ENCOUNTER — Encounter: Payer: Self-pay | Admitting: Podiatry

## 2019-04-07 DIAGNOSIS — B351 Tinea unguium: Secondary | ICD-10-CM | POA: Insufficient documentation

## 2019-04-07 DIAGNOSIS — M79675 Pain in left toe(s): Secondary | ICD-10-CM

## 2019-04-07 DIAGNOSIS — M79674 Pain in right toe(s): Secondary | ICD-10-CM

## 2019-04-07 NOTE — Progress Notes (Signed)
Complaint:  Visit Type: Patient presents  to my office for preventative foot care services. Complaint: Patient states" my nails have grown long and thick .  He says his wife has been doing his nails but presents to the office today for nail care.   The patient presents for preventative foot care services.  Podiatric Exam: Vascular: dorsalis pedis and posterior tibial pulses are palpablpre bilateral. Capillary return is immediate. Temperature gradient is WNL. Skin turgor WNL  Sensorium: Normal Semmes Weinstein monofilament test. Normal tactile sensation bilaterally. Nail Exam: Pt has thick disfigured discolored nails with subungual debris noted hallux nails  B/L. Ulcer Exam: There is no evidence of ulcer or pre-ulcerative changes or infection. Orthopedic Exam: Muscle tone and strength are WNL. No limitations in general ROM. No crepitus or effusions noted. 1st MCJ  DJD.  Hallux limitus 1st MPJ  B/L. Skin: No Porokeratosis. No infection or ulcers  Diagnosis:  Onychomycosis, , Pain in right toe, pain in left toes  Treatment & Plan IE.    Debridement of mycotic and hypertrophic toenails, 1 through 5 bilateral and clearing of subungual debris. No ulceration, no infection noted.  Return Visit-Office Procedure: Patient instructed to return to the office for a follow up visit 4 months for continued evaluation and treatment.    Gardiner Barefoot DPM

## 2019-04-09 ENCOUNTER — Inpatient Hospital Stay: Admission: RE | Admit: 2019-04-09 | Payer: Medicare Other | Source: Ambulatory Visit

## 2019-04-10 HISTORY — PX: ANOMALOUS PULMONARY VENOUS RETURN REPAIR, TOTAL: SHX1156

## 2019-04-13 ENCOUNTER — Other Ambulatory Visit: Payer: Self-pay

## 2019-04-13 ENCOUNTER — Ambulatory Visit (INDEPENDENT_AMBULATORY_CARE_PROVIDER_SITE_OTHER): Payer: Medicare Other | Admitting: Neurology

## 2019-04-13 DIAGNOSIS — R410 Disorientation, unspecified: Secondary | ICD-10-CM

## 2019-04-13 DIAGNOSIS — R4189 Other symptoms and signs involving cognitive functions and awareness: Secondary | ICD-10-CM

## 2019-04-13 NOTE — Procedures (Signed)
ELECTROENCEPHALOGRAM REPORT  Date of Study: 04/13/2019  Patient's Name: Albert Hayes MRN: [id number] Date of Birth: [birth year]  Referring Provider: Metta Clines, DO  Clinical History: 74 year old male with episodic confusion following CVA.  Medications: aspirin EC 81 MG tablet PLAVIX 75 MG tablet LIVALO 2 MG TABS ALTACE 2.5 MG   Technical Summary: A multichannel digital EEG recording measured by the international 10-20 system with electrodes applied with paste and impedances below 5000 ohms performed in our laboratory with EKG monitoring in an awake and drowsy patient.  Hyperventilation not performed as patient wearing mask for Covid.  Photic stimulation was performed.  The digital EEG was referentially recorded, reformatted, and digitally filtered in a variety of bipolar and referential montages for optimal display.    Description: The patient is awake and drowsy during the recording.  During maximal wakefulness, there is a symmetric, medium voltage 9-10 Hz posterior dominant rhythm that attenuates with eye opening.  The record is symmetric.  Stage 2 sleep not seen.  Photic stimulation did not elicit any abnormalities.  There were no epileptiform discharges or electrographic seizures seen.    EKG lead was unremarkable.  Impression: This awake and drowsy EEG is normal.    Clinical Correlation: A normal EEG does not exclude a clinical diagnosis of epilepsy.  If further clinical questions remain, prolonged EEG may be helpful.  Clinical correlation is advised.   Metta Clines, DO

## 2019-04-19 ENCOUNTER — Other Ambulatory Visit: Payer: Medicare Other

## 2019-04-24 ENCOUNTER — Other Ambulatory Visit: Payer: Self-pay

## 2019-04-24 ENCOUNTER — Ambulatory Visit
Admission: RE | Admit: 2019-04-24 | Discharge: 2019-04-24 | Disposition: A | Discharge status: Home / Self Care | Payer: Medicare Other | Source: Ambulatory Visit | Attending: Neurology | Admitting: Neurology

## 2019-04-24 DIAGNOSIS — R413 Other amnesia: Secondary | ICD-10-CM

## 2019-04-24 DIAGNOSIS — R410 Disorientation, unspecified: Secondary | ICD-10-CM

## 2019-04-24 DIAGNOSIS — R4189 Other symptoms and signs involving cognitive functions and awareness: Secondary | ICD-10-CM

## 2019-04-27 ENCOUNTER — Ambulatory Visit: Payer: Medicare Other | Admitting: Psychology

## 2019-04-27 ENCOUNTER — Encounter: Payer: Self-pay | Admitting: Psychology

## 2019-04-27 ENCOUNTER — Other Ambulatory Visit: Payer: Self-pay

## 2019-04-27 ENCOUNTER — Ambulatory Visit (INDEPENDENT_AMBULATORY_CARE_PROVIDER_SITE_OTHER): Payer: Medicare Other | Admitting: Psychology

## 2019-04-27 DIAGNOSIS — G3184 Mild cognitive impairment, so stated: Secondary | ICD-10-CM

## 2019-04-27 DIAGNOSIS — R413 Other amnesia: Secondary | ICD-10-CM

## 2019-04-27 NOTE — Progress Notes (Signed)
   Psychometrician Note   Albert Hayes completed 130 minutes of neuropsychological testing with technician, Milana Kidney, B.S., under the supervision of Dr. Christia Reading, Ph.D., licensed psychologist. The patient did not appear overtly distressed by the testing session, per behavioral observation or via self-report to the technician. Rest breaks were offered.    In considering the patient's current level of functioning, level of presumed impairment, nature of symptoms, emotional and behavioral responses during the interview, level of literacy, and observed level of motivation/effort, a battery of tests was selected and communicated to the psychometrician.   Communication between the psychologist and technician was ongoing throughout the testing session and changes were made as deemed necessary based on patient performance on testing, technician observations and additional pertinent factors such as those listed above.   Albert Hayes will return within approximately two weeks for an interactive feedback session with Dr. Melvyn Novas at which time his test performances, clinical impressions, and treatment recommendations will be reviewed in detail. The patient understands he can contact our office should he require our assistance before this time.  130 minutes were spent face-to-face with patient administering standardized tests, 30 minutes were spent scoring by the technician, and another 15 minutes were spent reviewing scored testing protocols by Dr. Melvyn Novas. [CPT [id number], [id number]  This note reflects time spent with the psychometrician and does not include test scores or any clinical interpretations made by Dr. Melvyn Novas. The full report will follow in a separate note.

## 2019-04-27 NOTE — Progress Notes (Addendum)
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 interpretation and report writing billed as one unit [phone] and three units [id number].

## 2019-04-28 ENCOUNTER — Telehealth: Payer: Self-pay

## 2019-04-28 ENCOUNTER — Encounter: Payer: Self-pay | Admitting: Psychology

## 2019-04-28 DIAGNOSIS — G3184 Mild cognitive impairment, so stated: Secondary | ICD-10-CM

## 2019-04-28 HISTORY — DX: Mild cognitive impairment of uncertain or unknown etiology: G31.84

## 2019-04-28 NOTE — Telephone Encounter (Signed)
Pt was called and given his MRI results

## 2019-05-06 ENCOUNTER — Ambulatory Visit (INDEPENDENT_AMBULATORY_CARE_PROVIDER_SITE_OTHER): Payer: Medicare Other | Admitting: Psychology

## 2019-05-06 ENCOUNTER — Other Ambulatory Visit: Payer: Self-pay

## 2019-05-06 ENCOUNTER — Encounter: Payer: Self-pay | Admitting: Psychology

## 2019-05-06 DIAGNOSIS — Z712 Person consulting for explanation of examination or test findings: Secondary | ICD-10-CM | POA: Diagnosis not present

## 2019-05-06 DIAGNOSIS — G3184 Mild cognitive impairment, so stated: Secondary | ICD-10-CM | POA: Diagnosis not present

## 2019-05-06 NOTE — Progress Notes (Signed)
   Neuropsychology Feedback Session Albert Hayes. [place] Department of Neurology  Reason for Referral:   Albert Hayes a 74 y.o. Caucasian male referred by Metta Clines, D.O.,to characterize hiscurrent cognitive functioning and assist with diagnostic clarity and treatment planning in the context of subjective cognitive decline, several cardiovascular ailments, and a family history of dementia.  Feedback:   Albert Hayes completed a comprehensive neuropsychological evaluation on 04/27/2019. Please refer to that encounter for the full report and recommendations. Briefly, results suggested an isolated impairment across a story learning verbal memory test. Additional weaknesses were exhibited across attention/concentration, response inhibition, and verbal fluency. There are several potential etiologies which should be considered. The most likely would be a primary vascular etiology, given his history of prior stroke/TIA, additional cardiovascular ailments, and neuroimaging suggesting small vessel disease. There could also be the potential for ongoing and previously undetected seizure activity given his report of confusion and receptive and expressive language deficits which exhibit a "come and go" pattern. Performance difficulties surrounding attention/concentration, executive functioning, and verbal fluency are consistent with what can be seen across both these presentations. Additionally, frontotemporal dementia (FTD) should remain on the differential given word finding difficulties in conversation, verbal fluency deficits across testing, and neuroimaging suggesting frontal and temporal lobe atrophy. However, at the present time, testing scores appear more consistent with his history of vascular illness and/or seizure activity rather than FTD. Results do not suggest Alzheimer's disease at the present time.  Albert Hayes was unaccompanied during the current telephone call. Content of  the current session focused on the results of his neuropsychological evaluation. Albert Hayes was given the opportunity to ask questions and his questions were answered. He was also encouraged to reach out should additional questions arise. A copy of his report was mailed at the conclusion of the visit.      25 minutes were spent conducting the current feedback session with Albert Hayes, billed as one unit [phone]

## 2019-05-19 ENCOUNTER — Encounter: Payer: Self-pay | Admitting: Neurology

## 2019-05-19 NOTE — Progress Notes (Signed)
Virtual Visit via Video Note The purpose of this virtual visit is to provide medical care while limiting exposure to the novel coronavirus.    Consent was obtained for video visit:  Yes.   Answered questions that patient had about telehealth interaction:  Yes.   I discussed the limitations, risks, security and privacy concerns of performing an evaluation and management service by telemedicine. I also discussed with the patient that there may be a patient responsible charge related to this service. The patient expressed understanding and agreed to proceed.  Pt location: Home Physician Location: office Name of referring provider:  No ref. provider found I connected with Lavell Luster at patients initiation/request on 05/20/2019 at 11:10 AM EST by video enabled telemedicine application and verified that I am speaking with the correct person using two identifiers. Pt MRN:  [id number] Pt DOB:  19-Jul-1945 Video Participants:  Lavell Luster   History of Present Illness:  Albert Hayes is a 6 year oldleft-handed manwith hypertension, COPD, CAD status post CABG 2005, OSA, anxiety, depression and right carotid artery syndrome who follows up for confusion.  MRI brain personally reviewed.  UPDATE: Current medications: ASA 81mg ; Altace 2.5mg ; Livalo 2mg   He underwent workup for episodic confusion: 03/20/2019 LABS:  B12 1,191; TSH 3.20 04/13/2019 EEG:  Normal awake and drowsy study. 04/24/2019 MRI BRAIN WO:  Chronic small vessel ischemic changes in pons and minimal in the cerebral white matter.  Brain atrophy with some frontal and temporal predominance.  No acute intracranial abnormality or significant change since prior MRI from 2019. 04/27/2019 NEUROPSYCHOLOGICAL EVALUATION:  Mild neurocognitive disorder.  He exhibited deficits in attention/concentration, executive functioning and verbal fluency.  Multiple etiologies are possible.  Vascular etiology most likely but also considered were  undetected seizure activity or frontotemporal dementia.  About 6 weeks ago, he had another episode of confusion and gibberish speech less than 10 minutes.  No headache.  He reports continued short term memory deficits.  For example, if he watches a sermon on TV, he will forget almost immediately what was said.  The next day, he may forget that he even watched a sermon.    HISTORY: Mr. Albert Hayes was admitted to [facility] from 11/15/2017 to 8/11/2019after presenting with transientepisode of left facial droop, left-sided weakness, slurred speech, and visual disturbance characteristic of a brightly colored pixelated scotoma in his left visual field. Lasting about 20 minutes. Note, he does not remember this symptom when I asked him about it.There was no associated headache.CT and MRI of the brain were personally reviewed and were negative for acute abnormality. CTA of the head and neck with perfusion study showed proximal right ICA at least 80% stenosis and proximal left ICA 40% stenosis but no emergent large vessel occlusion or significant stenosis. LDL was 115. Hemoglobin A1c was 6.1. He had an EEG which was normal. He was evaluated by neurology and was noted to have inconsistencies on his neurologic exam. He endorsed some memory deficits as well. Complicated migraine versus conversion disorder was considered.He denies history of migraines.He was continued on aspirin and Livalo.He was referred to vascular surgery as an outpatient for further evaluation of the right ICA stenosisand he underwent right carotid endarterectomy about a month later. He was subsequently started on Plavix 75mg  daily. Most recent carotid doppler from 04/23/18 showed no hemodynamically significant stenosis. He underwent outpatient PT, which was helpful. Repeat carotid doppler from 10/24/18 was stable.  He reports worsening confusion since the stroke.  Over the  past 2 months, these episodes have been more  frequent.  He has trouble explaining himself.  When he talks, he sometimes jumbles his sentences.  He sometimes has jumbled thoughts.  He may be delayed to respond.  He needs to be more diligent to use a calendar and keep notes to remember things (such as going to the grocery door.  Denies trouble with driving.  His wife has always handled the finances.  He is a Scientist, clinical (histocompatibility and immunogenetics) and sometimes he has trouble performing related tasks.  These episodes of confusion occur 3 or 4 days. He may feel clear for 2 or 3 weeks before he feels confused again.    Mother: migraines Grandmother:  Parkinson's disease Grandfather:  Alzheimer's dementia.   Past Medical History: Past Medical History:  Diagnosis Date  . Arteriosclerosis of coronary artery 12/22/2014  . Asthma   . Benign prostatic hyperplasia without lower urinary tract symptoms 09/13/2012  . Cervical spondylosis 02/27/2013  . Chronic idiopathic constipation 11/02/2015  . Chronic obstructive pulmonary disease 12/22/2014  . Coronary artery disease   . DDD (degenerative disc disease), cervical 02/27/2013  . DDD (degenerative disc disease), lumbosacral 02/27/2013  . Diabetes mellitus without complication   . Dysphagia 10/18/2016  . ED (erectile dysfunction)   . Generalized anxiety disorder 01/21/2013  . GERD (gastroesophageal reflux disease)   . Heart murmur    EJECTION MURMUR  . HLD (hyperlipidemia)   . Hx of colon polyps   . Hx of coronary artery bypass surgery 12/22/2014  . Hypertension   . Low back pain   . Lumbosacral spondylosis 02/27/2013  . Mild neurocognitive disorder 04/28/2019  . Post-traumatic stress disorder, unspecified 02/27/2013  . Sleep apnea   . Status post coronary artery stent placement 10/30/2017   SVG to OM and L main.  . Stenosis of carotid artery 11/20/2017   Added automatically from request for surgery [id number]  . 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 Thrombocytopenia ([place])   . Vitamin D deficiency      Medications: Outpatient Encounter Medications as of 05/20/2019  Medication Sig  . aspirin EC 81 MG tablet Take 81 mg by mouth daily.  . Pitavastatin Calcium (LIVALO) 2 MG TABS Take 2 mg by mouth at bedtime. Reported on 05/25/2015  . ramipril (ALTACE) 2.5 MG capsule Take by mouth.   No facility-administered encounter medications on file as of 05/20/2019.    Allergies: Allergies  Allergen Reactions  . Penicillins Other (See Comments)    Unknown childhood allergic reaction - severe Has patient had a PCN reaction causing immediate rash, facial/tongue/throat swelling, SOB or lightheadedness with hypotension: Unknown Has patient had a PCN reaction causing severe rash involving mucus membranes or skin necrosis: Unknown Has patient had a PCN reaction that required hospitalization: Yes Has patient had a PCN reaction occurring within the last 10 years: No If all of the above answers are "NO", then may proceed with Cephalosporin use.  . Xanax [Alprazolam] Shortness Of Breath  . Atorvastatin Other (See Comments)    Difficulty breathing   . Bupropion Other (See Comments)    Unknown reaction  . Citalopram Other (See Comments)    Unknown reaction  . Cymbalta [Duloxetine Hcl] Other (See Comments)    Unknown reaction  . Duloxetine Other (See Comments)  . Fluoxetine Other (See Comments)    Unknown reaction  . Fluvoxamine Other (See Comments)    Unknown reaction  . Lexapro [Escitalopram Oxalate] Other (See Comments)    Unknown reaction  . Niacin And  Related Other (See Comments)    Unknown reaction  . Oxcarbazepine Other (See Comments)    Unknown reaction  . Pravastatin Other (See Comments)    Unknown reaction  . Quetiapine Other (See Comments)  . Seroquel [Quetiapine Fumarate] Other (See Comments)    Unknown reaction  . Statins Other (See Comments)    Unknown adverse reaction  . Tamsulosin Other (See Comments)    Unknown reaction  . Zoloft [Sertraline Hcl] Other (See Comments)     Unknown reaction    Family History: Family History  Problem Relation Age of Onset  . Headache Mother   . Cancer Father        Lung cancer  . Parkinson's disease Paternal Grandmother        Unknown if paternal or maternal side of family  . Alzheimer's disease Paternal Grandfather        Unknown if paternal or maternal side of family    Social History: Social History   Socioeconomic History  . Marital status: Married    Spouse name: Vaughan Basta  . Number of children: 0  . Years of education: 2  . Highest education level: Not on file  Occupational History  . Occupation: Retired Scientist, clinical (histocompatibility and immunogenetics)  Tobacco Use  . Smoking status: Former Smoker    Quit date: 10/30/1988    Years since quitting: 30.5  . Smokeless tobacco: Never Used  Substance and Sexual Activity  . Alcohol use: No    Alcohol/week: 0.0 standard drinks  . Drug use: No  . Sexual activity: Not on file  Other Topics Concern  . Not on file  Social History Narrative   Married. Education: high school/other. Exercise: Yes.      Patient is right-handed.   Social Determinants of Health   Financial Resource Strain:   . Difficulty of Paying Living Expenses: Not on file  Food Insecurity:   . Worried About Charity fundraiser in the Last Year: Not on file  . Ran Out of Food in the Last Year: Not on file  Transportation Needs:   . Lack of Transportation (Medical): Not on file  . Lack of Transportation (Non-Medical): Not on file  Physical Activity:   . Days of Exercise per Week: Not on file  . Minutes of Exercise per Session: Not on file  Stress:   . Feeling of Stress : Not on file  Social Connections:   . Frequency of Communication with Friends and Family: Not on file  . Frequency of Social Gatherings with Friends and Family: Not on file  . Attends Religious Services: Not on file  . Active Member of Clubs or Organizations: Not on file  . Attends Archivist Meetings: Not on file  . Marital Status: Not on  file  Intimate Partner Violence:   . Fear of Current or Ex-Partner: Not on file  . Emotionally Abused: Not on file  . Physically Abused: Not on file  . Sexually Abused: Not on file    Observations/Objective:   Height 5\' 7"  (1.702 m), weight 168 lb (76.2 kg).  Assessment and Plan:   1.  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  He reports significant short term memory deficits that I find to be out of proportion to his mild deficits.   2.  Episodic confusion with speech disturbance.  Unlikely to be TIA or recrudescence of prior TIA as these spells have been recurrent multiple times and stereotypical.  Again, consider simple partial seizure vs complicated migraine.  3.  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  The positive visual phenomena is more characteristic of migraine. However, he has no history of migraine and continues to have some residual left sided deficits. 4. Hypertension  1.  Check 72 hour ambulatory EEG 2.  ASA for secondary stroke prevention 3.  Livalo (LDL goal less than 70) 4.  Routine follow up with vascular surgery. 5.  Follow up in 6 months.  Follow Up Instructions:    -I discussed the assessment and treatment plan with the patient. The patient was provided an opportunity to ask questions and all were answered. The patient agreed with the plan and demonstrated an understanding of the instructions.   The patient was advised to call back or seek an in-person evaluation if the symptoms worsen or if the condition fails to improve as anticipated.    Total Time spent in visit with the patient was:  15 minutes  Dudley Major, DO

## 2019-05-20 ENCOUNTER — Other Ambulatory Visit: Payer: Self-pay

## 2019-05-20 ENCOUNTER — Encounter: Payer: Self-pay | Admitting: Neurology

## 2019-05-20 ENCOUNTER — Telehealth (INDEPENDENT_AMBULATORY_CARE_PROVIDER_SITE_OTHER): Payer: Medicare Other | Admitting: Neurology

## 2019-05-20 VITALS — Ht 67.0 in | Wt 168.0 lb

## 2019-05-20 DIAGNOSIS — G3184 Mild cognitive impairment, so stated: Secondary | ICD-10-CM | POA: Diagnosis not present

## 2019-05-20 DIAGNOSIS — R410 Disorientation, unspecified: Secondary | ICD-10-CM | POA: Diagnosis not present

## 2019-05-23 ENCOUNTER — Ambulatory Visit: Payer: Medicare Other | Attending: Internal Medicine

## 2019-05-23 DIAGNOSIS — Z23 Encounter for immunization: Secondary | ICD-10-CM | POA: Insufficient documentation

## 2019-05-23 NOTE — Progress Notes (Signed)
   Covid-19 Vaccination Clinic  Name:  Albert Hayes    MRN: [id number] DOB: [birth year]  05/23/2019  Mr. Shorter was observed post Covid-19 immunization for 15 minutes without incidence. He was provided with Vaccine Information Sheet and instruction to access the V-Safe system.   [name] was instructed to call 911 with any severe reactions post vaccine: Marland Kitchen Difficulty breathing  . Swelling of your face and throat  . A fast heartbeat  . A bad rash all over your body  . Dizziness and weakness    Immunizations Administered    Name Date Dose VIS Date Route   Pfizer COVID-19 Vaccine 05/23/2019  8:34 AM 0.3 mL 03/20/2019 Intramuscular   Manufacturer: [place]   Lot: [id number]   [place]: [id number]

## 2019-05-27 ENCOUNTER — Telehealth: Payer: Self-pay | Admitting: Neurology

## 2019-05-27 NOTE — Telephone Encounter (Signed)
Patient called and cancelled his 28 hour/ [place] EEG on 05/29/19. He'd like to reschedule to the first Friday in March, 06/12/19, if available.  Routing to [place] for rescheduling.

## 2019-05-29 ENCOUNTER — Other Ambulatory Visit: Payer: Medicare Other

## 2019-06-12 ENCOUNTER — Other Ambulatory Visit: Payer: Medicare Other

## 2019-06-14 ENCOUNTER — Ambulatory Visit: Payer: Medicare Other | Attending: Internal Medicine

## 2019-06-14 DIAGNOSIS — Z23 Encounter for immunization: Secondary | ICD-10-CM | POA: Insufficient documentation

## 2019-06-14 NOTE — Progress Notes (Signed)
   Covid-19 Vaccination Clinic  Name:  Albert Hayes    MRN: [id number] DOB: 1945/08/16  06/14/2019  Mr. Buffone was observed post Covid-19 immunization for 30 minutes based on pre-vaccination screening without incident. He was provided with Vaccine Information Sheet and instruction to access the V-Safe system.   Mr. Caulk was instructed to call 911 with any severe reactions post vaccine: Marland Kitchen Difficulty breathing  . Swelling of face and throat  . A fast heartbeat  . A bad rash all over body  . Dizziness and weakness   Immunizations Administered    Name Date Dose VIS Date Route   Pfizer COVID-19 Vaccine 06/14/2019 10:22 AM 0.3 mL 03/20/2019 Intramuscular   Manufacturer: [place]   Lot: [id number]   [place]: [id number]

## 2019-06-19 ENCOUNTER — Other Ambulatory Visit: Payer: Self-pay

## 2019-06-19 ENCOUNTER — Ambulatory Visit (INDEPENDENT_AMBULATORY_CARE_PROVIDER_SITE_OTHER): Payer: Medicare Other | Admitting: Neurology

## 2019-06-19 DIAGNOSIS — R410 Disorientation, unspecified: Secondary | ICD-10-CM

## 2019-06-24 NOTE — Procedures (Signed)
ELECTROENCEPHALOGRAM REPORT  Dates of Recording: 06/19/2019 at 09:38 to 06/22/2019 at 09:22  Patient's Name: Albert Hayes MRN: [id number] Date of Birth: 1945/12/05  Procedure: 72-hour ambulatory EEG  History: 74 year old male with intermittent episodes of confusion following right ICA syndrome s/p CEA.  Trouble explaining himself and may jumble his words.  Trouble with memory.  Usually lasts 3 to 4 days and may occur every 2 to 3 weeks.  Medications: ASA; Plavix; Livalo  Technical Summary: This is a 72-hour multichannel digital EEG recording measured by the international 10-20 system with electrodes applied with paste and impedances below 5000 ohms performed as portable with EKG monitoring.  The digital EEG was referentially recorded, reformatted, and digitally filtered in a variety of bipolar and referential montages for optimal display.    DESCRIPTION OF RECORDING: During maximal wakefulness, the background activity consisted of a symmetric 9Hz  posterior dominant rhythm which was reactive to eye opening.  There were no epileptiform discharges or focal slowing seen in wakefulness.  During the recording, the patient progresses through wakefulness, drowsiness, and Stage 2 sleep.  Again, there were no epileptiform discharges seen.  Events:  There were no electrographic seizures seen.  EKG lead was unremarkable.  IMPRESSION: This 72-hour ambulatory EEG study is normal.    CLINICAL CORRELATION: A normal EEG does not exclude a clinical diagnosis of epilepsy.  If further clinical questions remain, inpatient video EEG monitoring may be helpful.   Metta Clines, DO

## 2019-06-26 DIAGNOSIS — I6521 Occlusion and stenosis of right carotid artery: Secondary | ICD-10-CM | POA: Diagnosis not present

## 2019-06-26 DIAGNOSIS — I9581 Postprocedural hypotension: Secondary | ICD-10-CM | POA: Diagnosis not present

## 2019-06-26 DIAGNOSIS — I6523 Occlusion and stenosis of bilateral carotid arteries: Secondary | ICD-10-CM | POA: Diagnosis not present

## 2019-06-26 DIAGNOSIS — R011 Cardiac murmur, unspecified: Secondary | ICD-10-CM | POA: Diagnosis not present

## 2019-06-26 DIAGNOSIS — I10 Essential (primary) hypertension: Secondary | ICD-10-CM | POA: Diagnosis not present

## 2019-07-29 DIAGNOSIS — I517 Cardiomegaly: Secondary | ICD-10-CM | POA: Diagnosis not present

## 2019-07-29 DIAGNOSIS — I350 Nonrheumatic aortic (valve) stenosis: Secondary | ICD-10-CM | POA: Diagnosis not present

## 2019-08-05 ENCOUNTER — Ambulatory Visit: Payer: Medicare Other | Admitting: Podiatry

## 2019-08-05 ENCOUNTER — Encounter: Payer: Self-pay | Admitting: Podiatry

## 2019-08-05 ENCOUNTER — Other Ambulatory Visit: Payer: Self-pay

## 2019-08-05 VITALS — Temp 97.0°F

## 2019-08-05 DIAGNOSIS — B351 Tinea unguium: Secondary | ICD-10-CM

## 2019-08-05 DIAGNOSIS — M79674 Pain in right toe(s): Secondary | ICD-10-CM | POA: Diagnosis not present

## 2019-08-05 DIAGNOSIS — E119 Type 2 diabetes mellitus without complications: Secondary | ICD-10-CM

## 2019-08-05 DIAGNOSIS — M79675 Pain in left toe(s): Secondary | ICD-10-CM

## 2019-08-05 NOTE — Progress Notes (Signed)
This patient returns to my office for at risk foot care.  This patient requires this care by a professional since this patient will be at risk due to having  Diabetes.  This patient is unable to cut nails himself since the patient cannot reach his nails.These nails are painful walking and wearing shoes.  This patient presents for at risk foot care today.  General Appearance  Alert, conversant and in no acute stress.  Vascular  Dorsalis pedis and posterior tibial  pulses are palpable  bilaterally.  Capillary return is within normal limits  bilaterally. Temperature is within normal limits  bilaterally.  Neurologic  Senn-Weinstein monofilament wire test within normal limits  bilaterally. Muscle power within normal limits bilaterally.  Nails Thick disfigured discolored nails with subungual debris  Hallux  B/L.Marland Kitchen No evidence of bacterial infection or drainage bilaterally.  Orthopedic  No limitations of motion  feet .  No crepitus or effusions noted.  No bony pathology or digital deformities noted.  Skin  normotropic skin with no porokeratosis noted bilaterally.  No signs of infections or ulcers noted.     Onychomycosis  Pain in right toes  Pain in left toes  Consent was obtained for treatment procedures.   Mechanical debridement of nails 1-5  bilaterally performed with a nail nipper.  Filed with dremel without incident.    Return office visit    4 months                  Told patient to return for periodic foot care and evaluation due to potential at risk complications.   Gardiner Barefoot DPM

## 2019-09-03 ENCOUNTER — Telehealth (HOSPITAL_COMMUNITY): Payer: Self-pay

## 2019-09-03 NOTE — Telephone Encounter (Signed)
Pt insurance is active and benefits verified through [facility]. Co-pay $0.00, DED $0.00/$0.00 met, out of pocket $3,900.00/$362.06 met, co-insurance 0%. No pre-authorization required. Passport, 09/03/19 @ 4:21PM, YSO#[id number]  Will contact patient to see if he is interested in the Cardiac Rehab Program. Patient will be contacted for scheduling upon review by the RN Navigator.

## 2019-09-21 ENCOUNTER — Ambulatory Visit: Payer: Medicare Other | Admitting: Neurology

## 2019-09-29 DIAGNOSIS — Z8673 Personal history of transient ischemic attack (TIA), and cerebral infarction without residual deficits: Secondary | ICD-10-CM | POA: Insufficient documentation

## 2019-10-06 ENCOUNTER — Telehealth (HOSPITAL_COMMUNITY): Payer: Self-pay

## 2019-10-06 NOTE — Telephone Encounter (Signed)
Still have not recv'd request fax information. Will resend fax request today.

## 2019-10-12 DIAGNOSIS — Z952 Presence of prosthetic heart valve: Secondary | ICD-10-CM | POA: Insufficient documentation

## 2019-10-16 ENCOUNTER — Telehealth (HOSPITAL_COMMUNITY): Payer: Self-pay | Admitting: *Deleted

## 2019-10-16 NOTE — Telephone Encounter (Signed)
Received referral from Dr Danise Mina at [facility] for this pt to participate in Cardiac rehab after same day PCI in May.  Rehab ws placed on hold due to Work up for TAVR procedure.  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  Called and spoke to pt regarding the cardiac rehab program.  Pt lives in [place] and would like to go to [facility].  However pt feels he most  Likely not want  to participate again.  Pt participated at [facility] in 2019. Pt feels he is fairly active and follows heart healthy diet. Pt is also concerned about the cost. Encourage pt to discuss with providers on follow up. Mailed pt brochure for cardiac rehab to review along with contact information for the [facility] program should he decide to participate. Cherre Huger, BSN Cardiac and Training and development officer

## 2019-11-10 ENCOUNTER — Ambulatory Visit: Payer: Medicare Other | Admitting: Neurology

## 2019-12-09 ENCOUNTER — Encounter: Payer: Self-pay | Admitting: Podiatry

## 2019-12-09 ENCOUNTER — Ambulatory Visit (INDEPENDENT_AMBULATORY_CARE_PROVIDER_SITE_OTHER): Payer: Medicare Other | Admitting: Podiatry

## 2019-12-09 ENCOUNTER — Other Ambulatory Visit: Payer: Self-pay

## 2019-12-09 DIAGNOSIS — M79675 Pain in left toe(s): Secondary | ICD-10-CM | POA: Diagnosis not present

## 2019-12-09 DIAGNOSIS — E119 Type 2 diabetes mellitus without complications: Secondary | ICD-10-CM | POA: Diagnosis not present

## 2019-12-09 DIAGNOSIS — M79674 Pain in right toe(s): Secondary | ICD-10-CM | POA: Diagnosis not present

## 2019-12-09 DIAGNOSIS — B351 Tinea unguium: Secondary | ICD-10-CM

## 2019-12-09 NOTE — Progress Notes (Signed)
This patient returns to my office for at risk foot care.  This patient requires this care by a professional since this patient will be at risk due to having  Diabetes.  This patient is unable to cut nails himself since the patient cannot reach his nails.These nails are painful walking and wearing shoes.  This patient presents for at risk foot care today.  General Appearance  Alert, conversant and in no acute stress.  Vascular  Dorsalis pedis and posterior tibial  pulses are palpable  bilaterally.  Capillary return is within normal limits  bilaterally. Temperature is within normal limits  bilaterally.  Neurologic  Senn-Weinstein monofilament wire test within normal limits  bilaterally. Muscle power within normal limits bilaterally.  Nails Thick disfigured discolored nails with subungual debris  Hallux  B/L.. No evidence of bacterial infection or drainage bilaterally.  Orthopedic  No limitations of motion  feet .  No crepitus or effusions noted.  No bony pathology or digital deformities noted.  Skin  normotropic skin with no porokeratosis noted bilaterally.  No signs of infections or ulcers noted.     Onychomycosis  Pain in right toes  Pain in left toes  Consent was obtained for treatment procedures.   Mechanical debridement of nails 1-5  bilaterally performed with a nail nipper.  Filed with dremel without incident.    Return office visit    3 months                  Told patient to return for periodic foot care and evaluation due to potential at risk complications.   Seniya Stoffers DPM  

## 2019-12-14 IMAGING — MR MR HEAD W/O CM
9 of 10 series · 42 of 48 positions shown · non-contrast
Comparison: The diffusion-weighted images demonstrate no acute or
subacute infarction.

CLINICAL DATA: Stroke, follow-up. Stroke symptoms yesterday. Right
internal carotid artery stenosis.

EXAM:
MRI HEAD WITHOUT CONTRAST
TECHNIQUE: Multiplanar, multiecho pulse sequences of the brain and surrounding
structures were obtained without intravenous contrast.

[Series 5: DWI · axial · 4.0mm · 0.92mm/px · z∈[-56,+92]mm · 7 of 76 slices shown (1 of 4)]
[im 1/76]
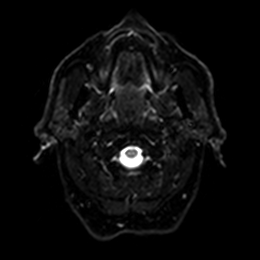
[im 13/76]
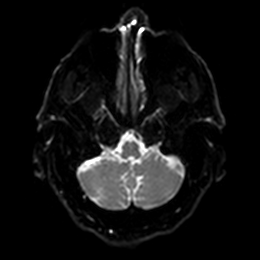
[im 26/76]
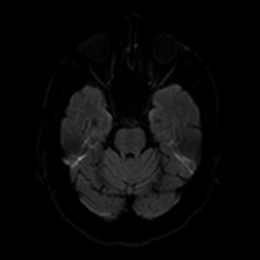
[im 38/76]
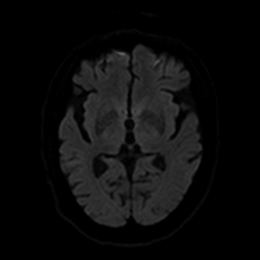
[im 51/76]
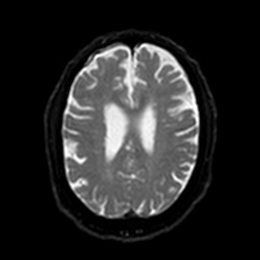
[im 63/76]
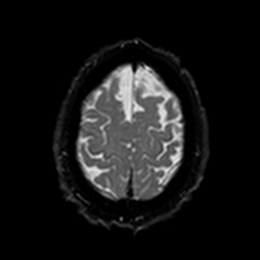
[im 76/76]
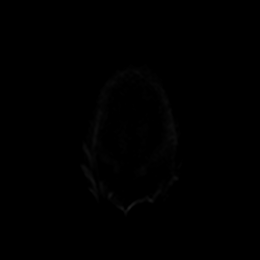

[Series 6: DWI · axial · 4.0mm · 0.92mm/px · z∈[-56,+92]mm · 3 of 38 slices shown (2 of 4)]
[im 1/38]
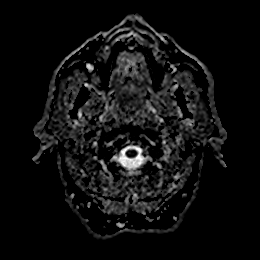
[im 19/38]
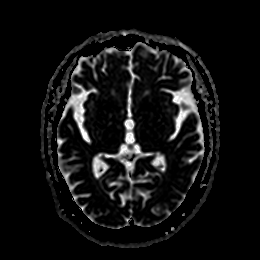
[im 38/38]
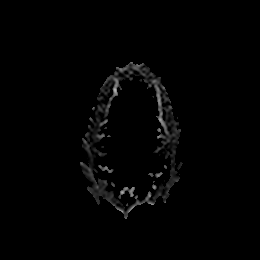

[Series 7: DWI · coronal · 4.0mm · 0.88mm/px · 8 of 72 slices shown (3 of 4)]
[im 1/72]
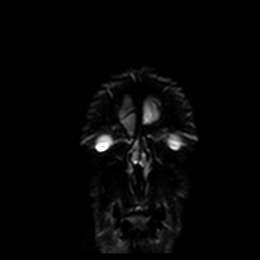
[im 11/72]
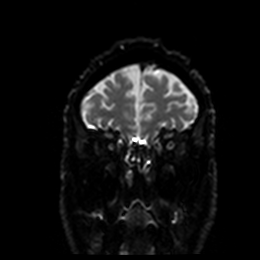
[im 21/72]
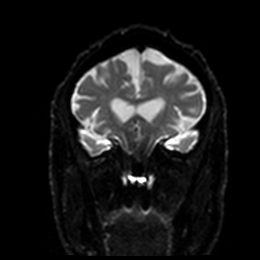
[im 31/72]
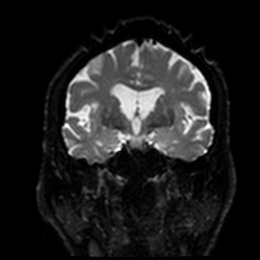
[im 41/72]
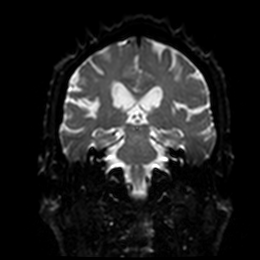
[im 51/72]
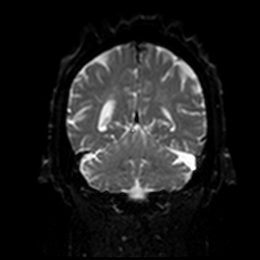
[im 61/72]
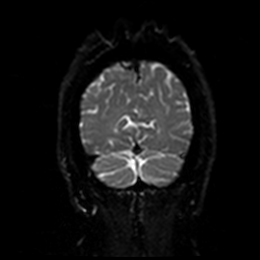
[im 72/72]
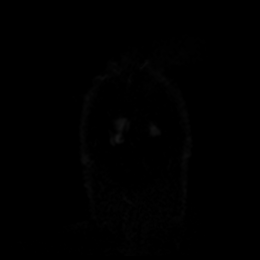

[Series 8: DWI · coronal · 4.0mm · 0.88mm/px · 4 of 36 slices shown (4 of 4)]
[im 1/36]
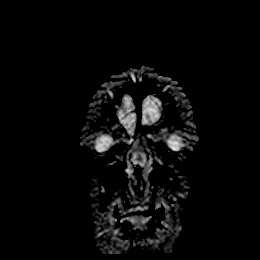
[im 12/36]
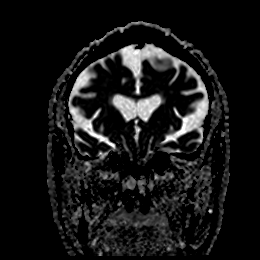
[im 24/36]
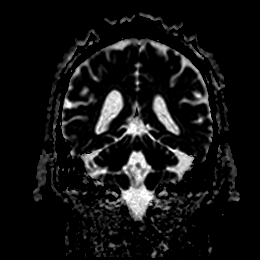
[im 36/36]
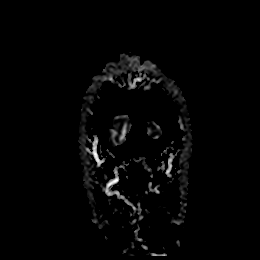

[Series 9: T2 · axial · 5.0mm · 0.72mm/px · z∈[-55,+89]mm · 3 of 25 slices shown (1 of 2)]
[im 1/25]
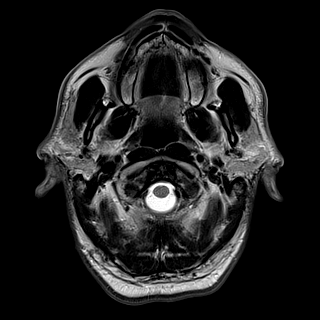
[im 13/25]
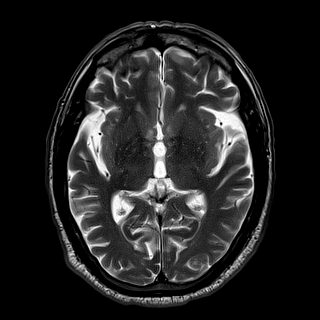
[im 25/25]
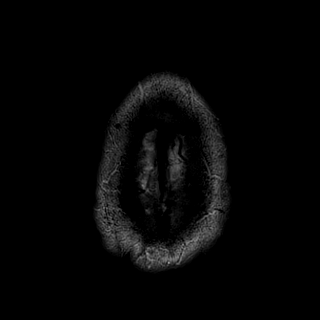

[Series 10: FLAIR · axial · 5.0mm · 0.45mm/px · z∈[-55,+88]mm · 3 of 25 slices shown]
[im 1/25]
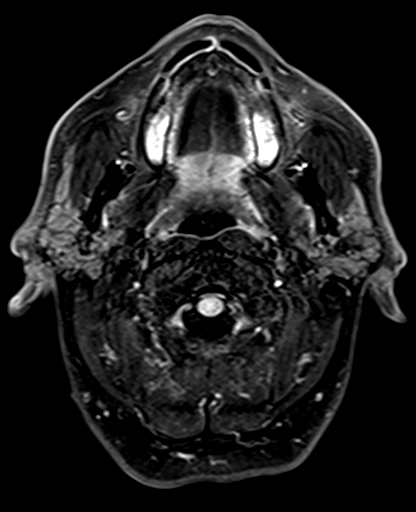
[im 13/25]
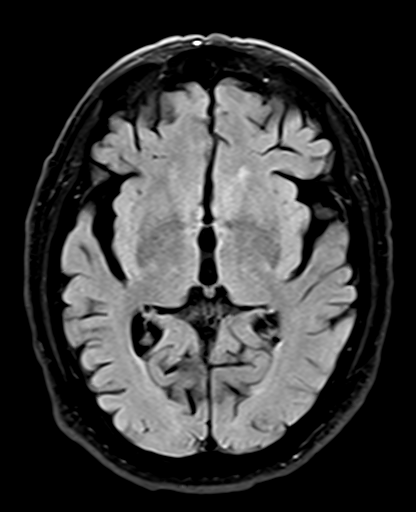
[im 25/25]
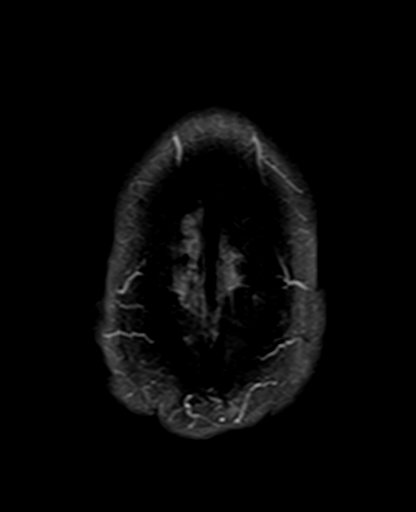

[Series 11: swi_images · axial · 3.0mm · 0.90mm/px · z∈[-58,+95]mm · 6 of 52 slices shown]
[im 1/52]
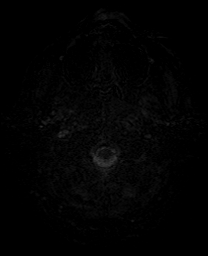
[im 11/52]
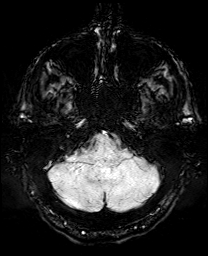
[im 21/52]
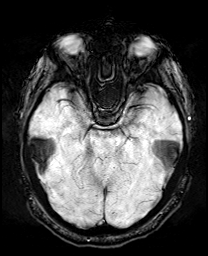
[im 31/52]
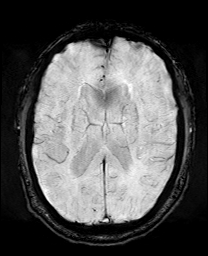
[im 41/52]
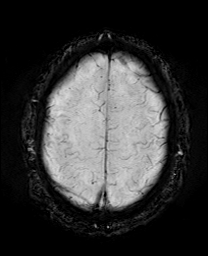
[im 52/52]
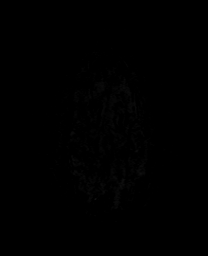

[Series 12: mip_images(sw) · axial · 24.0mm · 0.90mm/px · z∈[-48,+84]mm · 5 of 45 slices shown]
[im 1/45]
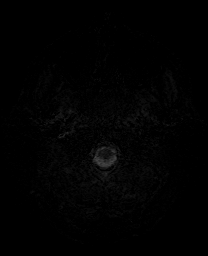
[im 12/45]
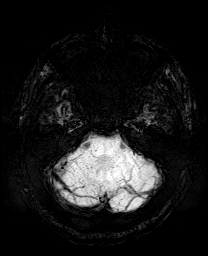
[im 23/45]
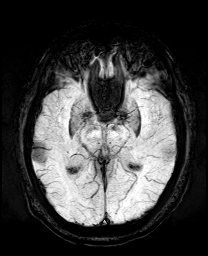
[im 34/45]
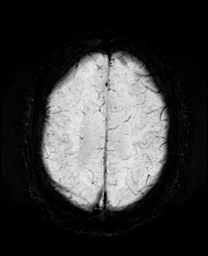
[im 45/45]
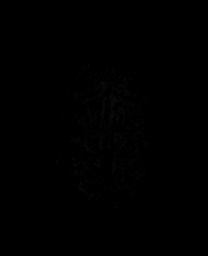

[Series 14: T2 · coronal · 5.0mm · 0.34mm/px · 3 of 27 slices shown (2 of 2)]
[im 1/27]
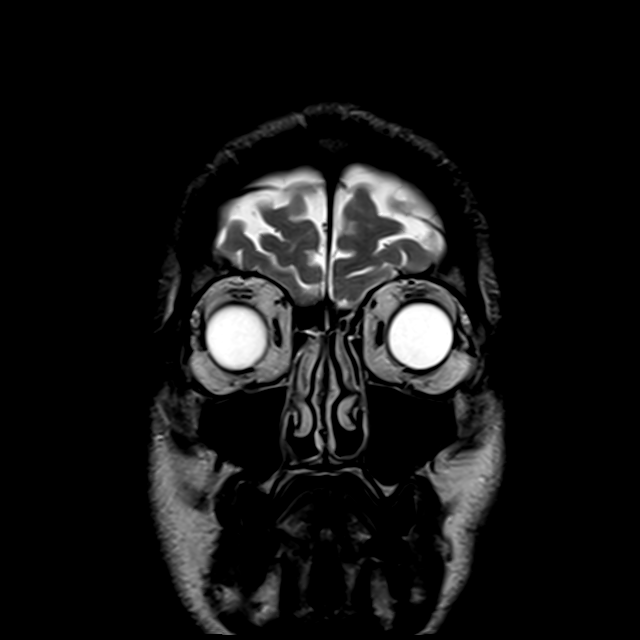
[im 14/27]
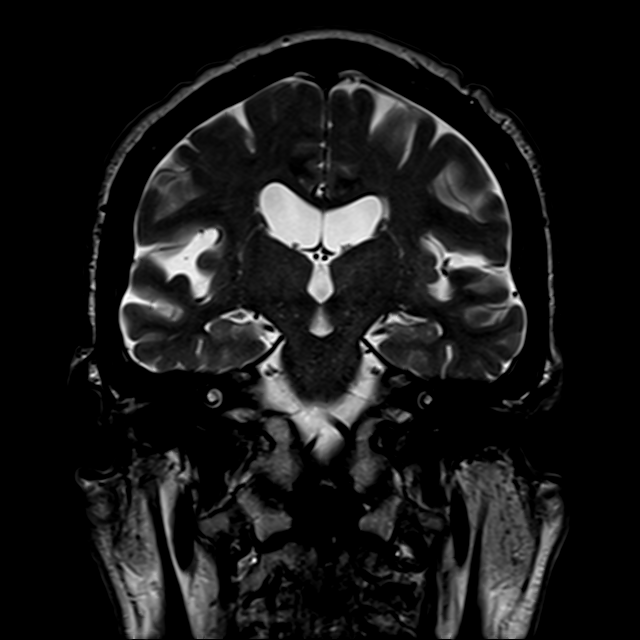
[im 27/27]
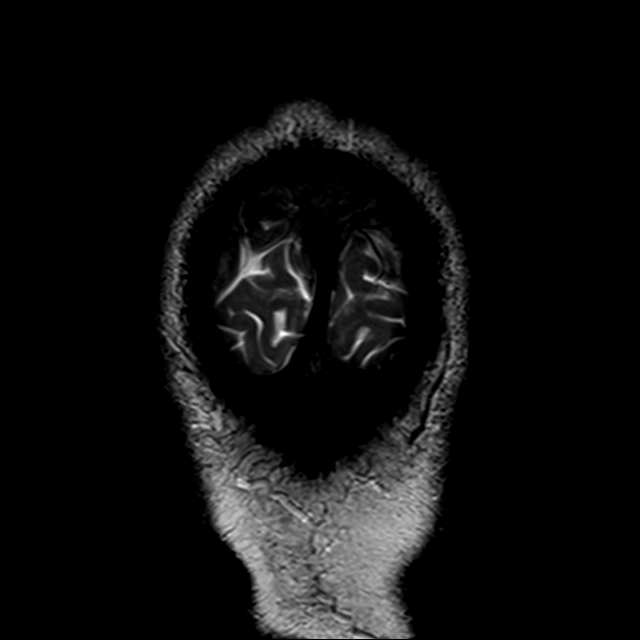

[42 of 48 positions shown; findings below may reference images not displayed]

FINDINGS: Brain: Moderate atrophy and mild white matter changes are present
bilaterally. This likely reflects the sequela of chronic
microvascular ischemia. White matter changes extend into the
brainstem. The cerebellum is within normal limits. No hemorrhage or
mass lesion is present. The ventricles are normal size.

Vascular: Flow is present in the major intracranial arteries.

Skull and upper cervical spine: The skull base is within normal
limits.

Sinuses/Orbits: The paranasal sinuses are clear. There is minimal
fluid in the right mastoid air cells. No obstructing nasopharyngeal
lesion is present. Bilateral lens replacements are present. Globes
and orbits are within normal limits.
IMPRESSION: 1. No acute or subacute infarct.
2. Moderate atrophy and mild white matter changes are advanced for
age.

## 2020-01-25 ENCOUNTER — Ambulatory Visit: Payer: Medicare Other | Admitting: Neurology

## 2020-03-11 ENCOUNTER — Encounter: Payer: Self-pay | Admitting: Podiatry

## 2020-03-11 ENCOUNTER — Other Ambulatory Visit: Payer: Self-pay

## 2020-03-11 ENCOUNTER — Ambulatory Visit: Payer: Medicare Other | Admitting: Podiatry

## 2020-03-11 DIAGNOSIS — E119 Type 2 diabetes mellitus without complications: Secondary | ICD-10-CM

## 2020-03-11 DIAGNOSIS — M79674 Pain in right toe(s): Secondary | ICD-10-CM

## 2020-03-11 DIAGNOSIS — M79675 Pain in left toe(s): Secondary | ICD-10-CM | POA: Diagnosis not present

## 2020-03-11 DIAGNOSIS — B351 Tinea unguium: Secondary | ICD-10-CM | POA: Diagnosis not present

## 2020-03-11 NOTE — Progress Notes (Signed)
This patient returns to my office for at risk foot care.  This patient requires this care by a professional since this patient will be at risk due to having  Diabetes.  This patient is unable to cut nails himself since the patient cannot reach his nails.These nails are painful walking and wearing shoes.  This patient presents for at risk foot care today.  General Appearance  Alert, conversant and in no acute stress.  Vascular  Dorsalis pedis and posterior tibial  pulses are palpable  bilaterally.  Capillary return is within normal limits  bilaterally. Temperature is within normal limits  bilaterally.  Neurologic  Senn-Weinstein monofilament wire test within normal limits  bilaterally. Muscle power within normal limits bilaterally.  Nails Thick disfigured discolored nails with subungual debris  Hallux  B/L.Marland Kitchen No evidence of bacterial infection or drainage bilaterally.  Orthopedic  No limitations of motion  feet .  No crepitus or effusions noted.  No bony pathology or digital deformities noted.  Skin  normotropic skin with no porokeratosis noted bilaterally.  No signs of infections or ulcers noted.     Onychomycosis  Pain in right toes  Pain in left toes  Consent was obtained for treatment procedures.   Mechanical debridement of nails 1-5  bilaterally performed with a nail nipper.  Filed with dremel without incident.    Return office visit    3 months                  Told patient to return for periodic foot care and evaluation due to potential at risk complications.   Gardiner Barefoot DPM

## 2020-06-10 ENCOUNTER — Ambulatory Visit: Payer: Medicare Other | Admitting: Podiatry

## 2020-09-21 ENCOUNTER — Ambulatory Visit
Admission: EM | Admit: 2020-09-21 | Discharge: 2020-09-21 | Disposition: A | Discharge status: Home / Self Care | Payer: Medicare Other | Attending: Emergency Medicine | Admitting: Emergency Medicine

## 2020-09-21 ENCOUNTER — Other Ambulatory Visit: Payer: Self-pay

## 2020-09-21 DIAGNOSIS — S9001XA Contusion of right ankle, initial encounter: Secondary | ICD-10-CM | POA: Diagnosis not present

## 2020-09-21 DIAGNOSIS — W57XXXA Bitten or stung by nonvenomous insect and other nonvenomous arthropods, initial encounter: Secondary | ICD-10-CM | POA: Diagnosis not present

## 2020-09-21 DIAGNOSIS — S90561A Insect bite (nonvenomous), right ankle, initial encounter: Secondary | ICD-10-CM

## 2020-09-21 MED ORDER — DICLOFENAC SODIUM 1 % EX GEL: 2.0000 g | Freq: Four times a day (QID) | CUTANEOUS | 0 refills | Status: DC

## 2020-09-21 MED ORDER — TRIAMCINOLONE ACETONIDE 0.1 % EX CREA: 1.0000 "application " | TOPICAL_CREAM | Freq: Two times a day (BID) | CUTANEOUS | 0 refills | Status: DC

## 2020-09-21 NOTE — ED Triage Notes (Signed)
Over one week h/o insect bite on the inside of his right ankle after doing yard work. Pt reports at the onset the bite was tender to the touch and since the onset he has had swelling with bruising radiating down his heel. No other bites noted. No meds taken.

## 2020-09-21 NOTE — ED Provider Notes (Signed)
[facility]    CSN: [id number] Arrival date & time: 09/21/20  0810      History   Chief Complaint Chief Complaint  Patient presents with   Insect Bite    HPI Albert Hayes is a 75 y.o. male history of asthma, COPD, CAD, DM type II, GERD, hypertension, presenting today for evaluation of an insect bite.  Reports he remembers getting stung/bit while in the yard approximately 1.5 weeks ago.  He reports initially and not to the area without associated erythema.  Since he developed some bruising and has had discoloration and bruising spreading into ankle and foot.  He denies pain in the foot or ankle, continues to have pain in the area of swelling.  Denies fevers dizziness lightheadedness.  Denies any nausea vomiting diarrhea or abdominal pain.  Denies any myalgias/arthralgias.  Denies known recent tick bite.  Denies other rashes.  HPI  Past Medical History:  Diagnosis Date   Arteriosclerosis of coronary artery 12/22/2014   Asthma    Benign prostatic hyperplasia without lower urinary tract symptoms 09/13/2012   Cervical spondylosis 02/27/2013   Chronic idiopathic constipation 11/02/2015   Chronic obstructive pulmonary disease 12/22/2014   Coronary artery disease    DDD (degenerative disc disease), cervical 02/27/2013   DDD (degenerative disc disease), lumbosacral 02/27/2013   Diabetes mellitus without complication    Dysphagia 10/18/2016   ED (erectile dysfunction)    Generalized anxiety disorder 01/21/2013   GERD (gastroesophageal reflux disease)    Heart murmur    EJECTION MURMUR   HLD (hyperlipidemia)    Hx of colon polyps    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   Sleep apnea    Hypertension    GERD (gastroesophageal reflux disease)    Diabetes mellitus without complication (HCC)    COPD (chronic obstructive pulmonary disease) (HCC)    Coronary artery disease    Status post coronary artery stent placement 10/30/2017   Dysphagia 10/18/2016   Abnormal vision 07/31/2016   Constipation 07/31/2016   Primary erectile dysfunction 07/31/2016   Chronic idiopathic constipation 11/02/2015   History of colonic polyps 11/02/2015   Allied disorder of spine 10/21/2015   Biomechanical lesion, unspecified 10/21/2015   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   Low back pain 10/21/2015   Neck pain 10/21/2015   Thoracic or lumbosacral neuritis or radiculitis 10/21/2015   Arteriosclerosis of coronary artery 12/22/2014   Chronic obstructive pulmonary disease ([place]) 12/22/2014   H/O coronary artery bypass surgery 12/22/2014   HLD (hyperlipidemia) 12/22/2014   DDD (degenerative disc disease), lumbosacral 02/27/2013   DDD (degenerative disc disease), cervical 02/27/2013   Cervical spondylosis 02/27/2013   Lumbosacral spondylosis 02/27/2013   Post-traumatic stress disorder, unspecified 02/27/2013   Prostate  disease 02/27/2013   Sleeping difficulty 02/27/2013   Spinal stenosis in cervical region 02/27/2013   Transient alteration of awareness 02/27/2013   Generalized anxiety disorder 01/21/2013   Major depressive disorder, 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[place]) 01/21/2013   Benign prostatic hyperplasia without lower urinary tract symptoms 09/13/2012    Personal history of tobacco use, presenting hazards to health 11/08/2008    Past Surgical History:  Procedure Laterality Date   COLONOSCOPY     COLONOSCOPY WITH PROPOFOL N/A 10/31/2016   Procedure: COLONOSCOPY WITH PROPOFOL;  Surgeon: Manya Silvas, MD;  Location: [facility] ENDOSCOPY;  Service: Endoscopy;  Laterality: N/A;   CORONARY ANGIOPLASTY     CORONARY ARTERY BYPASS GRAFT     ESOPHAGOGASTRODUODENOSCOPY  2016   SEPTOPLASTY     spine/bone surgery     TONSILLECTOMY         Home Medications    Prior to Admission medications   Medication Sig Start Date End Date Taking? Authorizing Provider  diclofenac Sodium (VOLTAREN) 1 % GEL Apply 2 g topically 4 (four) times daily. 09/21/20  Yes Samin Milke C, PA-C  triamcinolone cream (KENALOG) 0.1 % Apply 1 application topically 2 (two) times daily. 09/21/20  Yes Markayla Reichart C, PA-C  amLODipine (NORVASC) 10 MG tablet Take by mouth. 11/16/19 11/15/20  [provider]  aspirin EC 81 MG tablet Take 81 mg by mouth daily.    [provider]  carvedilol (COREG) 3.125 MG tablet Take 3.125 mg by mouth 2 (two) times daily. 07/23/19   [provider]  clopidogrel (PLAVIX) 75 MG tablet Take 75 mg by mouth daily. 02/10/20   [provider]  ezetimibe (ZETIA) 10 MG tablet Take 10 mg by mouth daily. 07/25/19   [provider]  Pitavastatin Calcium (LIVALO) 2 MG TABS Take 2 mg by mouth at bedtime. Reported on 05/25/2015    [provider]  ramipril (ALTACE) 2.5 MG capsule Take 2.5 mg by mouth daily. 02/07/20   [provider]    Family History Family History  Problem Relation Age of Onset   Headache Mother    Cancer Father        Lung cancer   Parkinson's disease Paternal Grandmother        Unknown if paternal or maternal side of family   Alzheimer's disease Paternal Grandfather        Unknown if paternal or maternal side of family    Social History Social History   Tobacco Use    Smoking status: Former    Pack years: 0.00    Types: Cigarettes    Quit date: 10/30/1988    Years since quitting: 31.9   Smokeless tobacco: Never  Vaping Use   Vaping Use: Never used  Substance Use Topics   Alcohol use: No    Alcohol/week: 0.0 standard drinks   Drug use: No     Allergies   Penicillins, Xanax [alprazolam], Atorvastatin, Bupropion, Citalopram, Cymbalta [duloxetine hcl], Duloxetine, Fluoxetine, Fluvoxamine, Lexapro [escitalopram oxalate], Niacin and related, Oxcarbazepine, Pravastatin, Quetiapine, Seroquel [quetiapine fumarate], Statins, Tamsulosin, and Zoloft [sertraline hcl]   Review of Systems Review of Systems  Constitutional:  Negative for fatigue and fever.  Eyes:  Negative for redness, itching and visual disturbance.  Respiratory:  Negative for shortness of breath.   Cardiovascular:  Negative for chest pain and leg swelling.  Gastrointestinal:  Negative for nausea and vomiting.  Musculoskeletal:  Positive for arthralgias. Negative for myalgias.  Skin:  Positive for color change. Negative for rash and wound.  Neurological:  Negative for dizziness, syncope, weakness, light-headedness and headaches.    Physical Exam Triage Vital Signs ED Triage Vitals  Enc Vitals Group     BP      Pulse      Resp      Temp      Temp src      SpO2      Weight      Height      Head Circumference      Peak Flow      Pain Score      Pain Loc      Pain Edu?      Excl. in [place]?    No data found.  Updated Vital Signs BP 131/75 (BP Location: Left Arm)   Pulse (!) 58   Temp 98.3 F (36.8 C) (Oral)   Resp 18   SpO2 98%   Visual Acuity Right Eye Distance:   Left Eye Distance:   Bilateral Distance:    Right Eye Near:   Left Eye Near:    Bilateral Near:     Physical Exam Vitals and nursing note reviewed.  Constitutional:      Appearance: He is well-developed.     Comments: No acute distress  HENT:     Head: Normocephalic and atraumatic.     Nose: Nose  normal.  Eyes:     Conjunctiva/sclera: Conjunctivae normal.  Cardiovascular:     Rate and Rhythm: Normal rate.  Pulmonary:     Effort: Pulmonary effort is normal. No respiratory distress.  Abdominal:     General: There is no distension.  Musculoskeletal:        General: Normal range of motion.     Cervical back: Neck supple.     Comments: Right medial aspect of ankle with small scabbed area noted, area of swelling and tenderness just superior with mild overlying yellow discoloration, more purple discoloration noted along medial aspect of foot without associated tenderness or swelling  Right dorsum of foot without tenderness to palpation, dorsalis pedis 2+, sensation intact distally  Skin:    General: Skin is warm and dry.  Neurological:     Mental Status: He is alert and oriented to person, place, and time.     UC Treatments / Results  Labs (all labs ordered are listed, but only abnormal results are displayed) Labs Reviewed - No data to display  EKG   Radiology No results found.  Procedures Procedures (including critical care time)  Medications Ordered in UC Medications - No data to display  Initial Impression / Assessment and Plan / UC Course  I have reviewed the triage vital signs and the nursing notes.  Pertinent labs & imaging results that were available during my care of the patient were reviewed by me and considered in my medical decision making (see chart for details).     Insect bite versus sting with associated ecchymosis, suspect spreading into the foot likely from gravity, declines injury, suspect more soft tissue swelling.  Recommending Tylenol and then topical triamcinolone and diclofenac with ice and heat and continued monitoring.  Low suspicion of DVT.  No systemic symptoms.  Vital signs stable.  Discussed strict return precautions. Patient verbalized understanding and is agreeable with plan.  Final Clinical Impressions(s) / UC Diagnoses   Final  diagnoses:  Insect bite of right ankle, initial encounter  Traumatic ecchymosis of right ankle, initial encounter     Discharge Instructions      Please apply  triamcinolone cream twice daily to area to help with swelling and inflammation Please also use diclofenac 4 times daily to further also help with swelling, inflammation as well as pain Alternate ice and heat to area Please monitor over the next week and ensure pain swelling, bruising gradually resolving     ED Prescriptions     Medication Sig Dispense Auth. Provider   triamcinolone cream (KENALOG) 0.1 % Apply 1 application topically 2 (two) times daily. 30 g Halayna Blane C, PA-C   diclofenac Sodium (VOLTAREN) 1 % GEL Apply 2 g topically 4 (four) times daily. 100 g Yoneko Talerico, [place] C, PA-C      PDMP not reviewed this encounter.   Janith Lima, [place] 09/21/20 [phone]

## 2020-09-21 NOTE — Discharge Instructions (Addendum)
Please apply triamcinolone cream twice daily to area to help with swelling and inflammation Please also use diclofenac 4 times daily to further also help with swelling, inflammation as well as pain Alternate ice and heat to area Please monitor over the next week and ensure pain swelling, bruising gradually resolving

## 2021-01-17 ENCOUNTER — Other Ambulatory Visit: Payer: Self-pay | Admitting: Cardiology

## 2021-01-17 DIAGNOSIS — I6529 Occlusion and stenosis of unspecified carotid artery: Secondary | ICD-10-CM

## 2021-01-25 ENCOUNTER — Other Ambulatory Visit: Payer: Self-pay

## 2021-01-25 ENCOUNTER — Ambulatory Visit
Admission: RE | Admit: 2021-01-25 | Discharge: 2021-01-25 | Disposition: A | Discharge status: Home / Self Care | Payer: Medicare Other | Source: Ambulatory Visit | Attending: Cardiology | Admitting: Cardiology

## 2021-01-25 DIAGNOSIS — I6529 Occlusion and stenosis of unspecified carotid artery: Secondary | ICD-10-CM | POA: Insufficient documentation

## 2021-01-25 MED ORDER — IOHEXOL 350 MG/ML SOLN
75.0000 mL | Freq: Once | INTRAVENOUS | Status: AC | PRN
  Administered 2021-01-25: 75 mL via INTRAVENOUS

## 2021-08-21 ENCOUNTER — Encounter (INDEPENDENT_AMBULATORY_CARE_PROVIDER_SITE_OTHER): Payer: Medicare Other | Admitting: Vascular Surgery

## 2021-08-25 ENCOUNTER — Other Ambulatory Visit (INDEPENDENT_AMBULATORY_CARE_PROVIDER_SITE_OTHER): Payer: Self-pay | Admitting: Nurse Practitioner

## 2021-08-25 DIAGNOSIS — I6529 Occlusion and stenosis of unspecified carotid artery: Secondary | ICD-10-CM

## 2021-08-28 ENCOUNTER — Ambulatory Visit (INDEPENDENT_AMBULATORY_CARE_PROVIDER_SITE_OTHER): Payer: Medicare Other | Admitting: Vascular Surgery

## 2021-08-28 ENCOUNTER — Ambulatory Visit (INDEPENDENT_AMBULATORY_CARE_PROVIDER_SITE_OTHER): Payer: Medicare Other

## 2021-08-28 ENCOUNTER — Encounter (INDEPENDENT_AMBULATORY_CARE_PROVIDER_SITE_OTHER): Payer: Self-pay | Admitting: Vascular Surgery

## 2021-08-28 VITALS — BP 101/66 | HR 57 | Resp 14 | Ht 67.0 in | Wt 154.0 lb

## 2021-08-28 DIAGNOSIS — I6529 Occlusion and stenosis of unspecified carotid artery: Secondary | ICD-10-CM

## 2021-08-28 DIAGNOSIS — I2510 Atherosclerotic heart disease of native coronary artery without angina pectoris: Secondary | ICD-10-CM | POA: Diagnosis not present

## 2021-08-28 DIAGNOSIS — J449 Chronic obstructive pulmonary disease, unspecified: Secondary | ICD-10-CM | POA: Diagnosis not present

## 2021-08-28 DIAGNOSIS — I10 Essential (primary) hypertension: Secondary | ICD-10-CM

## 2021-08-28 DIAGNOSIS — E785 Hyperlipidemia, unspecified: Secondary | ICD-10-CM

## 2021-08-28 DIAGNOSIS — I350 Nonrheumatic aortic (valve) stenosis: Secondary | ICD-10-CM | POA: Insufficient documentation

## 2021-09-02 ENCOUNTER — Encounter (INDEPENDENT_AMBULATORY_CARE_PROVIDER_SITE_OTHER): Payer: Self-pay | Admitting: Vascular Surgery

## 2021-09-02 NOTE — Progress Notes (Signed)
MRN : [id number]  Albert Hayes is a 76 y.o. ([birth year]) male who presents with chief complaint of check carotid arteries.  History of Present Illness:   The patient is seen for evaluation of carotid stenosis. The carotid stenosis was identified remotely.  The patient denies amaurosis fugax. There is no recent history of TIA symptoms or focal motor deficits. There is no prior documented CVA.  There is no history of migraine headaches. There is no history of seizures.  The patient is taking enteric-coated aspirin 81 mg daily.  No recent shortening of the patient's walking distance or new symptoms consistent with claudication.  No history of rest pain symptoms. No new ulcers or wounds of the lower extremities have occurred.  There is no history of DVT, PE or superficial thrombophlebitis. No recent episodes of angina or shortness of breath documented.    Duplex ultrasound done here today shows 1-39% bilateral ICA stenosis  Current Meds  Medication Sig   aspirin EC 81 MG tablet Take 81 mg by mouth daily.   carvedilol (COREG) 3.125 MG tablet Take 3.125 mg by mouth 2 (two) times daily.   clopidogrel (PLAVIX) 75 MG tablet Take 75 mg by mouth daily.   ezetimibe (ZETIA) 10 MG tablet Take 10 mg by mouth daily.   ramipril (ALTACE) 2.5 MG capsule Take 2.5 mg by mouth daily.    Past Medical History:  Diagnosis Date   Arteriosclerosis of coronary artery 12/22/2014   Asthma    Benign prostatic hyperplasia without lower urinary tract symptoms 09/13/2012   Cervical spondylosis 02/27/2013   Chronic idiopathic constipation 11/02/2015   Chronic obstructive pulmonary disease 12/22/2014   Coronary artery disease    DDD (degenerative disc disease), cervical 02/27/2013   DDD (degenerative disc disease), lumbosacral 02/27/2013   Diabetes mellitus without complication    Dysphagia 10/18/2016   ED (erectile dysfunction)    Generalized anxiety disorder 01/21/2013   GERD (gastroesophageal  reflux disease)    Heart murmur    EJECTION MURMUR   HLD (hyperlipidemia)    Hx of colon polyps    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   Thrombocytopenia ([place])    Vitamin D deficiency     Past Surgical History:  Procedure Laterality Date   COLONOSCOPY     COLONOSCOPY WITH PROPOFOL N/A 10/31/2016   Procedure: COLONOSCOPY WITH PROPOFOL;  Surgeon: Manya Silvas, MD;  Location: [facility] ENDOSCOPY;  Service: Endoscopy;  Laterality: N/A;   CORONARY ANGIOPLASTY     CORONARY ARTERY BYPASS GRAFT     ESOPHAGOGASTRODUODENOSCOPY  2016   SEPTOPLASTY     spine/bone surgery     TONSILLECTOMY      Social History Social History   Tobacco Use   Smoking status: Former    Types: Cigarettes    Quit date: 10/30/1988    Years since quitting: 32.8   Smokeless tobacco: Never  Vaping Use   Vaping Use: Never used  Substance Use Topics   Alcohol use: No    Alcohol/week: 0.0 standard drinks   Drug use: No  Family History Family History  Problem Relation Age of Onset   Headache Mother    Cancer Father        Lung cancer   Parkinson's disease Paternal Grandmother        Unknown if paternal or maternal side of family   Alzheimer's disease Paternal Grandfather        Unknown if paternal or maternal side of family    Allergies  Allergen Reactions   Penicillins Other (See Comments)    Unknown childhood allergic reaction - severe Has patient had a PCN reaction causing immediate rash, facial/tongue/throat swelling, SOB or lightheadedness with hypotension: Unknown Has patient had a PCN  reaction causing severe rash involving mucus membranes or skin necrosis: Unknown Has patient had a PCN reaction that required hospitalization: Yes Has patient had a PCN reaction occurring within the last 10 years: No If all of the above answers are "NO", then may proceed with Cephalosporin use.   Xanax [Alprazolam] Shortness Of Breath   Atorvastatin Other (See Comments)    Difficulty breathing    Bupropion Other (See Comments)    Unknown reaction   Citalopram Other (See Comments)    Unknown reaction   Cymbalta [Duloxetine Hcl] Other (See Comments)    Unknown reaction   Duloxetine Other (See Comments)   Fluoxetine Other (See Comments)    Unknown reaction   Fluvoxamine Other (See Comments)    Unknown reaction   Lexapro [Escitalopram Oxalate] Other (See Comments)    Unknown reaction   Niacin And Related Other (See Comments)    Unknown reaction   Oxcarbazepine Other (See Comments)    Unknown reaction   Pravastatin Other (See Comments)    Unknown reaction   Quetiapine Other (See Comments)   Seroquel [Quetiapine Fumarate] Other (See Comments)    Unknown reaction   Statins Other (See Comments)    Unknown adverse reaction   Tamsulosin Other (See Comments)    Unknown reaction   Zoloft [Sertraline Hcl] Other (See Comments)    Unknown reaction     REVIEW OF SYSTEMS (Negative unless checked)  Constitutional: '[]'$ Weight loss  '[]'$ Fever  '[]'$ Chills Cardiac: '[]'$ Chest pain   '[]'$ Chest pressure   '[]'$ Palpitations   '[]'$ Shortness of breath when laying flat   '[]'$ Shortness of breath with exertion. Vascular:  '[x]'$ Pain in legs with walking   '[]'$ Pain in legs at rest  '[]'$ History of DVT   '[]'$ Phlebitis   '[]'$ Swelling in legs   '[]'$ Varicose veins   '[]'$ Non-healing ulcers Pulmonary:   '[]'$ Uses home oxygen   '[]'$ Productive cough   '[]'$ Hemoptysis   '[]'$ Wheeze  '[]'$ COPD   '[]'$ Asthma Neurologic:  '[]'$ Dizziness   '[]'$ Seizures   '[]'$ History of stroke   '[]'$ History of TIA  '[]'$ Aphasia   '[]'$ Vissual changes   '[]'$ Weakness or numbness in arm   '[]'$ Weakness or  numbness in leg Musculoskeletal:   '[]'$ Joint swelling   '[]'$ Joint pain   '[]'$ Low back pain Hematologic:  '[]'$ Easy bruising  '[]'$ Easy bleeding   '[]'$ Hypercoagulable state   '[]'$ Anemic Gastrointestinal:  '[]'$ Diarrhea   '[]'$ Vomiting  '[x]'$ Gastroesophageal reflux/heartburn   '[]'$ Difficulty swallowing. Genitourinary:  '[]'$ Chronic kidney disease   '[]'$ Difficult urination  '[]'$ Frequent urination   '[]'$ Blood in urine Skin:  '[]'$ Rashes   '[]'$ Ulcers  Psychological:  '[]'$ History of anxiety   '[]'$  History of major depression.  Physical Examination  Vitals:   08/28/21 1139 08/28/21 1140  BP: 98/61 101/66  Pulse: (!) 57   Resp: 14   Weight: 154 lb (69.9 kg)   Height: '5\' 7"'$  (1.702 m)  Body mass index is 24.12 kg/m. Gen: WD/WN, NAD Head: [place]/AT, No temporalis wasting.  Ear/Nose/Throat: Hearing grossly intact, nares w/o erythema or drainage Eyes: PER, EOMI, sclera nonicteric.  Neck: Supple, no masses.  No bruit or JVD.  Pulmonary:  Good air movement, no audible wheezing, no use of accessory muscles.  Cardiac: RRR, normal S1, S2, no Murmurs. Vascular:  carotid bruit noted Vessel Right Left  Radial Palpable Palpable  Carotid  Palpable  Palpable  Subclav  Palpable Palpable  Gastrointestinal: soft, non-distended. No guarding/no peritoneal signs.  Musculoskeletal: M/S 5/5 throughout.  No visible deformity.  Neurologic: CN 2-12 intact. Pain and light touch intact in extremities.  Symmetrical.  Speech is fluent. Motor exam as listed above. Psychiatric: Judgment intact, Mood & affect appropriate for pt's clinical situation. Dermatologic: No rashes or ulcers noted.  No changes consistent with cellulitis.   CBC Lab Results  Component Value Date   WBC 6.7 11/15/2017   HGB 16.3 11/15/2017   HCT 48.0 11/15/2017   MCV 88.5 11/15/2017   PLT 213 11/15/2017    BMET    Component Value Date/Time   NA 138 11/15/2017 1155   K 4.1 11/15/2017 1155   CL 100 11/15/2017 1155   CO2 28 11/15/2017 1135   GLUCOSE 94 11/15/2017 1155   BUN 12  11/15/2017 1155   CREATININE 0.90 11/15/2017 1155   CREATININE 0.89 10/14/2015 1012   CALCIUM 8.9 11/15/2017 1135   GFRNONAA >60 11/15/2017 1135   GFRNONAA 87 10/14/2015 1012   GFRAA >60 11/15/2017 1135   GFRAA >89 10/14/2015 1012   CrCl cannot be calculated (Patient's most recent lab result is older than the maximum 21 days allowed.).  COAG Lab Results  Component Value Date   INR 0.96 11/15/2017    Radiology VAS US CAROTID  Result Date: 08/31/2021 Carotid Arterial Duplex Study Patient Name:  Albert Hayes  Date of Exam:   08/28/2021 Medical Rec #: [id number]        Accession #:    [id number] Date of Birth: 03/11/1946        Patient Gender: M Patient Age:   52 years Exam Location:  [facility] Procedure:      VAS US CAROTID Referring Phys: Eulogio Ditch --------------------------------------------------------------------------------  Indications:   CVA, Carotid artery disease and right endarterectomy. Other Factors: Hx of left proximal cca dissection. Performing Technologist: Concha Norway RVT  Examination Guidelines: A complete evaluation includes B-mode imaging, spectral Doppler, color Doppler, and power Doppler as needed of all accessible portions of each vessel. Bilateral testing is considered an integral part of a complete examination. Limited examinations for reoccurring indications may be performed as noted.  Right Carotid Findings: +----------+--------+--------+--------+------------------+--------+           PSV cm/sEDV cm/sStenosisPlaque DescriptionComments +----------+--------+--------+--------+------------------+--------+ CCA Prox  57      12                                         +----------+--------+--------+--------+------------------+--------+ CCA Mid   62      14                                         +----------+--------+--------+--------+------------------+--------+ CCA Distal70      15                                          +----------+--------+--------+--------+------------------+--------+  ICA Prox  64      12                                         +----------+--------+--------+--------+------------------+--------+ ICA Mid   76      16                                         +----------+--------+--------+--------+------------------+--------+ ICA Distal54      9                                          +----------+--------+--------+--------+------------------+--------+ ECA       94      7                                          +----------+--------+--------+--------+------------------+--------+ +----------+--------+-------+----------------+-------------------+           PSV cm/sEDV cmsDescribe        Arm Pressure (mmHG) +----------+--------+-------+----------------+-------------------+ [id number]             Multiphasic, WNL                    +----------+--------+-------+----------------+-------------------+ +---------+--------+--+--------+-+---------+ VertebralPSV cm/s24EDV cm/s6Antegrade +---------+--------+--+--------+-+---------+  Left Carotid Findings: +----------+--------+--------+--------+------------------+--------+           PSV cm/sEDV cm/sStenosisPlaque DescriptionComments +----------+--------+--------+--------+------------------+--------+ CCA Prox  75      14                                         +----------+--------+--------+--------+------------------+--------+ CCA Mid   94      16                                         +----------+--------+--------+--------+------------------+--------+ CCA Distal73      20                                         +----------+--------+--------+--------+------------------+--------+ ICA Prox  93      16      1-39%                              +----------+--------+--------+--------+------------------+--------+ ICA Mid   93      25                                          +----------+--------+--------+--------+------------------+--------+ ICA Distal73      19                                         +----------+--------+--------+--------+------------------+--------+ ECA  104     4                                          +----------+--------+--------+--------+------------------+--------+ +----------+--------+--------+----------------+-------------------+           PSV cm/sEDV cm/sDescribe        Arm Pressure (mmHG) +----------+--------+--------+----------------+-------------------+ [id number]              Multiphasic, WNL                    +----------+--------+--------+----------------+-------------------+ +---------+--------+--+--------+-+---------+ VertebralPSV cm/s29EDV cm/s5Antegrade +---------+--------+--+--------+-+---------+   Summary: Right Carotid: Velocities in the right ICA are consistent with a 1-39% stenosis.                Non-hemodynamically significant plaque <50% noted in the CCA. The                ECA appears <50% stenosed. Widely patent ICA s/p CEA. Left Carotid: Velocities in the left ICA are consistent with a 1-39% stenosis.               Non-hemodynamically significant plaque <50% noted in the CCA. The               ECA appears <50% stenosed. Vertebrals:  Bilateral vertebral arteries demonstrate antegrade flow. Subclavians: Normal flow hemodynamics were seen in bilateral subclavian              arteries. *See table(s) above for measurements and observations.  Electronically signed by Hortencia Pilar MD on 08/31/2021 at 7:09:45 PM.    Final      Assessment/Plan 1. Occlusion and stenosis of unspecified carotid artery Recommend:  Given the patient's asymptomatic subcritical stenosis no further invasive testing or surgery at this time.  Duplex ultrasound shows 1-39% stenosis bilaterally.  Continue antiplatelet therapy as prescribed Continue management of CAD, HTN and Hyperlipidemia Healthy heart diet,  encouraged  exercise at least 4 times per week Follow up in 12 months with duplex ultrasound and physical exam   - VAS US CAROTID - VAS US CAROTID; Future  2. Coronary artery disease involving native coronary artery of native heart without angina pectoris Continue cardiac and antihypertensive medications as already ordered and reviewed, no changes at this time.  Continue statin as ordered and reviewed, no changes at this time  Nitrates PRN for chest pain   3. Primary hypertension Continue antihypertensive medications as already ordered, these medications have been reviewed and there are no changes at this time.   4. Chronic obstructive pulmonary disease, unspecified COPD type ([place]) Continue pulmonary medications and aerosols as already ordered, these medications have been reviewed and there are no changes at this time.    5. Hyperlipidemia, unspecified hyperlipidemia type Continue statin as ordered and reviewed, no changes at this time     Hortencia Pilar, MD  09/02/2021 4:06 PM

## 2021-09-05 ENCOUNTER — Other Ambulatory Visit: Payer: Self-pay | Admitting: Family Medicine

## 2021-09-05 DIAGNOSIS — R053 Chronic cough: Secondary | ICD-10-CM

## 2021-09-05 DIAGNOSIS — R634 Abnormal weight loss: Secondary | ICD-10-CM

## 2021-09-06 ENCOUNTER — Ambulatory Visit
Admission: RE | Admit: 2021-09-06 | Discharge: 2021-09-06 | Disposition: A | Discharge status: Home / Self Care | Payer: Medicare Other | Source: Ambulatory Visit | Attending: Family Medicine | Admitting: Family Medicine

## 2021-09-06 DIAGNOSIS — R053 Chronic cough: Secondary | ICD-10-CM

## 2021-09-06 DIAGNOSIS — R634 Abnormal weight loss: Secondary | ICD-10-CM

## 2021-12-20 ENCOUNTER — Other Ambulatory Visit: Payer: Self-pay | Admitting: Family Medicine

## 2021-12-20 DIAGNOSIS — G3184 Mild cognitive impairment, so stated: Secondary | ICD-10-CM

## 2021-12-28 ENCOUNTER — Ambulatory Visit
Admission: RE | Admit: 2021-12-28 | Discharge: 2021-12-28 | Disposition: A | Discharge status: Home / Self Care | Payer: Medicare Other | Source: Ambulatory Visit | Attending: Family Medicine | Admitting: Family Medicine

## 2021-12-28 DIAGNOSIS — G3184 Mild cognitive impairment, so stated: Secondary | ICD-10-CM

## 2022-01-05 ENCOUNTER — Ambulatory Visit: Payer: Medicare Other | Attending: Cardiology | Admitting: Cardiology

## 2022-01-05 ENCOUNTER — Encounter: Payer: Self-pay | Admitting: Cardiology

## 2022-01-05 VITALS — BP 100/50 | HR 55 | Ht 67.0 in | Wt 158.0 lb

## 2022-01-05 DIAGNOSIS — I6521 Occlusion and stenosis of right carotid artery: Secondary | ICD-10-CM | POA: Diagnosis not present

## 2022-01-05 DIAGNOSIS — I10 Essential (primary) hypertension: Secondary | ICD-10-CM

## 2022-01-05 DIAGNOSIS — I2583 Coronary atherosclerosis due to lipid rich plaque: Secondary | ICD-10-CM | POA: Diagnosis not present

## 2022-01-05 DIAGNOSIS — I2510 Atherosclerotic heart disease of native coronary artery without angina pectoris: Secondary | ICD-10-CM | POA: Diagnosis not present

## 2022-01-05 NOTE — Patient Instructions (Signed)
Medication Instructions:  The current medical regimen is effective;  continue present plan and medications.  *If you need a refill on your cardiac medications before your next appointment, please call your pharmacy*  Follow-Up: At [facility], you and your health needs are our priority.  As part of our continuing mission to provide you with exceptional heart care, we have created designated Provider Care Teams.  These Care Teams include your primary Cardiologist (physician) and Advanced Practice Providers (APPs -  Physician Assistants and Nurse Practitioners) who all work together to provide you with the care you need, when you need it.  We recommend signing up for the patient portal called "MyChart".  Sign up information is provided on this After Visit Summary.  MyChart is used to connect with patients for Virtual Visits (Telemedicine).  Patients are able to view lab/test results, encounter notes, upcoming appointments, etc.  Non-urgent messages can be sent to your provider as well.   To learn more about what you can do with MyChart, go to [email or web address].    Your next appointment:   6 month(s)  The format for your next appointment:   In Person  Provider:   Dr Mark Skains     Important Information About Sugar       

## 2022-01-05 NOTE — Progress Notes (Signed)
Cardiology Office Note:    Date:  01/05/2022   ID:  Albert Hayes, DOB [birth year], MRN [id number]  PCP:  System, Provider Not In   [facility] Providers Cardiologist:  None     Referring MD: Teodoro Spray, MD   History of Present Illness:    Albert Hayes is a 76 y.o. male here for the evaluation of CAD, and severe aortic stenosis s/p TAVR, at the request of Dr. Ubaldo Glassing. On 11/21/2021 he was referred to cardiology to see Dr. Marlou Porch per patient request.  He was last seen by Dr. Ubaldo Glassing on 07/18/2021, cardiologist at [facility]. EKG at that visit showed NSR, nonspecific ST and T wave changes. Also has COPD, GERD, prior CABG 12/2014, stroke, hypertension, hyperlipidemia, thrombocytopenia, and type 2 diabetes.  Per Dr. Bethanne Ginger notes, personally reviewed: Carotid dopplers showed less than 50% stenosis bilaterally. He is s/p repair right carotid artery by endarterectomy which was widely patent on CT. 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 Does not appear to require surgical intervention at present. Asymptomatic at present. He was noted to have statin intolerance. On Zetia for arteriosclerosis of coronary artery status post coronary bypass grafting.   Past Surgical Procedures: Includes Angioplasty / stenting femoral (2005, 2006); Coronary artery bypass graft (2005); Septoplasty; Laminotomy Posterior Cervicle/Hemilaminectomy W/Nerve Root Decomp; Coronary Artery Bypass W/Arterial Grafts; egd (2016); transcatheter aortic valve replacement (Left, 09/30/2019).   Coronary artery disease of bypass graft of native heart with stable angina pectoris status post PCI of the vein graft to the OM 2. Patent LIMA to the LAD and vein graft to the RPDA. No evidence of ischemia.  Aortic valve disease-had TAVR with a Medtronic CoreValve placed in June 2021. Was doing well post  procedure. Gradually improving his activity.  Labs 07/11/21: LDL 72, triglycerides 75, A1c 6.5, creatinine 0.8  Today: He is concerned that he has had some more confusion lately. Recently had a brain MRI last week. Currently on Aricept.  He confirms statin intolerance to many statins. Previously he was sent to the ED due to intolerance of two different statins.  In 2020 he had a stroke. He subsequently underwent endarterectomy of the right carotid. Every year he returns for updated ultrasounds.   He is a former smoker, a long time ago.  He denies any palpitations, chest pain, shortness of breath, or peripheral edema. No lightheadedness, headaches, syncope, orthopnea, or PND.  Extensive review of medical records, data.  Past Medical History:  Diagnosis Date   Arteriosclerosis of coronary artery 12/22/2014   Asthma    Benign prostatic hyperplasia without lower urinary tract symptoms 09/13/2012   Cervical spondylosis 02/27/2013   Chronic idiopathic constipation 11/02/2015   Chronic obstructive pulmonary disease 12/22/2014   Coronary artery disease    DDD (degenerative disc disease), cervical 02/27/2013   DDD (degenerative disc disease), lumbosacral 02/27/2013   Diabetes mellitus without complication    Dysphagia 10/18/2016   ED (erectile dysfunction)    Generalized anxiety disorder 01/21/2013   GERD (gastroesophageal reflux disease)    Heart murmur    EJECTION MURMUR   HLD (hyperlipidemia)    Hx of colon polyps    Hx of coronary artery bypass surgery 12/22/2014   Hypertension    Low back pain    Lumbosacral spondylosis 02/27/2013   Mild neurocognitive disorder 04/28/2019   Post-traumatic stress disorder, unspecified 02/27/2013  Sleep apnea    Status post coronary artery stent placement 10/30/2017   SVG to OM and L main.   Stenosis of carotid artery 11/20/2017   Added automatically from request for surgery [id number]   Stroke (cerebrum) ([place]) 11/15/2017   Thrombocytopenia ([place])     Vitamin D deficiency     Past Surgical History:  Procedure Laterality Date   COLONOSCOPY     COLONOSCOPY WITH PROPOFOL N/A 10/31/2016   Procedure: COLONOSCOPY WITH PROPOFOL;  Surgeon: Manya Silvas, MD;  Location: [facility] ENDOSCOPY;  Service: Endoscopy;  Laterality: N/A;   CORONARY ANGIOPLASTY     CORONARY ARTERY BYPASS GRAFT     ESOPHAGOGASTRODUODENOSCOPY  2016   SEPTOPLASTY     spine/bone surgery     TONSILLECTOMY      Current Medications: Current Meds  Medication Sig   albuterol (VENTOLIN HFA) 108 (90 Base) MCG/ACT inhaler TAKE 1 PUFF (INHALATION) EVERY 6 HOURS (AS NEEDED FOR SHORTNESS OF BREATH OR WHEEZING) FOR 10 DAYS   amLODipine (NORVASC) 10 MG tablet Take by mouth.   aspirin EC 81 MG tablet Take 81 mg by mouth daily.   azithromycin (ZITHROMAX) 250 MG tablet Take 250 mg by mouth daily.   carvedilol (COREG) 3.125 MG tablet Take 3.125 mg by mouth 2 (two) times daily.   clindamycin (CLEOCIN) 150 MG capsule Take 150 mg by mouth 2 (two) times daily. Dental Procedures   clopidogrel (PLAVIX) 75 MG tablet Take 75 mg by mouth daily.   donepezil (ARICEPT) 5 MG tablet Take 1 tablet by mouth at bedtime.   ezetimibe (ZETIA) 10 MG tablet Take 10 mg by mouth daily.   ramipril (ALTACE) 2.5 MG capsule Take 2.5 mg by mouth daily.     Allergies:   Penicillins, Xanax [alprazolam], Atorvastatin, Bupropion, Citalopram, Cymbalta [duloxetine hcl], Duloxetine, Fluoxetine, Fluvoxamine, Lexapro [escitalopram oxalate], Niacin and related, Oxcarbazepine, Pravastatin, Quetiapine, Seroquel [quetiapine fumarate], Statins, Tamsulosin, and Zoloft [sertraline hcl]   Social History   Socioeconomic History   Marital status: Married    Spouse name: Vaughan Basta   Number of children: 0   Years of education: 16   Highest education level: Not on file  Occupational History   Occupation: Retired Scientist, clinical (histocompatibility and immunogenetics)  Tobacco Use   Smoking status: Former    Types: Cigarettes    Quit date: 10/30/1988    Years since  quitting: 33.2   Smokeless tobacco: Never  Vaping Use   Vaping Use: Never used  Substance and Sexual Activity   Alcohol use: No    Alcohol/week: 0.0 standard drinks of alcohol   Drug use: No   Sexual activity: Not on file  Other Topics Concern   Not on file  Social History Narrative   Married. Education: high school/other. Exercise: Yes.      Patient is right-handed.   One story house   Social Determinants of Health   Financial Resource Strain: Not on file  Food Insecurity: Not on file  Transportation Needs: Not on file  Physical Activity: Not on file  Stress: Not on file  Social Connections: Not on file     Family History: The patient's family history includes Alzheimer's disease in his paternal grandfather; Cancer in his father; Headache in his mother; Parkinson's disease in his paternal grandmother.  ROS:   Please see the history of present illness.    All other systems reviewed and are negative.  EKGs/Labs/Other Studies Reviewed:    The following studies were reviewed today:  CT Chest  09/06/2021: IMPRESSION: 1.  Small foci of ground-glass opacity in the medial left lower lobe, which may reflect residual infection or inflammation. 2. No other evidence of pneumonia or of an acute abnormality in the chest. 3. Changes from previous cardiac surgery and TAVR with coronary artery calcifications. Aortic atherosclerosis.   Aortic Atherosclerosis (ICD10-I70.0).  CTA Neck 01/25/2021: IMPRESSION: 1. Sequela of dissection in the proximal left common carotid with nonocclusive flap. 2. Treated right ICA stenosis since 2019 without recurrence. 3. 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  Bilateral Carotid Doppler  08/28/2021: Summary:  Right Carotid: Velocities in the right ICA are consistent with a 1-39%  stenosis.                 Non-hemodynamically significant plaque <50% noted in the  CCA. The                  ECA appears <50% stenosed. Widely patent ICA s/p CEA.   Left Carotid: Velocities in the left ICA are consistent with a 1-39%  stenosis.                Non-hemodynamically significant plaque <50% noted in the  CCA. The                ECA appears <50% stenosed.   Vertebrals:  Bilateral vertebral arteries demonstrate antegrade flow.  Subclavians: Normal flow hemodynamics were seen in bilateral subclavian               arteries.   EKG:  EKG is personally reviewed and interpreted. 01/05/2022: Sinus sinus bradycardia 55.   Recent Labs: No results found for requested labs within last 365 days.   Recent Lipid Panel    Component Value Date/Time   CHOL 173 11/16/2017 0517   TRIG 131 11/16/2017 0517   HDL 32 (L) 11/16/2017 0517   CHOLHDL 5.4 11/16/2017 0517   VLDL 26 11/16/2017 0517   LDLCALC 115 (H) 11/16/2017 0517     Risk Assessment/Calculations:          Physical Exam:    VS:  BP (!) 100/50 (BP Location: Left Arm, Patient Position: Sitting, Cuff Size: Normal)   Pulse (!) 55   Ht '5\' 7"'$  (1.702 m)   Wt 158 lb (71.7 kg)   SpO2 95%   BMI 24.75 kg/m     Wt Readings from Last 3 Encounters:  01/05/22 158 lb (71.7 kg)  08/28/21 154 lb (69.9 kg)  05/19/19 168 lb (76.2 kg)     GEN: Well nourished, well developed in no acute distress HEENT: Normal NECK: No JVD; left-sided carotid bruit, right carotid endarterectomy scar LYMPHATICS: No lymphadenopathy CARDIAC: RRR, no murmurs, rubs, gallops RESPIRATORY:  Clear to auscultation without rales, wheezing or rhonchi  ABDOMEN: Soft, non-tender, non-distended MUSCULOSKELETAL:  No edema; No deformity  SKIN: Warm and dry NEUROLOGIC:  Alert and oriented x 3 PSYCHIATRIC:  Normal affect   ASSESSMENT:    1. Coronary artery disease due to lipid rich plaque   2. Primary hypertension   3. Stenosis of right carotid artery    PLAN:    In order of problems listed above:  Coronary artery disease post CABG - Anatomy reviewed.   Currently stable without any anginal symptoms.  Continuing with both aspirin and Plavix (Plavix secondary to peripheral arterial disease, carotid artery disease)  Aortic valve/TAVR -Inserted via the left carotid artery.  Intimal flap dissection noted.  Monitored.  Seen by vascular.  Dr. Delana Meyer.  Memory impairment -  Has upcoming appointment with neurologist.  He states that he is now on a medication for dementia.  Hypertension - Currently on Norvasc 10 mg carvedilol 3.125 mg Altace 2.5 mg.  Well-controlled.  Hyperlipidemia with statin intolerance - States that he had a seizure previously with another statin.  Grabbed his wife's hand and would not let go in the emergency room. - Currently on both Zetia as well as Livalo.  LDL goal less than 55.       Medication Adjustments/Labs and Tests Ordered: Current medicines are reviewed at length with the patient today.  Concerns regarding medicines are outlined above.   Orders Placed This Encounter  Procedures   EKG 12-Lead   No orders of the defined types were placed in this encounter.  Patient Instructions  Medication Instructions:  The current medical regimen is effective;  continue present plan and medications.  *If you need a refill on your cardiac medications before your next appointment, please call your pharmacy*  Follow-Up: At [facility], you and your health needs are our priority.  As part of our continuing mission to provide you with exceptional heart care, we have created designated Provider Care Teams.  These Care Teams include your primary Cardiologist (physician) and Advanced Practice Providers (APPs -  Physician Assistants and Nurse Practitioners) who all work together to provide you with the care you need, when you need it.  We recommend signing up for the patient portal called "MyChart".  Sign up information is provided on this After Visit Summary.  MyChart is used to connect with patients for Virtual Visits  (Telemedicine).  Patients are able to view lab/test results, encounter notes, upcoming appointments, etc.  Non-urgent messages can be sent to your provider as well.   To learn more about what you can do with MyChart, go to [email or web address].    Your next appointment:   6 month(s)  The format for your next appointment:   In Person  Provider:   Dr Candee Furbish      Important Information About Sugar         I,Mathew Stumpf,acting as a scribe for Candee Furbish, MD.,have documented all relevant documentation on the behalf of Candee Furbish, MD,as directed by  Candee Furbish, MD while in the presence of Candee Furbish, MD.  I, Candee Furbish, MD, have reviewed all documentation for this visit. The documentation on 01/05/22 for the exam, diagnosis, procedures, and orders are all accurate and complete.   Signed, Candee Furbish, MD  01/05/2022 11:34 AM    [facility]

## 2022-01-06 ENCOUNTER — Encounter: Payer: Self-pay | Admitting: Cardiology

## 2022-01-06 DIAGNOSIS — M79604 Pain in right leg: Secondary | ICD-10-CM

## 2022-01-24 ENCOUNTER — Ambulatory Visit (HOSPITAL_COMMUNITY)
Admission: RE | Admit: 2022-01-24 | Discharge: 2022-01-24 | Disposition: A | Discharge status: Home / Self Care | Payer: Medicare Other | Source: Ambulatory Visit | Attending: Cardiology | Admitting: Cardiology

## 2022-01-24 ENCOUNTER — Other Ambulatory Visit: Payer: Self-pay | Admitting: Cardiology

## 2022-01-24 DIAGNOSIS — I350 Nonrheumatic aortic (valve) stenosis: Secondary | ICD-10-CM | POA: Diagnosis not present

## 2022-01-24 DIAGNOSIS — M79604 Pain in right leg: Secondary | ICD-10-CM | POA: Insufficient documentation

## 2022-01-24 DIAGNOSIS — M79605 Pain in left leg: Secondary | ICD-10-CM | POA: Insufficient documentation

## 2022-01-24 DIAGNOSIS — I739 Peripheral vascular disease, unspecified: Secondary | ICD-10-CM

## 2022-02-05 ENCOUNTER — Encounter: Payer: Self-pay | Admitting: Cardiology

## 2022-02-05 ENCOUNTER — Encounter (INDEPENDENT_AMBULATORY_CARE_PROVIDER_SITE_OTHER): Payer: Self-pay

## 2022-03-09 ENCOUNTER — Other Ambulatory Visit: Payer: Self-pay | Admitting: Cardiology

## 2022-03-27 ENCOUNTER — Other Ambulatory Visit: Payer: Self-pay | Admitting: Cardiology

## 2022-05-02 ENCOUNTER — Encounter: Payer: Self-pay | Admitting: Cardiology

## 2022-05-15 ENCOUNTER — Telehealth: Payer: Self-pay | Admitting: Pharmacist

## 2022-05-15 ENCOUNTER — Ambulatory Visit: Payer: Medicare Other | Attending: Cardiovascular Disease | Admitting: Pharmacist

## 2022-05-15 DIAGNOSIS — E785 Hyperlipidemia, unspecified: Secondary | ICD-10-CM

## 2022-05-15 NOTE — Assessment & Plan Note (Signed)
Assessment: Patient currently on Livalo and ezetimibe but insurance will no longer cover Livalo Intolerance to multiple statins, 2 of which sent him to the ER Having some issues with dementia Reviewed medication options such as Nexletol and Repatha Patient not comfortable with injection but wife can give him and he is okay with that  Plan: Will submit prior authorization for Repatha Continue ezetimibe 10 mg daily Stop Livalo once he runs out, states he has about 4 tablets left Repeat lipids same day as appointment with Sharyn Lull in March

## 2022-05-15 NOTE — Progress Notes (Unsigned)
Patient ID: Albert Hayes                 DOB: Jun 08, 1945                    MRN: [id number]      HPI: Albert Hayes is a 77 y.o. male patient referred to lipid clinic by Dr. Marlou Porch. PMH is significant for CAD s/p CABG 2016, severe aortic stenosis s/p TAVR, COPD, GERD, HTN, HLD, DM2, CVA s/p endarterectomy of the right carotid .   Patient with previous reactions to various statins.  Reports being unable to move with 1 statin.  Has been on pravastatin and atorvastatin and he thinks rosuvastatin.  Currently on Livalo but his insurance will no longer cover it.  Here to discuss alternative options.   Reviewed options for lowering LDL cholesterol, including ezetimibe, PCSK-9 inhibitors, bempedoic acid and inclisiran.  Discussed mechanisms of action, dosing, side effects and potential decreases in LDL cholesterol.  Also reviewed cost information and potential options for patient assistance.   Current Medications: Livalo, ezetimibe '10mg'$  daily Intolerances: Livalo (insurance stopped covering), pravastatin, atorvastatin, Risk Factors: CAD s/p CABG, Stroke LDL-C goal: <55 ApoB goal: <80  Family History: Breast Cancer-relatedfamily history is not on file.   Social History:  Social History   Socioeconomic History   Marital status: Married    Spouse name: Vaughan Basta   Number of children: 0   Years of education: 16   Highest education level: Not on file  Occupational History   Occupation: Retired Scientist, clinical (histocompatibility and immunogenetics)  Tobacco Use   Smoking status: Former    Types: Cigarettes    Quit date: 10/30/1988    Years since quitting: 33.5   Smokeless tobacco: Never  Vaping Use   Vaping Use: Never used  Substance and Sexual Activity   Alcohol use: No    Alcohol/week: 0.0 standard drinks of alcohol   Drug use: No   Sexual activity: Not on file  Other Topics Concern   Not on file  Social History Narrative   Married. Education: high school/other. Exercise: Yes.      Patient is right-handed.   One  story house   Social Determinants of Health   Financial Resource Strain: Not on file  Food Insecurity: Not on file  Transportation Needs: Not on file  Physical Activity: Not on file  Stress: Not on file  Social Connections: Not on file  Intimate Partner Violence: Not on file     Labs: Lipid Panel  07/11/21 Cholesterol, Total 100 - 200 mg/dL 127  Triglyceride 35 - 199 mg/dL 75  HDL (High Density Lipoprotein) Cholesterol 29.0 - 71.0 mg/dL 40.1  LDL Calculated 0 - 130 mg/dL 72  VLDL Cholesterol mg/dL 15  Cholesterol/HDL Ratio  3.2      Component Value Date/Time   CHOL 173 11/16/2017 0517   TRIG 131 11/16/2017 0517   HDL 32 (L) 11/16/2017 0517   CHOLHDL 5.4 11/16/2017 0517   VLDL 26 11/16/2017 0517   LDLCALC 115 (H) 11/16/2017 0517    Past Medical History:  Diagnosis Date   Arteriosclerosis of coronary artery 12/22/2014   Asthma    Benign prostatic hyperplasia without lower urinary tract symptoms 09/13/2012   Cervical spondylosis 02/27/2013   Chronic idiopathic constipation 11/02/2015   Chronic obstructive pulmonary disease 12/22/2014   Coronary artery disease    DDD (degenerative disc disease), cervical 02/27/2013   DDD (degenerative disc disease), lumbosacral 02/27/2013   Diabetes mellitus without complication  Dysphagia 10/18/2016   ED (erectile dysfunction)    Generalized anxiety disorder 01/21/2013   GERD (gastroesophageal reflux disease)    Heart murmur    EJECTION MURMUR   HLD (hyperlipidemia)    Hx of colon polyps    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   Stenosis of carotid artery 11/20/2017   Added automatically from request for surgery [id number]   Stroke (cerebrum) ([place]) 11/15/2017    Thrombocytopenia ([facility])    Vitamin D deficiency     Current Outpatient Medications on File Prior to Visit  Medication Sig Dispense Refill   albuterol (VENTOLIN HFA) 108 (90 Base) MCG/ACT inhaler TAKE 1 PUFF (INHALATION) EVERY 6 HOURS (AS NEEDED FOR SHORTNESS OF BREATH OR WHEEZING) FOR 10 DAYS     amLODipine (NORVASC) 10 MG tablet Take by mouth.     aspirin EC 81 MG tablet Take 81 mg by mouth daily.     azithromycin (ZITHROMAX) 250 MG tablet Take 250 mg by mouth daily.     carvedilol (COREG) 3.125 MG tablet Take 1 tablet (3.125 mg total) by mouth 2 (two) times daily with a meal. 180 tablet 2   clindamycin (CLEOCIN) 150 MG capsule Take 150 mg by mouth 2 (two) times daily. Dental Procedures     clopidogrel (PLAVIX) 75 MG tablet Take 75 mg by mouth daily.     donepezil (ARICEPT) 5 MG tablet Take 1 tablet by mouth at bedtime.     ezetimibe (ZETIA) 10 MG tablet TAKE 1 TABLET BY MOUTH DAILY 90 tablet 2   ramipril (ALTACE) 2.5 MG capsule Take 2.5 mg by mouth daily.     No current facility-administered medications on file prior to visit.    Allergies  Allergen Reactions   Penicillins Other (See Comments)    Unknown childhood allergic reaction - severe Has patient had a PCN reaction causing immediate rash, facial/tongue/throat swelling, SOB or lightheadedness with hypotension: Unknown Has patient had a PCN reaction causing severe rash involving mucus membranes or skin necrosis: Unknown Has patient had a PCN reaction that required hospitalization: Yes Has patient had a PCN reaction occurring within the last 10 years: No If all of the above answers are "NO", then may proceed with Cephalosporin use.   Xanax [Alprazolam] Shortness Of Breath   Atorvastatin Other (See Comments)    Difficulty breathing    Bupropion Other (See Comments)    Unknown reaction   Citalopram Other (See Comments)    Unknown reaction   Cymbalta [Duloxetine Hcl] Other (See Comments)    Unknown reaction   Duloxetine Other  (See Comments)   Fluoxetine Other (See Comments)    Unknown reaction   Fluvoxamine Other (See Comments)    Unknown reaction   Lexapro [Escitalopram Oxalate] Other (See Comments)    Unknown reaction   Niacin And Related Other (See Comments)    Unknown reaction   Oxcarbazepine Other (See Comments)    Unknown reaction   Pravastatin Other (See Comments)    Unknown reaction   Quetiapine Other (See Comments)   Seroquel [Quetiapine Fumarate] Other (See Comments)    Unknown reaction   Statins Other (See Comments)    Unknown adverse reaction   Tamsulosin Other (See  Comments)    Unknown reaction   Zoloft [Sertraline Hcl] Other (See Comments)    Unknown reaction    Assessment/Plan:  1. Hyperlipidemia -  HLD (hyperlipidemia) Assessment: Patient currently on Livalo and ezetimibe but insurance will no longer cover Livalo Intolerance to multiple statins, 2 of which sent him to the ER Having some issues with dementia Reviewed medication options such as Nexletol and Repatha Patient not comfortable with injection but wife can give him and he is okay with that  Plan: Will submit prior authorization for Repatha Continue ezetimibe 10 mg daily Stop Livalo once he runs out, states he has about 4 tablets left Repeat lipids same day as appointment with Sharyn Lull in March   Thank you,  Ramond Dial, Pharm.D, BCPS, CPP [facility] of [facility] [address], [place],  [address]  Phone: [phone]; Fax: [phone]

## 2022-05-15 NOTE — Patient Instructions (Addendum)
I will submit a prior authorization for Repatha. I will call you once I hear back. Please call me at [phone] with any questions.   Repatha is a cholesterol medication that improved your body's ability to get rid of "bad cholesterol" known as LDL. It can lower your LDL up to 60%! It is an injection that is given under the skin every 2 weeks. The medication often requires a prior authorization from your insurance company. We will take care of submitting all the necessary information to your insurance company to get it approved. The most common side effects of Repatha include runny nose, symptoms of the common cold, rarely flu or flu-like symptoms, back/muscle pain in about 3-4% of the patients, and redness, pain, or bruising at the injection site. Tell your healthcare provider if you have any side effect that bothers you or that does not go away.   Please call me at [phone] with any questions     

## 2022-05-15 NOTE — Telephone Encounter (Signed)
Repatha PA started [id number]

## 2022-05-16 ENCOUNTER — Encounter: Payer: Self-pay | Admitting: Cardiology

## 2022-05-16 ENCOUNTER — Encounter: Payer: Self-pay | Admitting: Pharmacist

## 2022-05-16 MED ORDER — REPATHA SURECLICK 140 MG/ML ~~LOC~~ SOAJ: 1.0000 mL | SUBCUTANEOUS | 11 refills | Status: DC

## 2022-05-16 NOTE — Telephone Encounter (Signed)
PA approved through 11/13/22. Rx sent to [facility]. Labs already scheduled for 3/29. Mychart message sent to patient.

## 2022-05-17 MED ORDER — CARVEDILOL 6.25 MG PO TABS: 6.2500 mg | ORAL_TABLET | Freq: Two times a day (BID) | ORAL | 3 refills | Status: DC

## 2022-06-01 ENCOUNTER — Encounter: Payer: Self-pay | Admitting: Physician Assistant

## 2022-06-01 ENCOUNTER — Ambulatory Visit: Payer: Medicare Other | Admitting: Physician Assistant

## 2022-06-01 ENCOUNTER — Ambulatory Visit: Payer: Medicare Other

## 2022-06-01 VITALS — BP 116/61 | HR 53 | Resp 18 | Ht 67.0 in | Wt 164.0 lb

## 2022-06-01 DIAGNOSIS — G3184 Mild cognitive impairment, so stated: Secondary | ICD-10-CM

## 2022-06-01 MED ORDER — MEMANTINE HCL 5 MG PO TABS: 5.0000 mg | ORAL_TABLET | Freq: Every evening | ORAL | 11 refills | Status: DC

## 2022-06-01 MED ORDER — DONEPEZIL HCL 5 MG PO TABS: 5.0000 mg | ORAL_TABLET | Freq: Every day | ORAL | 11 refills | Status: DC

## 2022-06-01 MED ORDER — DONEPEZIL HCL 10 MG PO TABS: 10.0000 mg | ORAL_TABLET | Freq: Every day | ORAL | 1 refills | Status: DC

## 2022-06-01 NOTE — Progress Notes (Addendum)
Assessment/Plan:   Albert Hayes is a very pleasant 77 y.o. year old RH male with a history of CAD, and severe aortic stenosis s/p TAVR, hypertension, hyperlipidemia, COPD, history of CVA in 2019, carotid artery disease  s/p repair, DDD, vitamin D deficiency,  anxiety, multiple allergies to medications, seen today for evaluation of memory loss.  Patient had been seen in February 2021 at our office after his stroke.  He has had on April 27, 2019 and neuropsychological evaluation yielding a diagnosis of mild neurocognitive disorder with deficits in attention and concentration, executive functioning and verbal fluency, however multiple etiologies were possible old though vascular etiology was most likely contributing to it.  Also considered at the time were undetected seizure activity or frontotemporal dementia. MRI of the brain 12/27/2021, personally reviewed remarkable for mild chronic small vessel ischemic changes within the cerebral white matter and pons similar to prior MRI in 2021, subcentimeter chronic infarct within the left cerebellar hemisphere, new since prior MRI in 2021, mild generalized cerebral atrophy, no acute findings.  MoCA today is  26/30. 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 Patient is on donepezil 5 mg daily since September 2023. He is bradycardic today therefore will not increase his donepezil. Will add memantine 5 mg at night to the regimen.       Mild neurocognitive disorder    Neurocognitive testing to further evaluate cognitive concerns and determine other underlying cause of memory changes, including potential contribution from sleep, anxiety, or depression  Continue donepezil 5 mg daily as per PCP, side effects discussed Start memantine 5 mg at night, side effects discussed  Continue tight control of cardiovascular risk factors, follow with cardiology.  Continue Plavix and aspirin for secondary stroke prevention Continue to control mood as per PCP   Folllow up in 6  months  Subjective:   The patient is here alone   How long did patient have memory difficulties? Around July 2021 became evident.  He has trouble expressing himself, sometimes he scrambles his thoughts.  He needs to use a calendar and keep notes to remember things such as going to the grocery store. As a retired Scientist, clinical (histocompatibility and immunogenetics), sometimes he has trouble performing activities in the computer. He is now on donepezil the he feels originally it was helping, "but it is starting to decrease again". " I could not remember going to the building, the issues are coming back " repeats oneself? Denies Disoriented when walking into a room?  Patient denies   Leaving objects in unusual places? denies ." I am very organized "  Wandering behavior?  denies   Any personality changes ?  Patient denies  Any worsening depression?:  Patient denies   Hallucinations or paranoia?  Patient denies   Seizures?   Patient denies    Any sleep changes?  Sleeps well. Denies vivid dreams, REM behavior or sleepwalking   Sleep apnea? He denies diagnosis at present time   Any hygiene concerns?  Patient denies   Independent of bathing and dressing?  Endorsed  Does the patient needs help with medications? Patient  is in charge  Who is in charge of the finances?  Wife is in charge (she has always handled it)    Any changes in appetite?   denies    Patient have trouble swallowing?   denies   Does the patient cook? Yes    Any kitchen accidents such as leaving the stove on? Patient denies   Any headaches?   denies  Chronic back pain  denies   Ambulates with difficulty?    denies   Recent falls or head injuries?   denies     Unilateral weakness, numbness or tingling?  denies   Any tremors?   denies   Any anosmia? "I cannot answer that question, some days is different" Any incontinence of urine? denies   Any bowel dysfunction?    denies      Patient lives  wife History of heavy alcohol intake? denies   History of heavy  tobacco use?  denies   Family history of dementia?  Endorsed, in maternal grandfather.  Grandmother had Parkinson's disease. Does patient drive? Yes, no issues   Pertinent labs October and November 2023 B12 619, A1c 6.2, folate 15.4, TSH September 2023 4.6,  MRI of the brain 12/27/2021, personally reviewed remarkable for mild chronic small vessel ischemic changes within the cerebral white matter and pons similar to prior MRI in 2021, subcentimeter chronic infarct within the left cerebellar hemisphere, new since prior MRI in 2021, mild generalized cerebral atrophy, no acute findings.   04/27/2019 NEUROPSYCHOLOGICAL EVALUATION:  Mild neurocognitive disorder.  He exhibited deficits in attention/concentration, executive functioning and verbal fluency.  Multiple etiologies are possible.  Vascular etiology most likely but also considered were undetected seizure activity or frontotemporal dementia.    Past Medical History:  Diagnosis Date   Arteriosclerosis of coronary artery 12/22/2014   Asthma    Benign prostatic hyperplasia without lower urinary tract symptoms 09/13/2012   Cervical spondylosis 02/27/2013   Chronic idiopathic constipation 11/02/2015   Chronic obstructive pulmonary disease 12/22/2014   Coronary artery disease    DDD (degenerative disc disease), cervical 02/27/2013   DDD (degenerative disc disease), lumbosacral 02/27/2013   Diabetes mellitus without complication    Dysphagia 10/18/2016   ED (erectile dysfunction)    Generalized anxiety disorder 01/21/2013   GERD (gastroesophageal reflux disease)    Heart murmur    EJECTION MURMUR   HLD (hyperlipidemia)    Hx of colon polyps    Hx of coronary artery bypass surgery 12/22/2014   Hypertension    Low back pain    Lumbosacral spondylosis 02/27/2013   Mild neurocognitive disorder 04/28/2019   Post-traumatic stress disorder, unspecified 02/27/2013   Sleep apnea    Status post coronary artery stent placement 10/30/2017   SVG to OM  and L main.   Stenosis of carotid artery 11/20/2017   Added automatically from request for surgery [id number]   Stroke (cerebrum) ([place]) 11/15/2017   Thrombocytopenia ([place])    Vitamin D deficiency      Past Surgical History:  Procedure Laterality Date   COLONOSCOPY     COLONOSCOPY WITH PROPOFOL N/A 10/31/2016   Procedure: COLONOSCOPY WITH PROPOFOL;  Surgeon: Manya Silvas, MD;  [facility]COPY;  Service: Endoscopy;  Laterality: N/A;   CORONARY ANGIOPLASTY     CORONARY ARTERY BYPASS GRAFT     ESOPHAGOGASTRODUODENOSCOPY  2016   SEPTOPLASTY     spine/bone surgery     TONSILLECTOMY       Allergies  Allergen Reactions   Penicillins Other (See Comments)    Unknown childhood allergic reaction - severe Has patient had a PCN reaction causing immediate rash, facial/tongue/throat swelling, SOB or lightheadedness with hypotension: Unknown Has patient had a PCN reaction causing severe rash involving mucus membranes or skin necrosis: Unknown Has patient had a PCN reaction that required hospitalization: Yes Has patient had a PCN reaction occurring within the last 10 years: No If all  of the above answers are "NO", then may proceed with Cephalosporin use.   Xanax [Alprazolam] Shortness Of Breath   Atorvastatin Other (See Comments)    Difficulty breathing    Bupropion Other (See Comments)    Unknown reaction   Citalopram Other (See Comments)    Unknown reaction   Cymbalta [Duloxetine Hcl] Other (See Comments)    Unknown reaction   Duloxetine Other (See Comments)   Fluoxetine Other (See Comments)    Unknown reaction   Fluvoxamine Other (See Comments)    Unknown reaction   Lexapro [Escitalopram Oxalate] Other (See Comments)    Unknown reaction   Niacin And Related Other (See Comments)    Unknown reaction   Oxcarbazepine Other (See Comments)    Unknown reaction   Pravastatin Other (See Comments)    Unknown reaction   Quetiapine Other (See Comments)   Seroquel [Quetiapine Fumarate]  Other (See Comments)    Unknown reaction   Statins Other (See Comments)    Unknown adverse reaction   Tamsulosin Other (See Comments)    Unknown reaction   Zoloft [Sertraline Hcl] Other (See Comments)    Unknown reaction    Current Outpatient Medications  Medication Instructions   albuterol (VENTOLIN HFA) 108 (90 Base) MCG/ACT inhaler TAKE 1 PUFF (INHALATION) EVERY 6 HOURS (AS NEEDED FOR SHORTNESS OF BREATH OR WHEEZING) FOR 10 DAYS   amLODipine (NORVASC) 10 MG tablet Oral   aspirin EC 81 mg, Oral, Daily   azithromycin (ZITHROMAX) 250 mg, Daily   carvedilol (COREG) 6.25 mg, Oral, 2 times daily   clindamycin (CLEOCIN) 150 mg, 2 times daily   clopidogrel (PLAVIX) 75 mg, Oral, Daily   donepezil (ARICEPT) 5 mg, Oral, Daily at bedtime   ezetimibe (ZETIA) 10 mg, Oral, Daily   LIVALO 2 MG TABS 1 tablet, Daily   MAGNESIUM PO Oral   memantine (NAMENDA) 5 mg, Oral, Nightly   ramipril (ALTACE) 2.5 mg, Oral, Daily   Repatha SureClick [id number] mg, Subcutaneous, Every 14 days     VITALS:   Vitals:   06/01/22 0739  BP: 116/61  Pulse: (!) 53  Resp: 18  SpO2: 95%  Weight: 164 lb (74.4 kg)  Height: '5\' 7"'$  (1.702 m)      10/14/2015    8:59 AM 05/25/2015    1:16 PM 02/14/2015    1:52 PM  Depression screen PHQ 2/9  Decreased Interest 0 0 0  Down, Depressed, Hopeless 0 0 0  PHQ - 2 Score 0 0 0    PHYSICAL EXAM   HEENT:  Normocephalic, atraumatic. The mucous membranes are moist. The superficial temporal arteries are without ropiness or tenderness. Cardiovascular: Regular rate and rhythm. Lungs: Clear to auscultation bilaterally. Neck: There are no carotid bruits noted bilaterally.  NEUROLOGICAL:    06/01/2022   10:00 AM  Montreal Cognitive Assessment   Visuospatial/ Executive (0/5) 4  Naming (0/3) 3  Attention: Read list of digits (0/2) 2  Attention: Read list of letters (0/1) 1  Attention: Serial 7 subtraction starting at 100 (0/3) 3  Language: Repeat phrase (0/2) 1  Language :  Fluency (0/1) 1  Abstraction (0/2) 1  Delayed Recall (0/5) 4  Orientation (0/6) 6  Total 26  Adjusted Score (based on education) 26        No data to display           Orientation:  Alert and oriented to person, place and time. No aphasia or dysarthria. Fund of knowledge is appropriate. Recent memory  impaired and remote memory intact.  Attention and concentration are reduced.  Able to name objects and repeat phrases. Delayed recall   4/5  Cranial nerves: There is good facial symmetry. Extraocular muscles are intact and visual fields are full to confrontational testing. Speech is fluent and clear. No tongue deviation. Hearing is intact to conversational tone. Tone: Tone is good throughout. Sensation: Sensation is intact to light touch and pinprick throughout. Vibration is intact at the bilateral big toe.There is no extinction with double simultaneous stimulation. There is no sensory dermatomal level identified. Coordination: The patient has no difficulty with RAM's or FNF bilaterally. Normal finger to nose  Motor: Strength is 5/5 in the bilateral upper and lower extremities. There is no pronator drift. There are no fasciculations noted. DTR's: Deep tendon reflexes are 2/4 at the bilateral biceps, triceps, brachioradialis, patella and achilles.  Plantar responses are downgoing bilaterally. Gait and Station: The patient is able to ambulate without difficulty.The patient is able to heel toe walk without any difficulty.The patient is able to ambulate in a tandem fashion. The patient is able to stand in the Romberg position.     Thank you for allowing [place] the opportunity to participate in the care of this nice patient. Please do not hesitate to contact us for any questions or concerns.   Total time spent on today's visit was 62 minutes dedicated to this patient today, preparing to see patient, examining the patient, ordering tests and/or medications and counseling the patient, documenting  clinical information in the EHR or other health record, independently interpreting results and communicating results to the patient/family, discussing treatment and goals, answering patient's questions and coordinating care.  Cc:  System, Provider Not In  Sharene Butters 06/01/2022 10:48 AM

## 2022-06-01 NOTE — Patient Instructions (Addendum)
It was a pleasure to see you today at our office.   Recommendations:  Neurocognitive evaluation at our office Continue  donepezil to 5 mg a day, follow up with PCP on the medicine  Start memantine 5 mg nightly, side effects discussed   Follow up in 6 months   Whom to call:  Memory  decline, memory medications: Call our office [phone]   For psychiatric meds, mood meds: Please have your primary care physician manage these medications.     For assessment of decision of mental capacity and competency:  Call Dr. Anthoney Harada, geriatric psychiatrist at [phone]  For guidance in geriatric dementia issues please call [facility] [phone]   If you have any severe symptoms of a stroke, or other severe issues such as confusion,severe chills or fever, etc call 911 or go to the ER as you may need to be evaluated further   Feel free to visit Facebook page " [email or web address]" for tips of how to care for people with memory problems.       RECOMMENDATIONS FOR ALL PATIENTS WITH MEMORY PROBLEMS: 1. Continue to exercise (Recommend 30 minutes of walking everyday, or 3 hours every week) 2. Increase social interactions - continue going to [place] and enjoy social gatherings with friends and family 3. Eat healthy, avoid fried foods and eat more fruits and vegetables 4. Maintain adequate blood pressure, blood sugar, and blood cholesterol level. Reducing the risk of stroke and cardiovascular disease also helps promoting better memory. 5. Avoid stressful situations. Live a simple life and avoid aggravations. Organize your time and prepare for the next day in anticipation. 6. Sleep well, avoid any interruptions of sleep and avoid any distractions in the bedroom that may interfere with adequate sleep quality 7. Avoid sugar, avoid sweets as there is a strong link between excessive sugar intake, diabetes, and cognitive impairment We discussed the Mediterranean diet, which has been shown to help  patients reduce the risk of progressive memory disorders and reduces cardiovascular risk. This includes eating fish, eat fruits and green leafy vegetables, nuts like almonds and hazelnuts, walnuts, and also use olive oil. Avoid fast foods and fried foods as much as possible. Avoid sweets and sugar as sugar use has been linked to worsening of memory function.  There is always a concern of gradual progression of memory problems. If this is the case, then we may need to adjust level of care according to patient needs. Support, both to the patient and caregiver, should then be put into place.      You have been referred for a neuropsychological evaluation (i.e., evaluation of memory and thinking abilities). Please bring someone with you to this appointment if possible, as it is helpful for the doctor to hear from both you and another adult who knows you well. Please bring eyeglasses and hearing aids if you wear them.    The evaluation will take approximately 3 hours and has two parts:   The first part is a clinical interview with the neuropsychologist (Dr. Melvyn Novas or Dr. Nicole Kindred). During the interview, the neuropsychologist will speak with you and the individual you brought to the appointment.    The second part of the evaluation is testing with the doctor's technician Hinton Dyer or Maudie Mercury). During the testing, the technician will ask you to remember different types of material, solve problems, and answer some questionnaires. Your family member will not be present for this portion of the evaluation.   Please note: We must reserve several hours  of the neuropsychologist's time and the psychometrician's time for your evaluation appointment. As such, there is a No-Show fee of $100. If you are unable to attend any of your appointments, please contact our office as soon as possible to reschedule.    FALL PRECAUTIONS: Be cautious when walking. Scan the area for obstacles that may increase the risk of trips and falls.  When getting up in the mornings, sit up at the edge of the bed for a few minutes before getting out of bed. Consider elevating the bed at the head end to avoid drop of blood pressure when getting up. Walk always in a well-lit room (use night lights in the walls). Avoid area rugs or power cords from appliances in the middle of the walkways. Use a walker or a cane if necessary and consider physical therapy for balance exercise. Get your eyesight checked regularly.  FINANCIAL OVERSIGHT: Supervision, especially oversight when making financial decisions or transactions is also recommended.  HOME SAFETY: Consider the safety of the kitchen when operating appliances like stoves, microwave oven, and blender. Consider having supervision and share cooking responsibilities until no longer able to participate in those. Accidents with firearms and other hazards in the house should be identified and addressed as well.   ABILITY TO BE LEFT ALONE: If patient is unable to contact 911 operator, consider using LifeLine, or when the need is there, arrange for someone to stay with patients. Smoking is a fire hazard, consider supervision or cessation. Risk of wandering should be assessed by caregiver and if detected at any point, supervision and safe proof recommendations should be instituted.  MEDICATION SUPERVISION: Inability to self-administer medication needs to be constantly addressed. Implement a mechanism to ensure safe administration of the medications.   DRIVING: Regarding driving, in patients with progressive memory problems, driving will be impaired. We advise to have someone else do the driving if trouble finding directions or if minor accidents are reported. Independent driving assessment is available to determine safety of driving.   If you are interested in the driving assessment, you can contact the following:  [employer] in [place]  [place]  [place] [phone] or [phone]    [place] refers to food and lifestyle choices that are based on the traditions of countries located on the [employer]. This way of eating has been shown to help prevent certain conditions and improve outcomes for people who have chronic diseases, like kidney disease and heart disease. What are tips for following this plan? Lifestyle  Cook and eat meals together with your family, when possible. Drink enough fluid to keep your urine clear or pale yellow. Be physically active every day. This includes: Aerobic exercise like running or swimming. Leisure activities like gardening, walking, or housework. Get 7-8 hours of sleep each night. If recommended by your health care provider, drink red wine in moderation. This means 1 glass a day for nonpregnant women and 2 glasses a day for men. A glass of wine equals 5 oz (150 mL). Reading food labels  Check the serving size of packaged foods. For foods such as rice and pasta, the serving size refers to the amount of cooked product, not dry. Check the total fat in packaged foods. Avoid foods that have saturated fat or trans fats. Check the ingredients list for added sugars, such as corn syrup. Shopping  At the grocery store, buy most of your food from  the areas near the walls of the store. This includes: Fresh fruits and vegetables (produce). Grains, beans, nuts, and seeds. Some of these may be available in unpackaged forms or large amounts (in bulk). Fresh seafood. Poultry and eggs. Low-fat dairy products. Buy whole ingredients instead of prepackaged foods. Buy fresh fruits and vegetables in-season from local farmers markets. Buy frozen fruits and vegetables in resealable bags. If you do not have access to quality fresh seafood, buy precooked frozen shrimp or canned fish, such as tuna, salmon, or sardines. Buy  small amounts of raw or cooked vegetables, salads, or olives from the deli or salad bar at your store. Stock your pantry so you always have certain foods on hand, such as olive oil, canned tuna, canned tomatoes, rice, pasta, and beans. Cooking  Cook foods with extra-virgin olive oil instead of using butter or other vegetable oils. Have meat as a side dish, and have vegetables or grains as your main dish. This means having meat in small portions or adding small amounts of meat to foods like pasta or stew. Use beans or vegetables instead of meat in common dishes like chili or lasagna. Experiment with different cooking methods. Try roasting or broiling vegetables instead of steaming or sauteing them. Add frozen vegetables to soups, stews, pasta, or rice. Add nuts or seeds for added healthy fat at each meal. You can add these to yogurt, salads, or vegetable dishes. Marinate fish or vegetables using olive oil, lemon juice, garlic, and fresh herbs. Meal planning  Plan to eat 1 vegetarian meal one day each week. Try to work up to 2 vegetarian meals, if possible. Eat seafood 2 or more times a week. Have healthy snacks readily available, such as: Vegetable sticks with hummus. Greek yogurt. Fruit and nut trail mix. Eat balanced meals throughout the week. This includes: Fruit: 2-3 servings a day Vegetables: 4-5 servings a day Low-fat dairy: 2 servings a day Fish, poultry, or lean meat: 1 serving a day Beans and legumes: 2 or more servings a week Nuts and seeds: 1-2 servings a day Whole grains: 6-8 servings a day Extra-virgin olive oil: 3-4 servings a day Limit red meat and sweets to only a few servings a month What are my food choices? Mediterranean diet Recommended Grains: Whole-grain pasta. Brown rice. Bulgar wheat. Polenta. Couscous. Whole-wheat bread. [place]. Vegetables: Artichokes. Beets. Broccoli. Cabbage. Carrots. Eggplant. Green beans. Chard. Kale. Spinach. Onions. Leeks. Peas.  Squash. Tomatoes. Peppers. Radishes. Fruits: Apples. Apricots. Avocado. Berries. Bananas. Cherries. Dates. Figs. Grapes. Lemons. Melon. Oranges. Peaches. Plums. Pomegranate. Meats and other protein foods: Beans. Almonds. Sunflower seeds. Pine nuts. Peanuts. [place]. Salmon. Scallops. Shrimp. [place]. Tilapia. Clams. Oysters. Eggs. Dairy: Low-fat milk. Cheese. Greek yogurt. Beverages: Water. Red wine. Herbal tea. Fats and oils: Extra virgin olive oil. Avocado oil. Grape seed oil. Sweets and desserts: [place] yogurt with honey. Baked apples. Poached pears. Trail mix. Seasoning and other foods: Basil. Cilantro. Coriander. Cumin. Mint. Parsley. Sage. Rosemary. Tarragon. Garlic. Oregano. Thyme. Pepper. Balsalmic vinegar. Tahini. Hummus. Tomato sauce. Olives. Mushrooms. Limit these Grains: Prepackaged pasta or rice dishes. Prepackaged cereal with added sugar. Vegetables: Deep fried potatoes (french fries). Fruits: Fruit canned in syrup. Meats and other protein foods: Beef. Pork. Lamb. Poultry with skin. Hot dogs. [place] Salines. Dairy: Ice cream. Sour cream. Whole milk. Beverages: Juice. Sugar-sweetened soft drinks. Beer. Liquor and spirits. Fats and oils: Butter. Canola oil. Vegetable oil. Beef fat (tallow). Lard. Sweets and desserts: Cookies. Cakes. Pies. Candy. Seasoning and other foods: Mayonnaise. Premade sauces  and marinades. The items listed may not be a complete list. Talk with your dietitian about what dietary choices are right for you. Summary The Mediterranean diet includes both food and lifestyle choices. Eat a variety of fresh fruits and vegetables, beans, nuts, seeds, and whole grains. Limit the amount of red meat and sweets that you eat. Talk with your health care provider about whether it is safe for you to drink red wine in moderation. This means 1 glass a day for nonpregnant women and 2 glasses a day for men. A glass of wine equals 5 oz (150 mL). This information is not intended to replace advice  given to you by your health care provider. Make sure you discuss any questions you have with your health care provider. Document Released: 11/17/2015 Document Revised: 12/20/2015 Document Reviewed: 11/17/2015 Elsevier Interactive Patient Education  2017 [employer].

## 2022-06-03 ENCOUNTER — Encounter: Payer: Self-pay | Admitting: Physician Assistant

## 2022-06-12 ENCOUNTER — Encounter: Payer: Self-pay | Admitting: Physician Assistant

## 2022-06-15 ENCOUNTER — Ambulatory Visit (INDEPENDENT_AMBULATORY_CARE_PROVIDER_SITE_OTHER): Payer: Medicare Other | Admitting: Nurse Practitioner

## 2022-06-15 ENCOUNTER — Encounter: Payer: Self-pay | Admitting: Nurse Practitioner

## 2022-06-15 VITALS — BP 128/58 | HR 66 | Ht 67.0 in | Wt 162.0 lb

## 2022-06-15 DIAGNOSIS — F01A0 Vascular dementia, mild, without behavioral disturbance, psychotic disturbance, mood disturbance, and anxiety: Secondary | ICD-10-CM

## 2022-06-15 DIAGNOSIS — J449 Chronic obstructive pulmonary disease, unspecified: Secondary | ICD-10-CM

## 2022-06-15 DIAGNOSIS — I10 Essential (primary) hypertension: Secondary | ICD-10-CM

## 2022-06-15 DIAGNOSIS — E538 Deficiency of other specified B group vitamins: Secondary | ICD-10-CM | POA: Diagnosis not present

## 2022-06-15 DIAGNOSIS — E785 Hyperlipidemia, unspecified: Secondary | ICD-10-CM

## 2022-06-15 DIAGNOSIS — R739 Hyperglycemia, unspecified: Secondary | ICD-10-CM

## 2022-06-15 NOTE — Patient Instructions (Addendum)
To make appt for fasting blood work ~2 weeks  Schedule AWV for beginning of may  To restart memantine at half tablet with breakfast for 2 weeks- if doing well with this then increase to 5 mg daily with breakfast

## 2022-06-15 NOTE — Progress Notes (Signed)
Careteam: Patient Care Team: Lauree Chandler, NP as PCP - General (Geriatric Medicine) Fanny Bien, MD as Attending Physician (Emergency Medicine) Pieter Partridge, DO as Consulting Physician (Neurology) Delana Meyer, Dolores Lory, MD (Vascular Surgery) Elease Hashimoto (Neurology)  PLACE OF SERVICE:  [place]  Advanced Directive information    Allergies  Allergen Reactions  . Penicillins Other (See Comments)    Unknown childhood allergic reaction - severe Has patient had a PCN reaction causing immediate rash, facial/tongue/throat swelling, SOB or lightheadedness with hypotension: Unknown Has patient had a PCN reaction causing severe rash involving mucus membranes or skin necrosis: Unknown Has patient had a PCN reaction that required hospitalization: Yes Has patient had a PCN reaction occurring within the last 10 years: No If all of the above answers are "NO", then may proceed with Cephalosporin use.  . Xanax [Alprazolam] Shortness Of Breath  . Atorvastatin Other (See Comments)    Difficulty breathing   . Bupropion Other (See Comments)    Unknown reaction  . Citalopram Other (See Comments)    Unknown reaction  . Cymbalta [Duloxetine Hcl] Other (See Comments)    Unknown reaction  . Duloxetine Other (See Comments)  . Fluoxetine Other (See Comments)    Unknown reaction  . Fluvoxamine Other (See Comments)    Unknown reaction  . Lexapro [Escitalopram Oxalate] Other (See Comments)    Unknown reaction  . Niacin And Related Other (See Comments)    Unknown reaction  . Oxcarbazepine Other (See Comments)    Unknown reaction  . Pravastatin Other (See Comments)    Unknown reaction  . Quetiapine Other (See Comments)  . Seroquel [Quetiapine Fumarate] Other (See Comments)    Unknown reaction  . Statins Other (See Comments)    Unknown adverse reaction  . Tamsulosin Other (See Comments)    Unknown reaction  . Zoloft [Sertraline Hcl] Other (See Comments)    Unknown  reaction    Chief Complaint  Patient presents with  . Establish Care  . Allergic Reaction    Patient reports he may be having reactions to Namenda - takes rx qhs, reaction appears as tingling to the face/neck/lips, resolves with eating     HPI: Patient is a 77 y.o. male to establish care.  [facility]   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  Has budesonide but has not needed  Uses albuterol rarely. Taking trelegy daily  Htn- taking norvasc 10 mg daily, using coreg BID with altace 10 mg daily   CAD s/p CABG- on ASA 81 mg daily with plavix 75 mg daily   B12 def- on 1000 mcg daily    Nasal congestion- using azelastine qhs with zyrtec 10 mg   Dementia- on aricept 5 mg with namenda 5 mg daily- has been on current dose since last June.  Reports he just saw neurologist on 06/01/22.  Reports aricept helped for a while but felt like he was drifting back down.  He is living out of his phone and note book to keep him going.  He reports he is having symptoms 12 hours after taking medication- similar as to when he started aricept.  He sent message to the neurologist and she instructed him to hold medication for 1 week to see if symptoms improved. He has not taken medication since the 5th and symptoms  have improved.   Has arthritis in hands- using voltaren gel  Hyperlipidemia- on rapatha with zetia for cholesterol.   Hx of sleep apnea- was on cpap but went to ENT but had procedure and then was able to breath normal after that  GERD- resolved, not on medication at this time.   Review of Systems:  ROS***  Past Medical History:  Diagnosis Date  . Arteriosclerosis of coronary artery 12/22/2014  . Asthma   . Benign prostatic hyperplasia without lower urinary tract symptoms 09/13/2012  . Cervical spondylosis 02/27/2013  . Chronic idiopathic  constipation 11/02/2015  . Chronic obstructive pulmonary disease 12/22/2014  . Coronary artery disease   . DDD (degenerative disc disease), cervical 02/27/2013  . DDD (degenerative disc disease), lumbosacral 02/27/2013  . Diabetes mellitus without complication   . Dysphagia 10/18/2016  . ED (erectile dysfunction)   . Generalized anxiety disorder 01/21/2013  . GERD (gastroesophageal reflux disease)   . Heart murmur    EJECTION MURMUR  . HLD (hyperlipidemia)   . Hx of colon polyps   . Hx of coronary artery bypass surgery 12/22/2014  . Hypertension   . Low back pain   . Lumbosacral spondylosis 02/27/2013  . Mild neurocognitive disorder 04/28/2019  . Post-traumatic stress disorder, unspecified 02/27/2013  . Sleep apnea   . Status post coronary artery stent placement 10/30/2017   SVG to OM and L main.  . Stenosis of carotid artery 11/20/2017   Added automatically from request for surgery [id number]  . 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 Thrombocytopenia ([place])   . Vitamin D deficiency    Past Surgical History:  Procedure Laterality Date  . ADENOIDECTOMY  2017  . ANOMALOUS PULMONARY VENOUS RETURN REPAIR, TOTAL  2021  . CAROTID STENT  2007  . CAROTID STENT  2001  . CATARACT EXTRACTION Bilateral 2017  . COLONOSCOPY WITH PROPOFOL N/A 10/31/2016   Procedure: COLONOSCOPY WITH PROPOFOL;  Surgeon: Manya Silvas, MD;  Location[facility] END[facility]COPY;  Service: Endoscopy;  Laterality: N/A;  . CORONARY ANGIOPLASTY    . CORONARY ARTERY BYPASS GRAFT  2006   triple  . ESOPHAGOGASTRODUODENOSCOPY  2016  . LUMBAR FUSION  2001  . SEPTOPLASTY    . TONSILLECTOMY     Social History:   reports that he quit smoking about 33 years ago. His smoking use included cigarettes. He has never used smokeless tobacco. He reports that he does not drink alcohol and does not use drugs.  Family History  Problem Relation Age of Onset  . Headache Mother   . Cancer Father        Lung cancer  . Parkinson's disease  Paternal Grandmother        Unknown if paternal or maternal side of family  . Alzheimer's disease Paternal Grandfather        Unknown if paternal or maternal side of family    Medications: Patient's Medications  New Prescriptions   No medications on file  Previous Medications   ALBUTEROL (VENTOLIN HFA) 108 (90 BASE) MCG/ACT INHALER    TAKE 1 PUFF (INHALATION) EVERY 6 HOURS (AS NEEDED FOR SHORTNESS OF BREATH OR WHEEZING) FOR 10 DAYS   AMLODIPINE (NORVASC) 10 MG TABLET    Take by mouth.   ASPIRIN EC 81 MG TABLET    Take 81 mg by mouth daily.   AZELASTINE HCL (ASTEPRO) 0.15 % SOLN    Place 1 spray into the nose at bedtime.   BUDESONIDE (PULMICORT) 1 MG/2ML NEBULIZER SOLUTION    Take  1 mg by nebulization as needed.   CARVEDILOL (COREG) 6.25 MG TABLET    Take 1 tablet (6.25 mg total) by mouth 2 (two) times daily.   CETIRIZINE (ZYRTEC) 10 MG TABLET    Take 10 mg by mouth daily.   CLINDAMYCIN (CLEOCIN) 150 MG CAPSULE    Take 150 mg by mouth 2 (two) times daily. Dental Procedures   CLOPIDOGREL (PLAVIX) 75 MG TABLET    Take 75 mg by mouth daily.   CYANOCOBALAMIN (VITAMIN B12) 1000 MCG TABLET    Take 1,000 mcg by mouth daily.   DICLOFENAC SODIUM (VOLTAREN) 1 % GEL    Apply topically 4 (four) times daily.   DONEPEZIL (ARICEPT) 5 MG TABLET    Take 1 tablet (5 mg total) by mouth at bedtime.   EVOLOCUMAB (REPATHA SURECLICK) [id number] MG/ML SOAJ    Inject 140 mg into the skin every 14 (fourteen) days.   EZETIMIBE (ZETIA) 10 MG TABLET    TAKE 1 TABLET BY MOUTH DAILY   FLUTICASONE-UMECLIDIN-VILANT (TRELEGY ELLIPTA) 100-62.5-25 MCG/ACT AEPB    Inhale into the lungs.   MEMANTINE (NAMENDA) 5 MG TABLET    Take 1 tablet (5 mg total) by mouth at bedtime.   RAMIPRIL (ALTACE) 10 MG CAPSULE    Take 10 mg by mouth daily.  Modified Medications   No medications on file  Discontinued Medications   AZITHROMYCIN (ZITHROMAX) 250 MG TABLET    Take 250 mg by mouth daily.   LIVALO 2 MG TABS    Take 1 tablet by mouth daily.    MAGNESIUM PO    Take by mouth.   RAMIPRIL (ALTACE) 2.5 MG CAPSULE    Take 2.5 mg by mouth daily.    Physical Exam:  Vitals:   06/15/22 1046  BP: (!) 128/58  Pulse: 66  SpO2: 98%  Weight: 162 lb (73.5 kg)  Height: '5\' 7"'$  (1.702 m)   Body mass index is 25.37 kg/m. Wt Readings from Last 3 Encounters:  06/15/22 162 lb (73.5 kg)  06/01/22 164 lb (74.4 kg)  01/05/22 158 lb (71.7 kg)    Physical Exam***  Labs reviewed: Basic Metabolic Panel: No results for input(s): "NA", "K", "CL", "CO2", "GLUCOSE", "BUN", "CREATININE", "CALCIUM", "MG", "PHOS", "TSH" in the last 8760 hours. Liver Function Tests: No results for input(s): "AST", "ALT", "ALKPHOS", "BILITOT", "PROT", "ALBUMIN" in the last 8760 hours. No results for input(s): "LIPASE", "AMYLASE" in the last 8760 hours. No results for input(s): "AMMONIA" in the last 8760 hours. CBC: No results for input(s): "WBC", "NEUTROABS", "HGB", "HCT", "MCV", "PLT" in the last 8760 hours. Lipid Panel: No results for input(s): "CHOL", "HDL", "LDLCALC", "TRIG", "CHOLHDL", "LDLDIRECT" in the last 8760 hours. TSH: No results for input(s): "TSH" in the last 8760 hours. A1C: Lab Results  Component Value Date   HGBA1C 6.1 (H) 11/16/2017     Assessment/Plan There are no diagnoses linked to this encounter.  No follow-ups on file.: ***  Rikita Grabert K. [place], [facility] [phone]

## 2022-06-19 ENCOUNTER — Other Ambulatory Visit: Payer: Medicare Other

## 2022-06-19 DIAGNOSIS — I10 Essential (primary) hypertension: Secondary | ICD-10-CM

## 2022-06-19 DIAGNOSIS — R739 Hyperglycemia, unspecified: Secondary | ICD-10-CM

## 2022-06-19 DIAGNOSIS — E785 Hyperlipidemia, unspecified: Secondary | ICD-10-CM

## 2022-06-20 LAB — CBC WITH DIFFERENTIAL/PLATELET
Absolute Monocytes: 706 cells/uL (ref 200–950)
Basophils Absolute: 58 cells/uL (ref 0–200)
Basophils Relative: 0.8 %
Eosinophils Absolute: 130 cells/uL (ref 15–500)
Eosinophils Relative: 1.8 %
HCT: 46 % (ref 38.5–50.0)
Hemoglobin: 15.6 g/dL (ref 13.2–17.1)
Lymphs Abs: 1922 cells/uL (ref 850–3900)
MCH: 30.1 pg (ref 27.0–33.0)
MCHC: 33.9 g/dL (ref 32.0–36.0)
MCV: 88.8 fL (ref 80.0–100.0)
MPV: 9.5 fL (ref 7.5–12.5)
Monocytes Relative: 9.8 %
Neutro Abs: 4385 cells/uL (ref 1500–7800)
Neutrophils Relative %: 60.9 %
Platelets: 218 10*3/uL (ref 140–400)
RBC: 5.18 10*6/uL (ref 4.20–5.80)
RDW: 12.6 % (ref 11.0–15.0)
Total Lymphocyte: 26.7 %
WBC: 7.2 10*3/uL (ref 3.8–10.8)

## 2022-06-20 LAB — COMPLETE METABOLIC PANEL WITH GFR
AG Ratio: 2.1 (calc) (ref 1.0–2.5)
ALT: 14 U/L (ref 9–46)
AST: 16 U/L (ref 10–35)
Albumin: 4.6 g/dL (ref 3.6–5.1)
Alkaline phosphatase (APISO): 68 U/L (ref 35–144)
BUN: 13 mg/dL (ref 7–25)
CO2: 26 mmol/L (ref 20–32)
Calcium: 9 mg/dL (ref 8.6–10.3)
Chloride: 101 mmol/L (ref 98–110)
Creat: 0.75 mg/dL (ref 0.70–1.28)
Globulin: 2.2 g/dL (calc) (ref 1.9–3.7)
Glucose, Bld: 102 mg/dL — ABNORMAL HIGH (ref 65–99)
Potassium: 4.8 mmol/L (ref 3.5–5.3)
Sodium: 140 mmol/L (ref 135–146)
Total Bilirubin: 0.7 mg/dL (ref 0.2–1.2)
Total Protein: 6.8 g/dL (ref 6.1–8.1)
eGFR: 94 mL/min/{1.73_m2} (ref >=60)

## 2022-06-20 LAB — LIPID PANEL
Cholesterol: 101 mg/dL (ref <200)
HDL: 49 mg/dL (ref >=40)
LDL Cholesterol (Calc): 35 mg/dL (calc)
Non-HDL Cholesterol (Calc): 52 mg/dL (calc) (ref <130)
Total CHOL/HDL Ratio: 2.1 (calc) (ref <5.0)
Triglycerides: 91 mg/dL (ref <150)

## 2022-06-20 LAB — HEMOGLOBIN A1C
Hgb A1c MFr Bld: 6.1 % of total Hgb — ABNORMAL HIGH (ref <5.7)
Mean Plasma Glucose: 128 mg/dL
eAG (mmol/L): 7.1 mmol/L

## 2022-06-21 NOTE — Telephone Encounter (Signed)
Pt had labs drawn on 06/19/22 with PCP office LDL 35. Labs in [place].

## 2022-07-04 ENCOUNTER — Encounter: Payer: Medicare Other | Admitting: Nurse Practitioner

## 2022-07-04 ENCOUNTER — Encounter: Payer: Self-pay | Admitting: Nurse Practitioner

## 2022-07-04 LAB — HM DIABETES EYE EXAM

## 2022-07-04 NOTE — Progress Notes (Signed)
  This service is provided via telemedicine  No vital signs collected/recorded due to the encounter was a telemedicine visit.   Location of patient (ex: home, work):  Home  Patient consents to a telephone visit:  Yes  Location of the provider (ex: office, home):  [facility].  Name of any referring provider:  Eubanks, Jessica K, NP   Names of all persons participating in the telemedicine service and their role in the encounter:  Patient, Mollye Guinta, RMA, Jessica Eubanks, NP.    Time spent on call: 8 minutes spent on the phone with Medical Assistant.   

## 2022-07-05 ENCOUNTER — Ambulatory Visit: Payer: Medicare Other | Admitting: Internal Medicine

## 2022-07-05 ENCOUNTER — Encounter: Payer: Self-pay | Admitting: Internal Medicine

## 2022-07-05 VITALS — BP 120/60 | HR 62 | Temp 97.9°F | Ht 67.0 in | Wt 164.2 lb

## 2022-07-05 DIAGNOSIS — J4489 Other specified chronic obstructive pulmonary disease: Secondary | ICD-10-CM

## 2022-07-05 DIAGNOSIS — J439 Emphysema, unspecified: Secondary | ICD-10-CM

## 2022-07-05 MED ORDER — TRELEGY ELLIPTA 100-62.5-25 MCG/ACT IN AEPB: 1.0000 | INHALATION_SPRAY | Freq: Every day | RESPIRATORY_TRACT | 11 refills | Status: DC

## 2022-07-05 NOTE — Progress Notes (Signed)
Albert Hayes    [id number]    02/01/1946  Primary Care Physician:Eubanks, Carlos American, NP  Referring Physician: Lauree Chandler, NP [place],  [place] [address] Reason for Consultation: COPD Date of Consultation: 07/05/2022  Chief complaint:   Chief Complaint  Patient presents with   Consult    To establish care with Pulmonologist.  Currently patient at [facility].  Want to transfer care to [facility].  Dx COPD 3.     HPI: Albert Hayes is a 77 y.o. man with history of COPD who presents to establish care.  Previously followed by Dr. Lanney Gins at [facility].   History of AS s/p TAVR in 2021. Never had full pfts but had spirometry in [place]. FEV1 43%, he was told he had stage 3 COPD  He is on trelegy inhaler 1 puff once a day, and this works well for him. Feels his breathing improved once he started it. No exacerbations of COPD in the last year.   Never been hospitalized for his breathing. Does not need to use albuterol inhaler much at all. Independent with ADLs.   Hobbies - walking, volunteers with his church, listens to music.   Social history:  Occupation: retired, Chief Financial Officer.  Exposures: lives at home with wife.  Smoking history: quit in 1990, 30 pack years.   Social History   Occupational History   Occupation: Retired Scientist, clinical (histocompatibility and immunogenetics)  Tobacco Use   Smoking status: Former    Packs/day: 1.00    Years: 30.00    Additional pack years: 0.00    Total pack years: 30.00    Types: Cigarettes    Quit date: 10/30/1988    Years since quitting: 33.7   Smokeless tobacco: Never  Vaping Use   Vaping Use: Never used  Substance and Sexual Activity   Alcohol use: No    Alcohol/week: 0.0 standard drinks of alcohol   Drug use: No   Sexual activity: Not on file    Relevant family history:  Family History  Problem Relation Age of Onset   Headache Mother    Cancer Father        Lung cancer    Parkinson's disease Paternal Grandmother        Unknown if paternal or maternal side of family   Alzheimer's disease Paternal Grandfather        Unknown if paternal or maternal side of family    Past Medical History:  Diagnosis Date   Arteriosclerosis of coronary artery 12/22/2014   Asthma    Benign prostatic hyperplasia without lower urinary tract symptoms 09/13/2012   Cervical spondylosis 02/27/2013   Chronic idiopathic constipation 11/02/2015   Chronic obstructive pulmonary disease 12/22/2014   Coronary artery disease    DDD (degenerative disc disease), cervical 02/27/2013   DDD (degenerative disc disease), lumbosacral 02/27/2013   Dysphagia 10/18/2016   ED (erectile dysfunction)    Generalized anxiety disorder 01/21/2013   GERD (gastroesophageal reflux disease)    Heart murmur    EJECTION MURMUR   HLD (hyperlipidemia)    Hx of colon polyps    Hx of coronary artery bypass surgery 12/22/2014   Hypertension    Low back pain    Lumbosacral spondylosis 02/27/2013   Mild neurocognitive disorder 04/28/2019   Post-traumatic stress disorder, unspecified 02/27/2013   Sleep apnea    Status post coronary artery stent placement 10/30/2017   SVG to OM and L  main.   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   Thrombocytopenia ([place])    Vitamin D deficiency     Past Surgical History:  Procedure Laterality Date   ADENOIDECTOMY  2017   ANOMALOUS PULMONARY VENOUS RETURN REPAIR, TOTAL  2021   CAROTID STENT  2007   CAROTID STENT  2001   CATARACT EXTRACTION Bilateral 2017   COLONOSCOPY WITH PROPOFOL N/A 10/31/2016   Procedure: COLONOSCOPY WITH PROPOFOL;  Surgeon: Manya Silvas, MD;  Location: [facility] ENDOSCOPY;  Service: Endoscopy;  Laterality: N/A;   CORONARY ANGIOPLASTY     CORONARY ARTERY BYPASS GRAFT  2006   triple   ESOPHAGOGASTRODUODENOSCOPY  2016   LUMBAR FUSION  2001   SEPTOPLASTY      TONSILLECTOMY       Physical Exam: Blood pressure 120/60, pulse 62, temperature 97.9 F (36.6 C), temperature source Oral, height 5\' 7"  (1.702 m), weight 164 lb 3.2 oz (74.5 kg), SpO2 96 %. Gen:      No acute distress ENT:  no nasal polyps, mucus membranes moist Lungs:    No increased respiratory effort, symmetric chest wall excursion, clear to auscultation bilaterally, no wheezes or crackles CV:         Regular rate and rhythm; no murmurs, rubs, or gallops.  No pedal edema Abd:      + bowel sounds; soft, non-tender; no distension MSK: no acute synovitis of DIP or PIP joints, no mechanics hands.  Skin:      Warm and dry; no rashes Neuro: normal speech, no focal facial asymmetry Psych: alert and oriented x3, normal mood and affect   Data Reviewed/Medical Decision Making:  Independent interpretation of tests: Imaging:  Review of patient's CT Chest May 2023 images revealed no acute pulmonary process small LLL LGGOs some inflammation or infection. The patient's images have been independently reviewed by me.    PFTs: I have personally reviewed the patient's PFTs and based on studies done at [facility]: FEV1 09/20/21-1.08L-41% 10/25/21- 1.09L - 48%  Labs:  Lab Results  Component Value Date   NA 140 06/19/2022   K 4.8 06/19/2022   CO2 26 06/19/2022   GLUCOSE 102 (H) 06/19/2022   BUN 13 06/19/2022   CREATININE 0.75 06/19/2022   CALCIUM 9.0 06/19/2022   EGFR 94 06/19/2022   GFRNONAA >60 11/15/2017   .  Immunization status:  Immunization History  Administered Date(s) Administered   COVID-19, mRNA, vaccine(Comirnaty)12 years and older 03/13/2022   Influenza, High Dose Seasonal PF 12/08/2014, 01/03/2016, 11/28/2016, 11/25/2018, 12/11/2019, 01/22/2022   Influenza,inj,quad, With Preservative 02/11/2018   Influenza-Unspecified 12/15/2013, 12/08/2014, 02/04/2015, 01/03/2016, 12/11/2019, 01/02/2021   PFIZER Comirnaty(Gray Top)Covid-19 Tri-Sucrose Vaccine  05/23/2019, 06/14/2019, 01/10/2020   PFIZER(Purple Top)SARS-COV-2 Vaccination 05/23/2019, 06/14/2019   PNEUMOCOCCAL CONJUGATE-20 01/22/2022   Pneumococcal Conjugate,unspecified 04/20/2014   Pneumococcal Conjugate-13 04/20/2012, 04/20/2014   Pneumococcal Polysaccharide-23 02/22/2020   Pneumococcal-Unspecified 01/07/2014   Rsv, Bivalent, Protein Subunit Rsvpref,pf Evans Lance) 01/22/2022   Tdap 04/09/2008, 04/20/2014   Zoster Recombinat (Shingrix) 08/09/2016, 12/20/2016   Zoster, Live 06/07/2013, 08/09/2016     I reviewed prior external note(s) from pulmonary  I reviewed the result(s) of the labs and imaging as noted above.   I have ordered    Assessment:  COPD, FEV1 41% of predicted History of tobacco use disorder  Plan/Recommendations:  Needs refill on trelegy today. Will continue this. Continue prn albuterol.   Outside the window for lung  cancer screening.   Seems to be doing well without significant respiratory symptom burden.  Return to Care: Return in about 1 year (around 07/05/2023).  Lenice Llamas, MD Pulmonary and [place]  CC: Lauree Chandler, NP

## 2022-07-05 NOTE — Patient Instructions (Signed)
Please schedule follow up scheduled with myself in 1 year.  If my schedule is not open yet, we will contact you with a reminder closer to that time. Please call [phone] if you haven't heard from [place] a month before.   Continue the trelgy inhaler 1 puff once a day.  If you are feel that you are getting sick can take additional nebulizer treatments or albuterol.  Please call me sooner if any issues or concerns with your breathing.

## 2022-07-06 ENCOUNTER — Other Ambulatory Visit: Payer: Medicare Other

## 2022-07-06 ENCOUNTER — Ambulatory Visit: Payer: Medicare Other | Admitting: Nurse Practitioner

## 2022-07-13 NOTE — Progress Notes (Signed)
Cardiology Office Note:    Date:  07/16/2022   ID:  [name], DOB [birth year], MRN [id number]  PCP:  Sharon Seller, NP   [facility] Providers Cardiologist:  Donato Schultz, MD     Referring MD: No ref. provider found   Chief Complaint: follow-up CAD, s/p TAVR  History of Present Illness:    Albert Hayes is a very pleasant 77 y.o. male with a hx of CAD s/p CABG 12/2014, severe aortic stenosis s/p TAVR, COPD, GERD, HTN, type 2 DM, HLD, carotid artery disease s/p endarterectomy, stroke, former tobacco use, and thrombocytopenia.   History of CABG s/p PCI of the vein graft to the OM 2.  Patent LIMA to LAD and vein graft to RPDA.  Referred to: Cardiology and seen by Dr. Anne Fu on 01/05/2022 per patient request.  Previously seen by Dr. Lady Gary, cardiologist at [facility]. Per notes by Dr. Anne Fu, he personally reviewed Dr. America Brown notes which report carotid dopplers showed less than 50% stenosis bilaterally. He is s/p repair right carotid artery by endarterectomy which was widely patent on CT.  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  Noted to have statin intolerance. On Zetia for arteriosclerosis of coronary artery s/p CABG.   At office visit 01/05/2022, he reported concern about more confusion recently.  Had a brain MRI the week previous. Currently on Aricept. History of stroke in 2020.  He subsequently underwent endarterectomy of the right carotid. He was felt to be stable form a cardiac perspective and advised to return in 6 months for follow-up.  He reported nighttime leg cramping 01/06/2022.  He underwent lower extremity arterial duplex which revealed no evidence of significant blood flow on 01/26/22.   Seen by Malena Peer, [facility] 05/15/22 for lipid management. Previously on Livalo but insurance will no longer cover. Previous statin intolerance sent  him to ED with seizure like activity. Started on Repatha and continuing ezetimibe.   Today, he is here alone for follow-up.  Reports he has overall been doing well, a little frustrated with his energy level. Has been diagnosed with vascular dementia and soon after starting memantine he had dizziness. This gradually resolved. Remains active, doing work for his church, mostly on the computer. Walks for 2-3 hours 2-3 times per week. Also does housework. Hears his valve when he is lying in bed at times, does not think the noise has worsened recently. When BP was elevated in the past, the noise was louder. Stays well hydrated, heart healthy diet mostly cooks and eats at home. He denies chest pain, shortness of breath, lower extremity edema, palpitations, melena, hematuria, hemoptysis, diaphoresis, weakness, presyncope, syncope, orthopnea, and PND. BP has been well controlled.   Past Medical History:  Diagnosis Date   Arteriosclerosis of coronary artery 12/22/2014   Asthma    Benign prostatic hyperplasia without lower urinary tract symptoms 09/13/2012   Cervical spondylosis 02/27/2013   Chronic idiopathic constipation 11/02/2015   Chronic obstructive pulmonary disease 12/22/2014   Coronary artery disease    DDD (degenerative disc disease), cervical 02/27/2013   DDD (degenerative disc disease), lumbosacral 02/27/2013   Dysphagia 10/18/2016   ED (erectile dysfunction)    Generalized anxiety disorder 01/21/2013   GERD (gastroesophageal reflux disease)    Heart murmur    EJECTION MURMUR   HLD (hyperlipidemia)    Hx of colon polyps  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   Stenosis of carotid artery 11/20/2017   Added  automatically from request for surgery [id number]   Stroke (cerebrum) 11/15/2017   Thrombocytopenia    Vitamin D deficiency     Past Surgical History:  Procedure Laterality Date   ADENOIDECTOMY  2017   ANOMALOUS PULMONARY VENOUS RETURN REPAIR, TOTAL  2021   CAROTID STENT  2007   CAROTID STENT  2001   CATARACT EXTRACTION Bilateral 2017   COLONOSCOPY WITH PROPOFOL N/A 10/31/2016   Procedure: COLONOSCOPY WITH PROPOFOL;  Surgeon: Scot Jun, MD;  Location: [facility] ENDOSCOPY;  Service: Endoscopy;  Laterality: N/A;   CORONARY ANGIOPLASTY     CORONARY ARTERY BYPASS GRAFT  2006   triple   ESOPHAGOGASTRODUODENOSCOPY  2016   LUMBAR FUSION  2001   SEPTOPLASTY     TONSILLECTOMY      Current Medications: Current Meds  Medication Sig   albuterol (VENTOLIN HFA) 108 (90 Base) MCG/ACT inhaler    amLODipine (NORVASC) 10 MG tablet Take 10 mg by mouth daily.   aspirin EC 81 MG tablet Take 81 mg by mouth daily.   Azelastine HCl (ASTEPRO) 0.15 % SOLN Place 1 spray into the nose at bedtime.   budesonide (PULMICORT) 1 MG/2ML nebulizer solution Take 1 mg by nebulization as needed.   carvedilol (COREG) 6.25 MG tablet Take 1 tablet (6.25 mg total) by mouth 2 (two) times daily.   clindamycin (CLEOCIN) 150 MG capsule Take 150 mg by mouth once. 2 tablets 2 hours prior to Dental Procedures   clopidogrel (PLAVIX) 75 MG tablet Take 75 mg by mouth daily.   cyanocobalamin (VITAMIN B12) 1000 MCG tablet Take 1,000 mcg by mouth daily.   diclofenac Sodium (VOLTAREN) 1 % GEL Apply topically 4 (four) times daily.   donepezil (ARICEPT) 5 MG tablet Take 1 tablet (5 mg total) by mouth at bedtime.   Evolocumab (REPATHA SURECLICK) 140 MG/ML SOAJ Inject 140 mg into the skin every 14 (fourteen) days.   ezetimibe (ZETIA) 10 MG tablet TAKE 1 TABLET BY MOUTH DAILY   Fluticasone-Umeclidin-Vilant (TRELEGY ELLIPTA) 100-62.5-25 MCG/ACT AEPB Inhale 1 puff into the lungs daily.   memantine (NAMENDA) 5 MG tablet Take 1 tablet (5 mg  total) by mouth at bedtime.   ramipril (ALTACE) 10 MG capsule Take 10 mg by mouth daily.     Allergies:   Penicillins, Xanax [alprazolam], Atorvastatin, Bupropion, Citalopram, Cymbalta [duloxetine hcl], Duloxetine, Fluoxetine, Fluvoxamine, Lexapro [escitalopram oxalate], Niacin and related, Oxcarbazepine, Pravastatin, Quetiapine, Seroquel [quetiapine fumarate], Statins, Tamsulosin, and Zoloft [sertraline hcl]   Social History   Socioeconomic History   Marital status: Married    Spouse name: Bonita Quin   Number of children: 3   Years of education: 16   Highest education level: Not on file  Occupational History   Occupation: Retired Surveyor, minerals  Tobacco Use   Smoking status: Former    Packs/day: 1.00    Years: 30.00    Additional pack years: 0.00    Total pack years: 30.00    Types: Cigarettes    Quit date: 10/30/1988    Years since quitting: 33.7   Smokeless tobacco: Never  Vaping Use   Vaping Use: Never  used  Substance and Sexual Activity   Alcohol use: No    Alcohol/week: 0.0 standard drinks of alcohol   Drug use: No   Sexual activity: Not on file  Other Topics Concern   Not on file  Social History Narrative   Married. Education: high school/other. Exercise: Yes.      Patient is right-handed.   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     Social Determinants of Health   Financial Resource Strain: Not on file  Food Insecurity: Not on file  Transportation Needs: Not on file  Physical Activity: Not on file  Stress: Not on file  Social Connections: Not on file     Family History: The patient's family history includes Alzheimer's disease in his paternal grandfather; Cancer in his father; Headache in his mother; Parkinson's disease in his paternal grandmother.  ROS:   Please see the history of present illness.   All other systems reviewed and are negative.  Labs/Other  Studies Reviewed:    The following studies were reviewed today:  Lower Extremity Arterial Studies 01/26/22 Summary:  Right: Resting right ankle-brachial index is within normal range. The  right toe-brachial index is normal.   Left: Resting left ankle-brachial index is within normal range. The left  toe-brachial index is normal.    *See table(s) above for measurements and observations.   CT Chest  09/06/2021: IMPRESSION: 1. Small foci of ground-glass opacity in the medial left lower lobe, which may reflect residual infection or inflammation. 2. No other evidence of pneumonia or of an acute abnormality in the chest. 3. Changes from previous cardiac surgery and TAVR with coronary artery calcifications. Aortic atherosclerosis.   Aortic Atherosclerosis (ICD10-I70.0).   CTA Neck 01/25/2021: IMPRESSION: 1. Sequela of dissection in the proximal left common carotid with nonocclusive flap. 2. Treated right ICA stenosis since 2019 without recurrence. 3. 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   Bilateral Carotid Doppler  08/28/2021: Summary:  Right Carotid: Velocities in the right ICA are consistent with a 1-39%  stenosis.                 Non-hemodynamically significant plaque <50% noted in the  CCA. The                 ECA appears <50% stenosed. Widely patent ICA s/p CEA.   Left Carotid: Velocities in the left ICA are consistent with a 1-39%  stenosis.                Non-hemodynamically significant plaque <50% noted in the  CCA. The                ECA appears <50% stenosed.   Vertebrals:  Bilateral vertebral arteries demonstrate antegrade flow.  Subclavians: Normal flow hemodynamics were seen in bilateral subclavian               arteries.   Recent Labs: 06/19/2022: ALT 14; BUN 13; Creat 0.75; Hemoglobin 15.6; Platelets 218; Potassium 4.8; Sodium 140  Recent Lipid Panel    Component Value Date/Time   CHOL 101  06/19/2022 0809   TRIG 91 06/19/2022 0809   HDL 49 06/19/2022 0809   CHOLHDL 2.1 06/19/2022 0809   VLDL 26 11/16/2017 0517   LDLCALC 35 06/19/2022 0809     Risk Assessment/Calculations:  Physical Exam:    VS:  BP (!) 110/58   Pulse (!) 58   Ht 5\' 7"  (1.702 m)   Wt 164 lb 3.2 oz (74.5 kg)   SpO2 96%   BMI 25.72 kg/m     Wt Readings from Last 3 Encounters:  07/16/22 164 lb 3.2 oz (74.5 kg)  07/05/22 164 lb 3.2 oz (74.5 kg)  06/15/22 162 lb (73.5 kg)     GEN:  Well nourished, well developed in no acute distress HEENT: Normal NECK: No JVD; No carotid bruits CARDIAC: RRR, audible click from TAVR, no murmurs, rubs, gallops RESPIRATORY:  Clear to auscultation without rales, wheezing or rhonchi  ABDOMEN: Soft, non-tender, non-distended MUSCULOSKELETAL:  No edema; No deformity. 2+ pedal pulses, equal bilaterally SKIN: Warm and dry NEUROLOGIC:  Alert and oriented x 3 PSYCHIATRIC:  Normal affect   EKG:  EKG is not ordered today.      Diagnoses:    1. Coronary artery disease involving native coronary artery of native heart without angina pectoris   2. Severe aortic stenosis   3. Hyperlipidemia LDL goal <70   4. S/P TAVR (transcatheter aortic valve replacement)   5. Bilateral carotid artery stenosis   6. Primary hypertension    Assessment and Plan:     CAD without angina: S/p CABG 2016. He denies chest pain, dyspnea, or other symptoms concerning for angina.  No indication for further ischemic evaluation at this time. No bleeding concerns. Continue GDMT including amlodipine, aspirin, carvedilol, clopidogrel, ezetimibe, Repatha, ramipril.  Hyperlipidemia LDL goal < 70: LDL 35 on 06/19/22. He is very excited about the significant improvement in LDL. No concerns with medications. Continue Repatha, ezetimibe.   Severe aortic stenosis:  s/p TAVR 09/2019. Hears noise from valve when lying down. This has been present since implantation. No significant murmur on exam.  Reviewed TTE from 01/2021 in Care Everywhere. He is asymptomatic. Will continue to monitor clinically for now.   Hypertension: BP is well-controlled, somewhat soft.  No symptoms of lightheadedness, presyncope, syncope. Renal function is stable on lab work 06/19/22. Continue current antihypertensive therapy.  Carotid artery disease: Prior endarterectomy of right carotid. Now follows with [facility].  Carotid duplex 08/2021 revealed widely patent ICA with 1 to 39% stenosis on the right, left with 1-39% stenosis. No symptoms of amaurosis fugax, TIA at present. Management per vascular surgery.     Disposition: 6 months with Dr. Anne Fu  Medication Adjustments/Labs and Tests Ordered: Current medicines are reviewed at length with the patient today.  Concerns regarding medicines are outlined above.  No orders of the defined types were placed in this encounter.  No orders of the defined types were placed in this encounter.   Patient Instructions  Medication Instructions:    Your physician recommends that you continue on your current medications as directed. Please refer to the Current Medication list given to you today.  *If you need a refill on your cardiac medications before your next appointment, please call your pharmacy*   Lab Work:  None ordered.  If you have labs (blood work) drawn today and your tests are completely normal, you will receive your results only by: MyChart Message (if you have MyChart) OR A paper copy in the mail If you have any lab test that is abnormal or we need to change your treatment, we will call you to review the results.   Testing/Procedures:  None ordered.   Follow-Up: At [facility], you and your health needs  are our priority.  As part of our continuing mission to provide you with exceptional heart care, we have created designated Provider Care Teams.  These Care Teams include your primary Cardiologist (physician) and Advanced  Practice Providers (APPs -  Physician Assistants and Nurse Practitioners) who all work together to provide you with the care you need, when you need it.  We recommend signing up for the patient portal called "MyChart".  Sign up information is provided on this After Visit Summary.  MyChart is used to connect with patients for Virtual Visits (Telemedicine).  Patients are able to view lab/test results, encounter notes, upcoming appointments, etc.  Non-urgent messages can be sent to your provider as well.   To learn more about what you can do with MyChart, go to [email or web address].    Your next appointment:   6 month(s)  Provider:   Donato Schultz, MD     Signed, Levi Aland, NP  07/16/2022 9:04 AM    [facility]

## 2022-07-16 ENCOUNTER — Encounter: Payer: Self-pay | Admitting: Nurse Practitioner

## 2022-07-16 ENCOUNTER — Ambulatory Visit: Payer: Medicare Other | Attending: Nurse Practitioner | Admitting: Nurse Practitioner

## 2022-07-16 VITALS — BP 110/58 | HR 58 | Ht 67.0 in | Wt 164.2 lb

## 2022-07-16 DIAGNOSIS — I350 Nonrheumatic aortic (valve) stenosis: Secondary | ICD-10-CM | POA: Diagnosis not present

## 2022-07-16 DIAGNOSIS — I2510 Atherosclerotic heart disease of native coronary artery without angina pectoris: Secondary | ICD-10-CM | POA: Diagnosis not present

## 2022-07-16 DIAGNOSIS — Z952 Presence of prosthetic heart valve: Secondary | ICD-10-CM

## 2022-07-16 DIAGNOSIS — I6523 Occlusion and stenosis of bilateral carotid arteries: Secondary | ICD-10-CM

## 2022-07-16 DIAGNOSIS — E785 Hyperlipidemia, unspecified: Secondary | ICD-10-CM | POA: Diagnosis not present

## 2022-07-16 DIAGNOSIS — I10 Essential (primary) hypertension: Secondary | ICD-10-CM

## 2022-07-16 NOTE — Patient Instructions (Signed)
Medication Instructions:   Your physician recommends that you continue on your current medications as directed. Please refer to the Current Medication list given to you today.   *If you need a refill on your cardiac medications before your next appointment, please call your pharmacy*   Lab Work:  None ordered.  If you have labs (blood work) drawn today and your tests are completely normal, you will receive your results only by: MyChart Message (if you have MyChart) OR A paper copy in the mail If you have any lab test that is abnormal or we need to change your treatment, we will call you to review the results.   Testing/Procedures:  None ordered.   Follow-Up: At [facility], you and your health needs are our priority.  As part of our continuing mission to provide you with exceptional heart care, we have created designated Provider Care Teams.  These Care Teams include your primary Cardiologist (physician) and Advanced Practice Providers (APPs -  Physician Assistants and Nurse Practitioners) who all work together to provide you with the care you need, when you need it.  We recommend signing up for the patient portal called "MyChart".  Sign up information is provided on this After Visit Summary.  MyChart is used to connect with patients for Virtual Visits (Telemedicine).  Patients are able to view lab/test results, encounter notes, upcoming appointments, etc.  Non-urgent messages can be sent to your provider as well.   To learn more about what you can do with MyChart, go to [email or web address].    Your next appointment:   6 month(s)  Provider:   Mark Skains, MD     

## 2022-07-27 ENCOUNTER — Ambulatory Visit (INDEPENDENT_AMBULATORY_CARE_PROVIDER_SITE_OTHER): Payer: Medicare Other | Admitting: Nurse Practitioner

## 2022-07-27 ENCOUNTER — Telehealth: Payer: Self-pay

## 2022-07-27 ENCOUNTER — Encounter: Payer: Self-pay | Admitting: Nurse Practitioner

## 2022-07-27 DIAGNOSIS — R739 Hyperglycemia, unspecified: Secondary | ICD-10-CM | POA: Insufficient documentation

## 2022-07-27 DIAGNOSIS — Z0000 Encounter for general adult medical examination without abnormal findings: Secondary | ICD-10-CM | POA: Diagnosis not present

## 2022-07-27 MED ORDER — ALBUTEROL SULFATE HFA 108 (90 BASE) MCG/ACT IN AERS: 1.0000 | INHALATION_SPRAY | Freq: Four times a day (QID) | RESPIRATORY_TRACT | 11 refills | Status: DC | PRN

## 2022-07-27 NOTE — Progress Notes (Signed)
   This service is provided via telemedicine  No vital signs collected/recorded due to the encounter was a telemedicine visit.   Location of patient (ex: home, work):  Home  Patient consents to a telephone visit: Yes, see telephone visit dated 07/27/22  Location of the provider (ex: office, home):  [facility], Office   Name of any referring provider:  N/A  Names of all persons participating in the telemedicine service and their role in the encounter:  S.Chrae B/CMA, Abbey Chatters, NP, and Patient   Time spent on call:  7 min with medical assistant

## 2022-07-27 NOTE — Progress Notes (Signed)
Subjective:   Albert Hayes is a 77 y.o. male who presents for Medicare Annual/Subsequent preventive examination.  Review of Systems     Cardiac Risk Factors include: advanced age (>77men, >9 women);hypertension;dyslipidemia;male gender     Objective:    There were no vitals filed for this visit. There is no height or weight on file to calculate BMI.     07/27/2022   11:20 AM 07/04/2022   11:05 AM 06/01/2022    7:49 AM 05/19/2019    8:58 AM 03/19/2019    8:56 AM 11/10/2018   10:17 AM 11/15/2017    8:00 PM  Advanced Directives  Does Patient Have a Medical Advance Directive? Yes Yes Yes No No Yes No  Type of Estate agent of [place];Living will;Out of facility DNR (pink MOST or yellow form) Healthcare Power of [place];Living will;Out of facility DNR (pink MOST or yellow form)    Healthcare Power of [place];Living will   Does patient want to make changes to medical advance directive? No - Patient declined No - Patient declined    No - Patient declined No - Patient declined  Copy of Healthcare Power of Attorney in Chart? No - copy requested No - copy requested    No - copy requested   Would patient like information on creating a medical advance directive?      No - Patient declined No - Patient declined    Current Medications (verified) Outpatient Encounter Medications as of 07/27/2022  Medication Sig   amLODipine (NORVASC) 10 MG tablet Take 10 mg by mouth daily.   aspirin EC 81 MG tablet Take 81 mg by mouth daily.   Azelastine HCl (ASTEPRO) 0.15 % SOLN Place 1 spray into the nose at bedtime.   budesonide (PULMICORT) 1 MG/2ML nebulizer solution Take 1 mg by nebulization as needed.   carvedilol (COREG) 6.25 MG tablet Take 1 tablet (6.25 mg total) by mouth 2 (two) times daily.   clindamycin (CLEOCIN) 150 MG capsule Take 150 mg by mouth once. 2 tablets 2 hours prior to Dental Procedures   clopidogrel (PLAVIX) 75 MG tablet Take 75 mg by mouth daily.    cyanocobalamin (VITAMIN B12) 1000 MCG tablet Take 1,000 mcg by mouth daily.   diclofenac Sodium (VOLTAREN) 1 % GEL Apply topically 4 (four) times daily.   donepezil (ARICEPT) 5 MG tablet Take 1 tablet (5 mg total) by mouth at bedtime.   Evolocumab (REPATHA SURECLICK) 140 MG/ML SOAJ Inject 140 mg into the skin every 14 (fourteen) days.   ezetimibe (ZETIA) 10 MG tablet TAKE 1 TABLET BY MOUTH DAILY   Fluticasone-Umeclidin-Vilant (TRELEGY ELLIPTA) 100-62.5-25 MCG/ACT AEPB Inhale 1 puff into the lungs daily.   memantine (NAMENDA) 5 MG tablet Take 1 tablet (5 mg total) by mouth at bedtime.   ramipril (ALTACE) 10 MG capsule Take 10 mg by mouth daily.   [DISCONTINUED] albuterol (VENTOLIN HFA) 108 (90 Base) MCG/ACT inhaler 1-2 puffs every 6 (six) hours as needed.   albuterol (VENTOLIN HFA) 108 (90 Base) MCG/ACT inhaler Inhale 1-2 puffs into the lungs every 6 (six) hours as needed.   No facility-administered encounter medications on file as of 07/27/2022.    Allergies (verified) Penicillins, Xanax [alprazolam], Atorvastatin, Bupropion, Citalopram, Cymbalta [duloxetine hcl], Duloxetine, Fluoxetine, Fluvoxamine, Lexapro [escitalopram oxalate], Niacin and related, Oxcarbazepine, Pravastatin, Quetiapine, Seroquel [quetiapine fumarate], Statins, Tamsulosin, and Zoloft [sertraline hcl]   History: Past Medical History:  Diagnosis Date   Arteriosclerosis of coronary artery 12/22/2014   Asthma  Benign prostatic hyperplasia without lower urinary tract symptoms 09/13/2012   Cervical spondylosis 02/27/2013   Chronic idiopathic constipation 11/02/2015   Chronic obstructive pulmonary disease 12/22/2014   Coronary artery disease    DDD (degenerative disc disease), cervical 02/27/2013   DDD (degenerative disc disease), lumbosacral 02/27/2013   Dysphagia 10/18/2016   ED (erectile dysfunction)    Generalized anxiety disorder 01/21/2013   GERD (gastroesophageal reflux disease)    Heart murmur    EJECTION  MURMUR   HLD (hyperlipidemia)    Hx of colon polyps    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   Thrombocytopenia    Vitamin D deficiency    Past Surgical History:  Procedure Laterality Date   ADENOIDECTOMY  2017   ANOMALOUS PULMONARY VENOUS RETURN REPAIR, TOTAL  2021   CAROTID STENT  2007   CAROTID STENT  2001   CATARACT EXTRACTION Bilateral 2017   COLONOSCOPY WITH PROPOFOL N/A 10/31/2016   Procedure: COLONOSCOPY WITH PROPOFOL;  Surgeon: Scot Jun, MD;  Location[facility] END[facility]COPY;  Service: Endoscopy;  Laterality: N/A;   CORONARY ANGIOPLASTY     CORONARY ARTERY BYPASS GRAFT  2006   triple   ESOPHAGOGASTRODUODENOSCOPY  2016   LUMBAR FUSION  2001   SEPTOPLASTY     TONSILLECTOMY     Family History  Problem Relation Age of Onset   Headache Mother    Cancer Father        Lung cancer   Parkinson's disease Paternal Grandmother        Unknown if paternal or maternal side of family   Alzheimer's disease Paternal Grandfather        Unknown if paternal or maternal side of family   Social History   Socioeconomic History   Marital status: Married    Spouse name: Bonita Quin   Number of children: 3   Years of education: 16   Highest education level: Not on file  Occupational History   Occupation: Retired Surveyor, minerals  Tobacco Use   Smoking status: Former    Packs/day: 1.00    Years: 30.00    Additional pack years: 0.00    Total pack years: 30.00    Types: Cigarettes    Quit date: 10/30/1988    Years since quitting: 33.7   Smokeless tobacco: Never  Vaping Use   Vaping  Use: Never used  Substance and Sexual Activity   Alcohol use: No    Alcohol/week: 0.0 standard drinks of alcohol   Drug use: No   Sexual activity: Not on file  Other Topics Concern   Not on file  Social History Narrative   Married. Education: high school/other. Exercise: Yes.      Patient is right-handed.   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     Social Determinants of Health   Financial Resource Strain: Not on file  Food Insecurity: Not on file  Transportation Needs: Not on file  Physical Activity: Not  on file  Stress: Not on file  Social Connections: Not on file    Tobacco Counseling Counseling given: Not Answered   Clinical Intake:  Pre-visit preparation completed: Yes  Pain : No/denies pain     BMI - recorded: 25 Nutritional Risks: None Diabetes: No  How often do you need to have someone help you when you read instructions, pamphlets, or other written materials from your doctor or pharmacy?: 1 - Never  Diabetic?no         Activities of Daily Living    07/27/2022   11:28 AM  In your present state of health, do you have any difficulty performing the following activities:  Hearing? 0  Vision? 0  Difficulty concentrating or making decisions? 1  Walking or climbing stairs? 1  Comment issues with going up and down stairs  Dressing or bathing? 0  Doing errands, shopping? 0  Preparing Food and eating ? N  Using the Toilet? N  In the past six months, have you accidently leaked urine? N  Do you have problems with loss of bowel control? N  Managing your Medications? N  Managing your Finances? N  Housekeeping or managing your Housekeeping? N    Patient Care Team: Sharon Seller, NP as PCP - General (Geriatric Medicine) Jake Bathe, MD as PCP - Cardiology (Cardiology) Lewis Moccasin, MD as Attending Physician (Emergency Medicine) Drema Dallas,  DO as Consulting Physician (Neurology) Gilda Crease, Latina Craver, MD (Vascular Surgery) Elwyn Reach (Neurology) [facility], [place]  Indicate any recent Medical Services you may have received from other than [facility] providers in the past year (date may be approximate).     Assessment:   This is a routine wellness examination for Albert Hayes.  Hearing/Vision screen Hearing Screening - Comments:: Wears hearing aid  Vision Screening - Comments:: Last eye exam greater than 12 months ago. Pending appointment in 4-6 weeks with Dr.Oman  Dietary issues and exercise activities discussed: Current Exercise Habits: Home exercise routine, Time (Minutes): 60, Frequency (Times/Week): 3, Weekly Exercise (Minutes/Week): 180   Goals Addressed   None   Depression Screen    07/27/2022   11:19 AM 07/04/2022   11:02 AM 10/14/2015    8:59 AM 05/25/2015    1:16 PM 02/14/2015    1:52 PM  PHQ 2/9 Scores  PHQ - 2 Score 0 0 0 0 0    Fall Risk    07/27/2022   11:18 AM 07/04/2022   11:02 AM 06/01/2022    7:45 AM 05/19/2019    8:58 AM 03/19/2019    8:56 AM  Fall Risk   Falls in the past year? 0 0 0 0 0  Number falls in past yr: 0 0 0 0   Injury with Fall? 0 0 0 0   Risk for fall due to : No Fall Risks No Fall Risks     Follow up Falls evaluation completed Falls evaluation completed Falls evaluation completed      FALL RISK PREVENTION PERTAINING TO THE HOME:  Any stairs in or around the home? No  If so, are there any without handrails? No  Home free of loose throw rugs in walkways, pet beds, electrical cords, etc? Yes  Adequate lighting in your home to reduce risk of falls? Yes   ASSISTIVE DEVICES UTILIZED TO PREVENT FALLS:  Life alert? No  Use of a cane, walker or w/c? No  Grab bars in the bathroom? No  Shower chair or bench  in shower? No  Elevated toilet seat or a handicapped toilet? No   TIMED UP AND GO:  Was the test performed? No .    Cognitive Function:      06/01/2022   10:00 AM   Montreal Cognitive Assessment   Visuospatial/ Executive (0/5) 4  Naming (0/3) 3  Attention: Read list of digits (0/2) 2  Attention: Read list of letters (0/1) 1  Attention: Serial 7 subtraction starting at 100 (0/3) 3  Language: Repeat phrase (0/2) 1  Language : Fluency (0/1) 1  Abstraction (0/2) 1  Delayed Recall (0/5) 4  Orientation (0/6) 6  Total 26  Adjusted Score (based on education) 26      07/04/2022   11:04 AM  6CIT Screen  What Year? 0 points  What month? 0 points  What time? 0 points  Count back from 20 0 points  Months in reverse 0 points  Repeat phrase 4 points  Total Score 4 points    Immunizations Immunization History  Administered Date(s) Administered   COVID-19, mRNA, vaccine(Comirnaty)12 years and older 03/13/2022   Influenza, High Dose Seasonal PF 12/08/2014, 01/03/2016, 11/28/2016, 11/25/2018, 12/11/2019, 01/22/2022   Influenza,inj,quad, With Preservative 02/11/2018   Influenza-Unspecified 12/15/2013, 12/08/2014, 02/04/2015, 01/03/2016, 12/11/2019, 01/02/2021   PFIZER Comirnaty(Gray Top)Covid-19 Tri-Sucrose Vaccine 05/23/2019, 06/14/2019, 01/10/2020   PFIZER(Purple Top)SARS-COV-2 Vaccination 05/23/2019, 06/14/2019   PNEUMOCOCCAL CONJUGATE-20 01/22/2022   Pneumococcal Conjugate,unspecified 04/20/2014   Pneumococcal Conjugate-13 04/20/2012, 04/20/2014   Pneumococcal Polysaccharide-23 02/22/2020   Pneumococcal-Unspecified 01/07/2014   Rsv, Bivalent, Protein Subunit Rsvpref,pf Verdis Frederickson) 01/22/2022   Tdap 04/09/2008, 04/20/2014   Zoster Recombinat (Shingrix) 08/09/2016, 12/20/2016   Zoster, Live 06/07/2013, 08/09/2016    TDAP status: Up to date  Flu Vaccine status: Up to date  Pneumococcal vaccine status: Up to date  Covid-19 vaccine status: Information provided on how to obtain vaccines.   Qualifies for Shingles Vaccine? Yes   Zostavax completed No   Shingrix Completed?: Yes  Screening Tests Health Maintenance  Topic Date Due   INFLUENZA  VACCINE  11/08/2022   Medicare Annual Wellness (AWV)  07/27/2023   DTaP/Tdap/Td (3 - Td or Tdap) 04/20/2024   Pneumonia Vaccine 59+ Years old  Completed   COVID-19 Vaccine  Completed   Hepatitis C Screening  Completed   Zoster Vaccines- Shingrix  Completed   HPV VACCINES  Aged Out   COLONOSCOPY (Pts 45-43yrs Insurance coverage will need to be confirmed)  Discontinued    Health Maintenance  There are no preventive care reminders to display for this patient.   Colorectal cancer screening: No longer required.   Lung Cancer Screening: (Low Dose CT Chest recommended if Age 23-80 years, 30 pack-year currently smoking OR have quit w/in 15years.) does not qualify.   Lung Cancer Screening Referral: na  Additional Screening:  Hepatitis C Screening: does qualify; Completed 2017  Vision Screening: Recommended annual ophthalmology exams for early detection of glaucoma and other disorders of the eye. Is the patient up to date with their annual eye exam?  Yes  Who is the provider or what is the name of the office in which the patient attends annual eye exams? [place] If pt is not established with a provider, would they like to be referred to a provider to establish care? No .   Dental Screening: Recommended annual dental exams for proper oral hygiene  Community Resource Referral / Chronic Care Management: CRR required this visit?  No   CCM required this visit?  No      Plan:  I have personally reviewed and noted the following in the patient's chart:   Medical and social history Use of alcohol, tobacco or illicit drugs  Current medications and supplements including opioid prescriptions. Patient is not currently taking opioid prescriptions. Functional ability and status Nutritional status Physical activity Advanced directives List of other physicians Hospitalizations, surgeries, and ER visits in previous 12 months Vitals Screenings to include cognitive, depression, and  falls Referrals and appointments  In addition, I have reviewed and discussed with patient certain preventive protocols, quality metrics, and best practice recommendations. A written personalized care plan for preventive services as well as general preventive health recommendations were provided to patient.     Sharon Seller, NP   07/27/2022   Virtual Visit via Video Note  I connected with Albert Hayes on 07/27/22 at 11:20 AM EDT by a video enabled telemedicine application and verified that I am speaking with the correct person using two identifiers.  Location: Patient: home Provider: video   I discussed the limitations of evaluation and management by telemedicine and the availability of in person appointments. The patient expressed understanding and agreed to proceed.    I discussed the assessment and treatment plan with the patient. The patient was provided an opportunity to ask questions and all were answered. The patient agreed with the plan and demonstrated an understanding of the instructions.   The patient was advised to call back or seek an in-person evaluation if the symptoms worsen or if the condition fails to improve as anticipated.  I provided 15 minutes of non-face-to-face time during this encounter.  Janene Harvey. Janyth Contes, AGNP Avs printed and mailed.

## 2022-07-27 NOTE — Telephone Encounter (Signed)
Mr. Albert Hayes, Albert Hayes are scheduled for a virtual visit with your provider today.    Just as we do with appointments in the office, we must obtain your consent to participate.  Your consent will be active for this visit and any virtual visit you may have with one of our providers in the next 365 days.    If you have a MyChart account, I can also send a copy of this consent to you electronically.  All virtual visits are billed to your insurance company just like a traditional visit in the office.  As this is a virtual visit, video technology does not allow for your provider to perform a traditional examination.  This may limit your provider's ability to fully assess your condition.  If your provider identifies any concerns that need to be evaluated in person or the need to arrange testing such as labs, EKG, etc, we will make arrangements to do so.    Although advances in technology are sophisticated, we cannot ensure that it will always work on either your end or our end.  If the connection with a video visit is poor, we may have to switch to a telephone visit.  With either a video or telephone visit, we are not always able to ensure that we have a secure connection.   I need to obtain your verbal consent now.   Are you willing to proceed with your visit today?   Albert Hayes has provided verbal consent on 07/27/2022 for a virtual visit (video or telephone).   [name], [place] 07/27/2022  11:22 AM

## 2022-07-27 NOTE — Patient Instructions (Signed)
  Albert Hayes , Thank you for taking time to come for your Medicare Wellness Visit. I appreciate your ongoing commitment to your health goals. Please review the following plan we discussed and let me know if I can assist you in the future.   This is a list of the screening recommended for you and due dates:  Health Maintenance  Topic Date Due   Flu Shot  11/08/2022   Medicare Annual Wellness Visit  07/27/2023   DTaP/Tdap/Td vaccine (3 - Td or Tdap) 04/20/2024   Pneumonia Vaccine  Completed   COVID-19 Vaccine  Completed   Hepatitis C Screening: USPSTF Recommendation to screen - Ages 24-79 yo.  Completed   Zoster (Shingles) Vaccine  Completed   HPV Vaccine  Aged Out   Colon Cancer Screening  Discontinued

## 2022-07-30 ENCOUNTER — Encounter: Payer: Self-pay | Admitting: Nurse Practitioner

## 2022-07-30 ENCOUNTER — Ambulatory Visit (INDEPENDENT_AMBULATORY_CARE_PROVIDER_SITE_OTHER): Payer: Medicare Other | Admitting: Nurse Practitioner

## 2022-07-30 VITALS — BP 124/62 | HR 57 | Temp 97.9°F | Ht 67.0 in | Wt 163.8 lb

## 2022-07-30 DIAGNOSIS — R001 Bradycardia, unspecified: Secondary | ICD-10-CM

## 2022-07-30 DIAGNOSIS — F01A0 Vascular dementia, mild, without behavioral disturbance, psychotic disturbance, mood disturbance, and anxiety: Secondary | ICD-10-CM | POA: Diagnosis not present

## 2022-07-30 MED ORDER — MEMANTINE HCL 5 MG PO TABS: 5.0000 mg | ORAL_TABLET | Freq: Two times a day (BID) | ORAL | 3 refills | Status: DC

## 2022-07-30 NOTE — Progress Notes (Signed)
Careteam: Patient Care Team: Sharon Seller, NP as PCP - General (Geriatric Medicine) Jake Bathe, MD as PCP - Cardiology (Cardiology) Lewis Moccasin, MD as Attending Physician (Emergency Medicine) Drema Dallas, DO as Consulting Physician (Neurology) Gilda Crease, Latina Craver, MD (Vascular Surgery) Elwyn Reach (Neurology) [facility], [place]  PLACE OF SERVICE:  [facility]  Advanced Directive information    Allergies  Allergen Reactions   Penicillins Other (See Comments)    Unknown childhood allergic reaction - severe Has patient had a PCN reaction causing immediate rash, facial/tongue/throat swelling, SOB or lightheadedness with hypotension: Unknown Has patient had a PCN reaction causing severe rash involving mucus membranes or skin necrosis: Unknown Has patient had a PCN reaction that required hospitalization: Yes Has patient had a PCN reaction occurring within the last 10 years: No If all of the above answers are "NO", then may proceed with Cephalosporin use.   Xanax [Alprazolam] Shortness Of Breath   Atorvastatin Other (See Comments)    Difficulty breathing    Bupropion Other (See Comments)    Unknown reaction   Citalopram Other (See Comments)    Unknown reaction   Cymbalta [Duloxetine Hcl] Other (See Comments)    Unknown reaction   Duloxetine Other (See Comments)   Fluoxetine Other (See Comments)    Unknown reaction   Fluvoxamine Other (See Comments)    Unknown reaction   Lexapro [Escitalopram Oxalate] Other (See Comments)    Unknown reaction   Niacin And Related Other (See Comments)    Unknown reaction   Oxcarbazepine Other (See Comments)    Unknown reaction   Pravastatin Other (See Comments)    Unknown reaction   Quetiapine Other (See Comments)   Seroquel [Quetiapine Fumarate] Other (See Comments)    Unknown reaction   Statins Other (See Comments)    Unknown adverse reaction   Tamsulosin Other (See Comments)    Unknown reaction    Zoloft [Sertraline Hcl] Other (See Comments)    Unknown reaction    Chief Complaint  Patient presents with   Medication Management    Discuss medications and low pulse     HPI: Patient is a 77 y.o. male for medication questions on his aricept and namenda.  He is having symptoms of numbness and tingling in head occurred.  Starts in the back of the head and goes to the front of the head into face/lips. When he eats food with medication it has decreased from twice daily to twice weekly.  He does not want to stop medication because they have helped his functional status.  He is questioning if he stops aricept and increase namenda.  Started Mediterranean diet   Review of Systems:  Review of Systems  Constitutional:  Negative for chills, fever and weight loss.  HENT:  Negative for tinnitus.   Respiratory:  Negative for cough, sputum production and shortness of breath.   Cardiovascular:  Negative for chest pain, palpitations and leg swelling.  Gastrointestinal:  Negative for abdominal pain, constipation, diarrhea and heartburn.  Genitourinary:  Negative for dysuria, frequency and urgency.  Musculoskeletal:  Negative for back pain, falls, joint pain and myalgias.  Skin: Negative.   Neurological:  Positive for tingling. Negative for dizziness and headaches.  Psychiatric/Behavioral:  Negative for depression and memory loss. The patient does not have insomnia.    Past Medical History:  Diagnosis Date   Arteriosclerosis of coronary artery 12/22/2014   Asthma    Benign prostatic hyperplasia without  lower urinary tract symptoms 09/13/2012   Cervical spondylosis 02/27/2013   Chronic idiopathic constipation 11/02/2015   Chronic obstructive pulmonary disease 12/22/2014   Coronary artery disease    DDD (degenerative disc disease), cervical 02/27/2013   DDD (degenerative disc disease), lumbosacral 02/27/2013   Dysphagia 10/18/2016   ED (erectile dysfunction)    Generalized anxiety  disorder 01/21/2013   GERD (gastroesophageal reflux disease)    Heart murmur    EJECTION MURMUR   HLD (hyperlipidemia)    Hx of colon polyps    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   Thrombocytopenia    Vitamin D deficiency    Past Surgical History:  Procedure Laterality Date   ADENOIDECTOMY  2017   ANOMALOUS PULMONARY VENOUS RETURN REPAIR, TOTAL  2021   CAROTID STENT  2007   CAROTID STENT  2001   CATARACT EXTRACTION Bilateral 2017   COLONOSCOPY WITH PROPOFOL N/A 10/31/2016   Procedure: COLONOSCOPY WITH PROPOFOL;  Surgeon: Scot Jun, MD;  Location: [facility] ENDOSCOPY;  Service: Endoscopy;  Laterality: N/A;   CORONARY ANGIOPLASTY     CORONARY ARTERY BYPASS GRAFT  2006   triple   ESOPHAGOGASTRODUODENOSCOPY  2016   LUMBAR FUSION  2001   SEPTOPLASTY     TONSILLECTOMY     Social History:   reports that he quit smoking about 33 years ago. His smoking use included cigarettes. He has a 30.00 pack-year smoking history. He has never used smokeless tobacco. He reports that he does not drink alcohol and does not use drugs.  Family History  Problem Relation Age of Onset   Headache Mother    Cancer Father        Lung cancer   Parkinson's disease Paternal Grandmother        Unknown if paternal or maternal side of family   Alzheimer's disease Paternal Grandfather        Unknown if paternal or maternal side of family    Medications: Patient's Medications  New Prescriptions   No medications on file  Previous Medications   ALBUTEROL (VENTOLIN HFA) 108 (90 BASE) MCG/ACT INHALER     Inhale 1-2 puffs into the lungs every 6 (six) hours as needed.   AMLODIPINE (NORVASC) 10 MG TABLET    Take 10 mg by mouth daily.   ASPIRIN EC 81 MG TABLET    Take 81 mg by mouth daily.   AZELASTINE HCL (ASTEPRO) 0.15 % SOLN    Place 1 spray into the nose at bedtime.   BUDESONIDE (PULMICORT) 1 MG/2ML NEBULIZER SOLUTION    Take 1 mg by nebulization as needed.   CARVEDILOL (COREG) 6.25 MG TABLET    Take 1 tablet (6.25 mg total) by mouth 2 (two) times daily.   CLINDAMYCIN (CLEOCIN) 150 MG CAPSULE    Take 150 mg by mouth once. 2 tablets 2 hours prior to Dental Procedures   CLOPIDOGREL (PLAVIX) 75 MG TABLET    Take 75 mg by mouth daily.   CYANOCOBALAMIN (VITAMIN B12) 1000 MCG TABLET    Take 1,000 mcg by mouth daily.   DICLOFENAC SODIUM (  VOLTAREN) 1 % GEL    Apply topically 4 (four) times daily.   DONEPEZIL (ARICEPT) 5 MG TABLET    Take 1 tablet (5 mg total) by mouth at bedtime.   EVOLOCUMAB (REPATHA SURECLICK) 140 MG/ML SOAJ    Inject 140 mg into the skin every 14 (fourteen) days.   EZETIMIBE (ZETIA) 10 MG TABLET    TAKE 1 TABLET BY MOUTH DAILY   FLUTICASONE-UMECLIDIN-VILANT (TRELEGY ELLIPTA) 100-62.5-25 MCG/ACT AEPB    Inhale 1 puff into the lungs daily.   MEMANTINE (NAMENDA) 5 MG TABLET    Take 1 tablet (5 mg total) by mouth at bedtime.   RAMIPRIL (ALTACE) 10 MG CAPSULE    Take 10 mg by mouth daily.  Modified Medications   No medications on file  Discontinued Medications   No medications on file    Physical Exam:  Vitals:   07/30/22 1448  BP: 124/62  Pulse: (!) 57  Temp: 97.9 F (36.6 C)  TempSrc: Temporal  SpO2: 98%  Weight: 163 lb 12.8 oz (74.3 kg)  Height: 5\' 7"  (1.702 m)   Body mass index is 25.65 kg/m. Wt Readings from Last 3 Encounters:  07/30/22 163 lb 12.8 oz (74.3 kg)  07/16/22 164 lb 3.2 oz (74.5 kg)  07/05/22 164 lb 3.2 oz (74.5 kg)    Physical Exam Constitutional:      General: He is not in acute distress.    Appearance: He is well-developed. He is not  diaphoretic.  HENT:     Head: Normocephalic and atraumatic.     Right Ear: External ear normal.     Left Ear: External ear normal.     Mouth/Throat:     Pharynx: No oropharyngeal exudate.  Eyes:     Conjunctiva/sclera: Conjunctivae normal.     Pupils: Pupils are equal, round, and reactive to light.  Cardiovascular:     Rate and Rhythm: Normal rate and regular rhythm.     Heart sounds: Normal heart sounds.  Pulmonary:     Effort: Pulmonary effort is normal.     Breath sounds: Normal breath sounds.  Abdominal:     General: Bowel sounds are normal.     Palpations: Abdomen is soft.  Musculoskeletal:        General: No tenderness.     Cervical back: Normal range of motion and neck supple.     Right lower leg: No edema.     Left lower leg: No edema.  Skin:    General: Skin is warm and dry.  Neurological:     Mental Status: He is alert and oriented to person, place, and time.     Labs reviewed: Basic Metabolic Panel: Recent Labs    06/19/22 0809  NA 140  K 4.8  CL 101  CO2 26  GLUCOSE 102*  BUN 13  CREATININE 0.75  CALCIUM 9.0   Liver Function Tests: Recent Labs    06/19/22 0809  AST 16  ALT 14  BILITOT 0.7  PROT 6.8   No results for input(s): "LIPASE", "AMYLASE" in the last 8760 hours. No results for input(s): "AMMONIA" in the last 8760 hours. CBC: Recent Labs    06/19/22 0809  WBC 7.2  NEUTROABS 4,385  HGB 15.6  HCT 46.0  MCV 88.8  PLT 218   Lipid Panel: Recent Labs    06/19/22 0809  CHOL 101  HDL 49  LDLCALC 35  TRIG 91  CHOLHDL 2.1   TSH: No results for input(s): "TSH" in the  last 8760 hours. A1C: Lab Results  Component Value Date   HGBA1C 6.1 (H) 06/19/2022     Assessment/Plan 1. Mild vascular dementia without behavioral disturbance, psychotic disturbance, mood disturbance, or anxiety -question if side effects from aricept, will stop at this time and increase namenda.  -encouraged to continue to stay active, healthy eating habits  encouraced.  - memantine (NAMENDA) 5 MG tablet; Take 1 tablet (5 mg total) by mouth 2 (two) times daily.  Dispense: 60 tablet; Refill: 3  2. Bradycardia Mild, no symptoms likely from aricept, will stop at this time and monitor.   Keep scheduled follow up   Desmund Elman K. Biagio Borg [facility] [phone]

## 2022-07-30 NOTE — Patient Instructions (Addendum)
Stop DONEPEZIL Increase MEMANTINE to 5 mg twice daily (one in am and one in PM)   RECOMMENDATIONS FOR ALL PATIENTS WITH MEMORY PROBLEMS: 1. Continue to exercise (Recommend 30 minutes of walking everyday, or 3 hours every week) 2. Increase social interactions - continue going to [place] and enjoy social gatherings with friends and family 3. Eat healthy, avoid fried foods and eat more fruits and vegetables 4. Maintain adequate blood pressure, blood sugar, and blood cholesterol level. Reducing the risk of stroke and cardiovascular disease also helps promoting better memory. 5. Avoid stressful situations. Live a simple life and avoid aggravations. 6. Organize your time and prepare for the next day in anticipation. 7. Sleep well, avoid any interruptions of sleep and avoid any distractions in the bedroom that may interfere with adequate sleep quality 8. Avoid sugar, avoid sweets as there is a strong link between excessive sugar intake, diabetes, and cognitive impairment 9. Mediterranean diet, which has been shown to help patients reduce the risk of progressive memory disorders and reduces cardiovascular risk. This includes eating fish, eat fruits and green leafy vegetables, nuts like almonds and hazelnuts, walnuts, and also use olive oil. Avoid fast foods and fried foods as much as possible. Avoid sweets and sugar as sugar use has been linked to worsening of memory function.

## 2022-08-27 ENCOUNTER — Ambulatory Visit (INDEPENDENT_AMBULATORY_CARE_PROVIDER_SITE_OTHER): Payer: Medicare Other

## 2022-08-27 ENCOUNTER — Ambulatory Visit (INDEPENDENT_AMBULATORY_CARE_PROVIDER_SITE_OTHER): Payer: Medicare Other | Admitting: Vascular Surgery

## 2022-08-27 ENCOUNTER — Encounter (INDEPENDENT_AMBULATORY_CARE_PROVIDER_SITE_OTHER): Payer: Self-pay | Admitting: Vascular Surgery

## 2022-08-27 VITALS — BP 123/62 | HR 51 | Resp 18 | Ht 67.0 in | Wt 164.8 lb

## 2022-08-27 DIAGNOSIS — I6529 Occlusion and stenosis of unspecified carotid artery: Secondary | ICD-10-CM

## 2022-08-27 DIAGNOSIS — I6521 Occlusion and stenosis of right carotid artery: Secondary | ICD-10-CM | POA: Diagnosis not present

## 2022-08-27 DIAGNOSIS — I25119 Atherosclerotic heart disease of native coronary artery with unspecified angina pectoris: Secondary | ICD-10-CM | POA: Diagnosis not present

## 2022-08-27 DIAGNOSIS — J449 Chronic obstructive pulmonary disease, unspecified: Secondary | ICD-10-CM | POA: Diagnosis not present

## 2022-08-27 DIAGNOSIS — I10 Essential (primary) hypertension: Secondary | ICD-10-CM | POA: Diagnosis not present

## 2022-08-27 DIAGNOSIS — E785 Hyperlipidemia, unspecified: Secondary | ICD-10-CM

## 2022-08-27 NOTE — Progress Notes (Signed)
MRN : [id number]  Albert Hayes is a 77 y.o. (1946/03/30) male who presents with chief complaint of check carotid arteries.  History of Present Illness:   The patient is seen for evaluation of carotid stenosis. The carotid stenosis was identified remotely.   The patient denies amaurosis fugax. There is no recent history of TIA symptoms or focal motor deficits. There is no prior documented CVA.   There is no history of migraine headaches. There is no history of seizures.   The patient is taking enteric-coated aspirin 81 mg daily.   No recent shortening of the patient's walking distance or new symptoms consistent with claudication.  No history of rest pain symptoms. No new ulcers or wounds of the lower extremities have occurred.   There is no history of DVT, PE or superficial thrombophlebitis. No recent episodes of angina or shortness of breath documented.     Duplex ultrasound done here today shows improvement in the appearance of the right internal carotid artery now reported as some thickening with no plaque visualized.  The left internal carotid artery appears to have resolved the injury associated with TAVR placement and is 1 to 39%.  This appears to be a slight improvement over the last year study which was reported as 1-39% bilateral ICA stenosis  No outpatient medications have been marked as taking for the 08/27/22 encounter (Appointment) with Gilda Crease, Latina Craver, MD.    Past Medical History:  Diagnosis Date   Arteriosclerosis of coronary artery 12/22/2014   Asthma    Benign prostatic hyperplasia without lower urinary tract symptoms 09/13/2012   Cervical spondylosis 02/27/2013   Chronic idiopathic constipation 11/02/2015   Chronic obstructive pulmonary disease 12/22/2014   Coronary artery disease    DDD (degenerative disc disease), cervical 02/27/2013   DDD (degenerative disc disease), lumbosacral 02/27/2013   Dysphagia 10/18/2016   ED (erectile  dysfunction)    Generalized anxiety disorder 01/21/2013   GERD (gastroesophageal reflux disease)    Heart murmur    EJECTION MURMUR   HLD (hyperlipidemia)    Hx of colon polyps    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   Stenosis of carotid artery 11/20/2017   Added automatically from request for surgery [id number]   Stroke (cerebrum) (HCC) 11/15/2017   Thrombocytopenia (HCC)    Vitamin D deficiency     Past Surgical History:  Procedure Laterality Date   ADENOIDECTOMY  2017   ANOMALOUS PULMONARY VENOUS RETURN REPAIR, TOTAL  2021   CAROTID STENT  2007   CAROTID STENT  2001   CATARACT EXTRACTION Bilateral 2017   COLONOSCOPY WITH PROPOFOL N/A 10/31/2016   Procedure: COLONOSCOPY WITH PROPOFOL;  Surgeon: Scot Jun, MD;  Location: [facility] ENDOSCOPY;  Service: Endoscopy;  Laterality: N/A;   CORONARY ANGIOPLASTY     CORONARY ARTERY BYPASS GRAFT  2006   triple   ESOPHAGOGASTRODUODENOSCOPY  2016   LUMBAR FUSION  2001   SEPTOPLASTY     TONSILLECTOMY      Social History Social History   Tobacco Use   Smoking status: Former  Packs/day: 1.00    Years: 30.00    Additional pack years: 0.00    Total pack years: 30.00    Types: Cigarettes    Quit date: 10/30/1988    Years since quitting: 33.8   Smokeless tobacco: Never  Vaping Use   Vaping Use: Never used  Substance Use Topics   Alcohol use: No    Alcohol/week: 0.0 standard drinks of alcohol   Drug use: No    Family History Family History  Problem Relation Age of Onset   Headache Mother    Cancer Father        Lung cancer   Parkinson's disease Paternal Grandmother        Unknown if paternal or maternal side of family   Alzheimer's disease  Paternal Grandfather        Unknown if paternal or maternal side of family    Allergies  Allergen Reactions   Penicillins Other (See Comments)    Unknown childhood allergic reaction - severe Has patient had a PCN reaction causing immediate rash, facial/tongue/throat swelling, SOB or lightheadedness with hypotension: Unknown Has patient had a PCN reaction causing severe rash involving mucus membranes or skin necrosis: Unknown Has patient had a PCN reaction that required hospitalization: Yes Has patient had a PCN reaction occurring within the last 10 years: No If all of the above answers are "NO", then may proceed with Cephalosporin use.   Xanax [Alprazolam] Shortness Of Breath   Atorvastatin Other (See Comments)    Difficulty breathing    Bupropion Other (See Comments)    Unknown reaction   Citalopram Other (See Comments)    Unknown reaction   Cymbalta [Duloxetine Hcl] Other (See Comments)    Unknown reaction   Duloxetine Other (See Comments)   Fluoxetine Other (See Comments)    Unknown reaction   Fluvoxamine Other (See Comments)    Unknown reaction   Lexapro [Escitalopram Oxalate] Other (See Comments)    Unknown reaction   Niacin And Related Other (See Comments)    Unknown reaction   Oxcarbazepine Other (See Comments)    Unknown reaction   Pravastatin Other (See Comments)    Unknown reaction   Quetiapine Other (See Comments)   Seroquel [Quetiapine Fumarate] Other (See Comments)    Unknown reaction   Statins Other (See Comments)    Unknown adverse reaction   Tamsulosin Other (See Comments)    Unknown reaction   Zoloft [Sertraline Hcl] Other (See Comments)    Unknown reaction     REVIEW OF SYSTEMS (Negative unless checked)  Constitutional: [] Weight loss  [] Fever  [] Chills Cardiac: [] Chest pain   [] Chest pressure   [] Palpitations   [] Shortness of breath when laying flat   [] Shortness of breath with exertion. Vascular:  [x] Pain in legs with walking   [] Pain in legs at  rest  [] History of DVT   [] Phlebitis   [] Swelling in legs   [] Varicose veins   [] Non-healing ulcers Pulmonary:   [] Uses home oxygen   [] Productive cough   [] Hemoptysis   [] Wheeze  [x] COPD   [] Asthma Neurologic:  [] Dizziness   [] Seizures   [] History of stroke   [] History of TIA  [] Aphasia   [] Vissual changes   [] Weakness or numbness in arm   [] Weakness or numbness in leg Musculoskeletal:   [] Joint swelling   [x] Joint pain   [x] Low back pain Hematologic:  [] Easy bruising  [] Easy bleeding   [] Hypercoagulable state   [] Anemic Gastrointestinal:  [] Diarrhea   [] Vomiting  [x] Gastroesophageal reflux/heartburn   []   Difficulty swallowing. Genitourinary:  [] Chronic kidney disease   [] Difficult urination  [] Frequent urination   [] Blood in urine Skin:  [] Rashes   [] Ulcers  Psychological:  [] History of anxiety   []  History of major depression.  Physical Examination  There were no vitals filed for this visit. There is no height or weight on file to calculate BMI. Gen: WD/WN, NAD Head: [place]/AT, No temporalis wasting.  Ear/Nose/Throat: Hearing grossly intact, nares w/o erythema or drainage Eyes: PER, EOMI, sclera nonicteric.  Neck: Supple, no masses.  No bruit or JVD.  Pulmonary:  Good air movement, no audible wheezing, no use of accessory muscles.  Cardiac: RRR, normal S1, S2, no Murmurs. Vascular:   No carotid bruit noted Vessel Right Left  Radial Palpable Palpable  Carotid  Palpable  Palpable  Subclav  Palpable Palpable  Gastrointestinal: soft, non-distended. No guarding/no peritoneal signs.  Musculoskeletal: M/S 5/5 throughout.  No visible deformity.  Neurologic: CN 2-12 intact. Pain and light touch intact in extremities.  Symmetrical.  Speech is fluent. Motor exam as listed above. Psychiatric: Judgment intact, Mood & affect appropriate for pt's clinical situation. Dermatologic: No rashes or ulcers noted.  No changes consistent with cellulitis.   CBC Lab Results  Component Value Date   WBC 7.2  06/19/2022   HGB 15.6 06/19/2022   HCT 46.0 06/19/2022   MCV 88.8 06/19/2022   PLT 218 06/19/2022    BMET    Component Value Date/Time   NA 140 06/19/2022 0809   K 4.8 06/19/2022 0809   CL 101 06/19/2022 0809   CO2 26 06/19/2022 0809   GLUCOSE 102 (H) 06/19/2022 0809   BUN 13 06/19/2022 0809   CREATININE 0.75 06/19/2022 0809   CALCIUM 9.0 06/19/2022 0809   GFRNONAA >60 11/15/2017 1135   GFRNONAA 87 10/14/2015 1012   GFRAA >60 11/15/2017 1135   GFRAA >89 10/14/2015 1012   CrCl cannot be calculated (Patient's most recent lab result is older than the maximum 21 days allowed.).  COAG Lab Results  Component Value Date   INR 0.96 11/15/2017    Radiology No results found.   Assessment/Plan 1. Stenosis of right carotid artery Recommend:  Given the patient's asymptomatic subcritical stenosis no further invasive testing or surgery at this time.  Duplex ultrasound done here today shows improvement in the appearance of the right internal carotid artery now reported as some thickening with no plaque visualized.  The left internal carotid artery appears to have resolved the injury associated with TAVR placement and is 1 to 39%.  This appears to be a slight improvement over the last year study which was reported as 1-39% bilateral ICA stenosis  Continue antiplatelet therapy as prescribed Continue management of CAD, HTN and Hyperlipidemia Healthy heart diet,  encouraged exercise at least 4 times per week Follow up in 12 months with duplex ultrasound and physical exam  - VAS US CAROTID; Future  2. Coronary artery disease involving native coronary artery of native heart with angina pectoris (HCC) Continue cardiac and antihypertensive medications as already ordered and reviewed, no changes at this time.  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  He has discussed this with Manas asked me to contact his  cardiologist.  Continue Repatha as ordered and reviewed, no changes at this time  Nitrates PRN for chest pain  3. Primary hypertension Continue antihypertensive medications as already ordered, these medications have been reviewed and  there are no changes at this time.  4. Chronic obstructive pulmonary disease, unspecified COPD type (HCC) Continue pulmonary medications and aerosols as already ordered, these medications have been reviewed and there are no changes at this time.   5. Hyperlipidemia, unspecified hyperlipidemia type Continue statin as ordered and reviewed, no changes at this time    Levora Dredge, MD  08/27/2022 9:33 AM

## 2022-08-28 ENCOUNTER — Other Ambulatory Visit: Payer: Self-pay | Admitting: Cardiology

## 2022-09-10 ENCOUNTER — Ambulatory Visit (INDEPENDENT_AMBULATORY_CARE_PROVIDER_SITE_OTHER): Payer: Medicare Other | Admitting: Nurse Practitioner

## 2022-09-10 ENCOUNTER — Encounter: Payer: Self-pay | Admitting: Nurse Practitioner

## 2022-09-10 VITALS — BP 110/80 | HR 50 | Temp 97.2°F | Resp 16 | Ht 67.0 in | Wt 163.0 lb

## 2022-09-10 DIAGNOSIS — E538 Deficiency of other specified B group vitamins: Secondary | ICD-10-CM | POA: Diagnosis not present

## 2022-09-10 DIAGNOSIS — J449 Chronic obstructive pulmonary disease, unspecified: Secondary | ICD-10-CM | POA: Diagnosis not present

## 2022-09-10 DIAGNOSIS — R001 Bradycardia, unspecified: Secondary | ICD-10-CM | POA: Diagnosis not present

## 2022-09-10 DIAGNOSIS — F01A0 Vascular dementia, mild, without behavioral disturbance, psychotic disturbance, mood disturbance, and anxiety: Secondary | ICD-10-CM

## 2022-09-10 DIAGNOSIS — R739 Hyperglycemia, unspecified: Secondary | ICD-10-CM | POA: Diagnosis not present

## 2022-09-10 DIAGNOSIS — E785 Hyperlipidemia, unspecified: Secondary | ICD-10-CM | POA: Diagnosis not present

## 2022-09-10 NOTE — Progress Notes (Signed)
Careteam: Patient Care Team: Sharon Seller, NP as PCP - General (Geriatric Medicine) Jake Bathe, MD as PCP - Cardiology (Cardiology) Lewis Moccasin, MD as Attending Physician (Emergency Medicine) Drema Dallas, DO as Consulting Physician (Neurology) Schnier, Latina Craver, MD (Vascular Surgery) Elwyn Reach (Neurology) [facility], [place]  PLACE OF SERVICE:  [facility]  Advanced Directive information Does Patient Have a Medical Advance Directive?: Yes, Type of Advance Directive: Healthcare Power of [place];Living will;Out of facility DNR (pink MOST or yellow form), Does patient want to make changes to medical advance directive?: No - Patient declined  Allergies  Allergen Reactions   Penicillins Other (See Comments)    Unknown childhood allergic reaction - severe Has patient had a PCN reaction causing immediate rash, facial/tongue/throat swelling, SOB or lightheadedness with hypotension: Unknown Has patient had a PCN reaction causing severe rash involving mucus membranes or skin necrosis: Unknown Has patient had a PCN reaction that required hospitalization: Yes Has patient had a PCN reaction occurring within the last 10 years: No If all of the above answers are "NO", then may proceed with Cephalosporin use.   Xanax [Alprazolam] Shortness Of Breath   Aricept [Donepezil Hcl]    Atorvastatin Other (See Comments)    Difficulty breathing    Bupropion Other (See Comments)    Unknown reaction   Citalopram Other (See Comments)    Unknown reaction   Cymbalta [Duloxetine Hcl] Other (See Comments)    Unknown reaction   Duloxetine Other (See Comments)   Fluoxetine Other (See Comments)    Unknown reaction   Fluvoxamine Other (See Comments)    Unknown reaction   Lexapro [Escitalopram Oxalate] Other (See Comments)    Unknown reaction   Niacin And Related Other (See Comments)    Unknown reaction   Oxcarbazepine Other (See Comments)    Unknown reaction    Pravastatin Other (See Comments)    Unknown reaction   Quetiapine Other (See Comments)   Seroquel [Quetiapine Fumarate] Other (See Comments)    Unknown reaction   Statins Other (See Comments)    Unknown adverse reaction   Tamsulosin Other (See Comments)    Unknown reaction   Zoloft [Sertraline Hcl] Other (See Comments)    Unknown reaction    Chief Complaint  Patient presents with   Medical Management of Chronic Issues    3 month follow up.      HPI: Patient is a 77 y.o. male for routine follow up.   He discontinued the aricept and has continued the namenda 10 mg daily. Reports the 10 mg daily has made his thinking better.  He still struggles with short term memory loss. He is journaling to help keep track of task.   Hyperlipidemia- has been starting on rapatha with great results.  Continues on zetia 10 mg daily   COPD- continues on trelegy.   Htn- blood pressure well controlled on ramipril.    Review of Systems:  Review of Systems  Constitutional:  Negative for chills, fever and weight loss.  HENT:  Negative for tinnitus.   Respiratory:  Negative for cough, sputum production and shortness of breath.   Cardiovascular:  Negative for chest pain, palpitations and leg swelling.  Gastrointestinal:  Negative for abdominal pain, constipation, diarrhea and heartburn.  Genitourinary:  Negative for dysuria, frequency and urgency.  Musculoskeletal:  Negative for back pain, falls, joint pain and myalgias.  Skin: Negative.   Neurological:  Negative for dizziness and headaches.  Psychiatric/Behavioral:  Positive for memory loss. Negative for depression. The patient does not have insomnia.     Past Medical History:  Diagnosis Date   Arteriosclerosis of coronary artery 12/22/2014   Asthma    Benign prostatic hyperplasia without lower urinary tract symptoms 09/13/2012   Cervical spondylosis 02/27/2013   Chronic idiopathic constipation 11/02/2015   Chronic obstructive pulmonary  disease 12/22/2014   Coronary artery disease    DDD (degenerative disc disease), cervical 02/27/2013   DDD (degenerative disc disease), lumbosacral 02/27/2013   Dysphagia 10/18/2016   ED (erectile dysfunction)    Generalized anxiety disorder 01/21/2013   GERD (gastroesophageal reflux disease)    Heart murmur    EJECTION MURMUR   HLD (hyperlipidemia)    Hx of colon polyps    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   Thrombocytopenia (HCC)    Vitamin D deficiency    Past Surgical History:  Procedure Laterality Date   ADENOIDECTOMY  2017   ANOMALOUS PULMONARY VENOUS RETURN REPAIR, TOTAL  2021   CAROTID STENT  2007   CAROTID STENT  2001   CATARACT EXTRACTION Bilateral 2017   COLONOSCOPY WITH PROPOFOL N/A 10/31/2016   Procedure: COLONOSCOPY WITH PROPOFOL;  Surgeon: Scot Jun, MD;  [facility] END[facility]COPY;  Service: Endoscopy;  Laterality: N/A;   CORONARY ANGIOPLASTY     CORONARY ARTERY BYPASS GRAFT  2006   triple   ESOPHAGOGASTRODUODENOSCOPY  2016   LUMBAR FUSION  2001   SEPTOPLASTY     TONSILLECTOMY     Social History:   reports that he quit smoking about 33 years ago. His smoking use included cigarettes. He has a 30.00 pack-year smoking history. He has never used smokeless tobacco. He reports that he does not drink alcohol and does not use drugs.  Family History  Problem Relation Age of Onset   Headache Mother    Cancer Father        Lung cancer   Parkinson's disease Paternal Grandmother        Unknown if paternal or maternal side of family   Alzheimer's  disease Paternal Grandfather        Unknown if paternal or maternal side of family    Medications: Patient's Medications  New Prescriptions   No medications on file  Previous Medications   ALBUTEROL (VENTOLIN HFA) 108 (90 BASE) MCG/ACT INHALER    Inhale 1-2 puffs into the lungs every 6 (six) hours as needed.   AMLODIPINE (NORVASC) 10 MG TABLET    TAKE 1 TABLET BY MOUTH EVERY EVENING.   ASPIRIN EC 81 MG TABLET    Take 81 mg by mouth daily.   AZELASTINE HCL (ASTEPRO) 0.15 % SOLN    Place 1 spray into the nose at bedtime.   BUDESONIDE (PULMICORT) 1 MG/2ML NEBULIZER SOLUTION    Take 1 mg by nebulization as needed.   CARVEDILOL (COREG) 6.25 MG TABLET    Take 1 tablet (6.25 mg total) by mouth 2 (two) times daily.   CLINDAMYCIN (CLEOCIN) 150 MG CAPSULE    Take 150 mg by mouth once.  2 tablets 2 hours prior to Dental Procedures   CLOPIDOGREL (PLAVIX) 75 MG TABLET    Take 75 mg by mouth daily.   CYANOCOBALAMIN (VITAMIN B12) 1000 MCG TABLET    Take 1,000 mcg by mouth daily.   DICLOFENAC SODIUM (VOLTAREN) 1 % GEL    Apply topically 4 (four) times daily.   EVOLOCUMAB (REPATHA SURECLICK) 140 MG/ML SOAJ    Inject 140 mg into the skin every 14 (fourteen) days.   EZETIMIBE (ZETIA) 10 MG TABLET    TAKE 1 TABLET BY MOUTH DAILY   FLUTICASONE-UMECLIDIN-VILANT (TRELEGY ELLIPTA) 100-62.5-25 MCG/ACT AEPB    Inhale 1 puff into the lungs daily.   MEMANTINE (NAMENDA) 10 MG TABLET    Take 10 mg by mouth daily.   RAMIPRIL (ALTACE) 10 MG CAPSULE    Take 10 mg by mouth daily.  Modified Medications   No medications on file  Discontinued Medications   MEMANTINE (NAMENDA) 5 MG TABLET    Take 1 tablet (5 mg total) by mouth 2 (two) times daily.    Physical Exam:  Vitals:   09/10/22 0944 09/10/22 1000  BP: 110/80   Pulse: (!) 49 (!) 50  Resp: 16   Temp: (!) 97.2 F (36.2 C)   SpO2: 96%   Weight: 163 lb (73.9 kg)   Height: 5\' 7"  (1.702 m)    Body mass index is 25.53 kg/m. Wt Readings from Last 3 Encounters:   09/10/22 163 lb (73.9 kg)  08/27/22 164 lb 12.8 oz (74.8 kg)  07/30/22 163 lb 12.8 oz (74.3 kg)    Physical Exam Constitutional:      General: He is not in acute distress.    Appearance: He is well-developed. He is not diaphoretic.  HENT:     Head: Normocephalic and atraumatic.     Right Ear: External ear normal.     Left Ear: External ear normal.     Mouth/Throat:     Pharynx: No oropharyngeal exudate.  Eyes:     Conjunctiva/sclera: Conjunctivae normal.     Pupils: Pupils are equal, round, and reactive to light.  Cardiovascular:     Rate and Rhythm: Normal rate and regular rhythm.     Heart sounds: Normal heart sounds.  Pulmonary:     Effort: Pulmonary effort is normal.     Breath sounds: Normal breath sounds.  Abdominal:     General: Bowel sounds are normal.     Palpations: Abdomen is soft.  Musculoskeletal:        General: No tenderness.     Cervical back: Normal range of motion and neck supple.     Right lower leg: No edema.     Left lower leg: No edema.  Skin:    General: Skin is warm and dry.  Neurological:     Mental Status: He is alert and oriented to person, place, and time.     Labs reviewed: Basic Metabolic Panel: Recent Labs    06/19/22 0809  NA 140  K 4.8  CL 101  CO2 26  GLUCOSE 102*  BUN 13  CREATININE 0.75  CALCIUM 9.0   Liver Function Tests: Recent Labs    06/19/22 0809  AST 16  ALT 14  BILITOT 0.7  PROT 6.8   No results for input(s): "LIPASE", "AMYLASE" in the last 8760 hours. No results for input(s): "AMMONIA" in the last 8760 hours. CBC: Recent Labs    06/19/22 0809  WBC 7.2  NEUTROABS 4,385  HGB 15.6  HCT 46.0  MCV 88.8  PLT 218   Lipid Panel: Recent Labs    06/19/22 0809  CHOL 101  HDL 49  LDLCALC 35  TRIG 91  CHOLHDL 2.1   TSH: No results for input(s): "TSH" in the last 8760 hours. A1C: Lab Results  Component Value Date   HGBA1C 6.1 (H) 06/19/2022     Assessment/Plan 1. Mild vascular dementia  without behavioral disturbance, psychotic disturbance, mood disturbance, or anxiety (HCC) Improved symptoms and decrease in side effects on namenda 10 mg daily. Has stopped aricept and doing better. Continue supportive care.   2. Hyperglycemia -continue dietary modifications   3. Vitamin B12 deficiency -continues supplement  4. Hyperlipidemia, unspecified hyperlipidemia type -continues on rapatha with great results.   5. Stage 3 severe COPD by GOLD classification (HCC) -well controlled on trelegy.  6. Bradycardia -asymptomatic, likely from coreg will have him monitor HR at home and notify if staying <50.   Return in about 4 months (around 01/10/2023) for routine follow up.  Janene Harvey. Biagio Borg [facility] [phone]

## 2022-09-10 NOTE — Patient Instructions (Addendum)
Three times a week- record HR, notify if HR <50 on multiple checks.

## 2022-09-11 ENCOUNTER — Encounter: Payer: Self-pay | Admitting: Nurse Practitioner

## 2022-09-11 MED ORDER — CARVEDILOL 3.125 MG PO TABS: 3.1250 mg | ORAL_TABLET | Freq: Two times a day (BID) | ORAL | 1 refills | Status: DC

## 2022-09-11 NOTE — Telephone Encounter (Signed)
Pts HR was low in office so he was monitoring at home and remained low, decreased carvedilol to 3.125 BID at this time. Seeing to Dr Anne Fu for [facility].

## 2022-09-19 ENCOUNTER — Encounter: Payer: Self-pay | Admitting: Nurse Practitioner

## 2022-10-24 ENCOUNTER — Other Ambulatory Visit (HOSPITAL_COMMUNITY): Payer: Self-pay

## 2022-10-24 ENCOUNTER — Telehealth: Payer: Self-pay

## 2022-10-24 NOTE — Telephone Encounter (Signed)
Pharmacy Patient Advocate Encounter   Received notification from CoverMyMeds that prior authorization for REPATHA is required/requested.   Insurance verification completed.   The patient is insured through [facility] .   Per test claim: PA submitted to [facility] via CoverMyMeds Key/confirmation #/EOC [id number]     Status is pending

## 2022-10-25 NOTE — Telephone Encounter (Signed)
Pharmacy Patient Advocate Encounter  Received notification from [facility] that Prior Authorization for REPATHA has been APPROVED from 1.1.24 to 12.31.24.

## 2022-10-31 ENCOUNTER — Other Ambulatory Visit: Payer: Self-pay | Admitting: Cardiology

## 2022-11-07 ENCOUNTER — Other Ambulatory Visit: Payer: Self-pay | Admitting: Cardiology

## 2022-11-29 ENCOUNTER — Other Ambulatory Visit: Payer: Self-pay | Admitting: Nurse Practitioner

## 2022-11-29 ENCOUNTER — Other Ambulatory Visit: Payer: Self-pay | Admitting: Cardiology

## 2022-11-29 DIAGNOSIS — F01A0 Vascular dementia, mild, without behavioral disturbance, psychotic disturbance, mood disturbance, and anxiety: Secondary | ICD-10-CM

## 2022-11-29 MED ORDER — MEMANTINE HCL 10 MG PO TABS: 10.0000 mg | ORAL_TABLET | Freq: Every day | ORAL | 1 refills | Status: DC

## 2022-11-29 NOTE — Telephone Encounter (Signed)
 Patient medication has Allergy Contraindications.

## 2022-12-04 ENCOUNTER — Encounter: Payer: Self-pay | Admitting: Physician Assistant

## 2022-12-04 ENCOUNTER — Ambulatory Visit: Payer: Medicare Other | Admitting: Physician Assistant

## 2022-12-04 VITALS — BP 168/60 | HR 64 | Resp 18 | Ht 67.0 in | Wt 158.0 lb

## 2022-12-04 DIAGNOSIS — G3184 Mild cognitive impairment, so stated: Secondary | ICD-10-CM | POA: Diagnosis not present

## 2022-12-04 NOTE — Patient Instructions (Addendum)
It was a pleasure to see you today at our office.   Recommendations:  Neurocognitive evaluation at our office scheduled for Oct 2024  Start memantine 10 mg daily , side effects discussed  Follow up in 6 months   Whom to call:  Memory  decline, memory medications: Call our office [phone]   For psychiatric meds, mood meds: Please have your primary care physician manage these medications.     For assessment of decision of mental capacity and competency:  Call Dr. Erick Blinks, geriatric psychiatrist at [phone]  For guidance in geriatric dementia issues please call [facility] [phone]   If you have any severe symptoms of a stroke, or other severe issues such as confusion,severe chills or fever, etc call 911 or go to the ER as you may need to be evaluated further         RECOMMENDATIONS FOR ALL PATIENTS WITH MEMORY PROBLEMS: 1. Continue to exercise (Recommend 30 minutes of walking everyday, or 3 hours every week) 2. Increase social interactions - continue going to [place] and enjoy social gatherings with friends and family 3. Eat healthy, avoid fried foods and eat more fruits and vegetables 4. Maintain adequate blood pressure, blood sugar, and blood cholesterol level. Reducing the risk of stroke and cardiovascular disease also helps promoting better memory. 5. Avoid stressful situations. Live a simple life and avoid aggravations. Organize your time and prepare for the next day in anticipation. 6. Sleep well, avoid any interruptions of sleep and avoid any distractions in the bedroom that may interfere with adequate sleep quality 7. Avoid sugar, avoid sweets as there is a strong link between excessive sugar intake, diabetes, and cognitive impairment We discussed the Mediterranean diet, which has been shown to help patients reduce the risk of progressive memory disorders and reduces cardiovascular risk. This includes eating fish, eat fruits and green leafy  vegetables, nuts like almonds and hazelnuts, walnuts, and also use olive oil. Avoid fast foods and fried foods as much as possible. Avoid sweets and sugar as sugar use has been linked to worsening of memory function.  There is always a concern of gradual progression of memory problems. If this is the case, then we may need to adjust level of care according to patient needs. Support, both to the patient and caregiver, should then be put into place.      You have been referred for a neuropsychological evaluation (i.e., evaluation of memory and thinking abilities). Please bring someone with you to this appointment if possible, as it is helpful for the doctor to hear from both you and another adult who knows you well. Please bring eyeglasses and hearing aids if you wear them.    The evaluation will take approximately 3 hours and has two parts:   The first part is a clinical interview with the neuropsychologist (Dr. Milbert Coulter or Dr. Roseanne Reno). During the interview, the neuropsychologist will speak with you and the individual you brought to the appointment.    The second part of the evaluation is testing with the doctor's technician Annabelle Harman or Selena Batten). During the testing, the technician will ask you to remember different types of material, solve problems, and answer some questionnaires. Your family member will not be present for this portion of the evaluation.   Please note: We must reserve several hours of the neuropsychologist's time and the psychometrician's time for your evaluation appointment. As such, there is a No-Show fee of $100. If you are unable to attend any of your  appointments, please contact our office as soon as possible to reschedule.    FALL PRECAUTIONS: Be cautious when walking. Scan the area for obstacles that may increase the risk of trips and falls. When getting up in the mornings, sit up at the edge of the bed for a few minutes before getting out of bed. Consider elevating the bed at the head  end to avoid drop of blood pressure when getting up. Walk always in a well-lit room (use night lights in the walls). Avoid area rugs or power cords from appliances in the middle of the walkways. Use a walker or a cane if necessary and consider physical therapy for balance exercise. Get your eyesight checked regularly.  FINANCIAL OVERSIGHT: Supervision, especially oversight when making financial decisions or transactions is also recommended.  HOME SAFETY: Consider the safety of the kitchen when operating appliances like stoves, microwave oven, and blender. Consider having supervision and share cooking responsibilities until no longer able to participate in those. Accidents with firearms and other hazards in the house should be identified and addressed as well.   ABILITY TO BE LEFT ALONE: If patient is unable to contact 911 operator, consider using LifeLine, or when the need is there, arrange for someone to stay with patients. Smoking is a fire hazard, consider supervision or cessation. Risk of wandering should be assessed by caregiver and if detected at any point, supervision and safe proof recommendations should be instituted.  MEDICATION SUPERVISION: Inability to self-administer medication needs to be constantly addressed. Implement a mechanism to ensure safe administration of the medications.   DRIVING: Regarding driving, in patients with progressive memory problems, driving will be impaired. We advise to have someone else do the driving if trouble finding directions or if minor accidents are reported. Independent driving assessment is available to determine safety of driving.   If you are interested in the driving assessment, you can contact the following:  [employer] in [place] [phone]  [facility] [phone]  [facility] [phone] or [phone]    Mediterranean Diet A Mediterranean diet refers to  food and lifestyle choices that are based on the traditions of countries located on the [employer]. This way of eating has been shown to help prevent certain conditions and improve outcomes for people who have chronic diseases, like kidney disease and heart disease. What are tips for following this plan? Lifestyle  Cook and eat meals together with your family, when possible. Drink enough fluid to keep your urine clear or pale yellow. Be physically active every day. This includes: Aerobic exercise like running or swimming. Leisure activities like gardening, walking, or housework. Get 7-8 hours of sleep each night. If recommended by your health care provider, drink red wine in moderation. This means 1 glass a day for nonpregnant women and 2 glasses a day for men. A glass of wine equals 5 oz (150 mL). Reading food labels  Check the serving size of packaged foods. For foods such as rice and pasta, the serving size refers to the amount of cooked product, not dry. Check the total fat in packaged foods. Avoid foods that have saturated fat or trans fats. Check the ingredients list for added sugars, such as corn syrup. Shopping  At the grocery store, buy most of your food from the areas near the walls of the store. This includes: Fresh fruits and vegetables (produce). Grains, beans, nuts, and seeds. Some of these may be available in unpackaged forms or  large amounts (in bulk). Fresh seafood. Poultry and eggs. Low-fat dairy products. Buy whole ingredients instead of prepackaged foods. Buy fresh fruits and vegetables in-season from local farmers markets. Buy frozen fruits and vegetables in resealable bags. If you do not have access to quality fresh seafood, buy precooked frozen shrimp or canned fish, such as tuna, salmon, or sardines. Buy small amounts of raw or cooked vegetables, salads, or olives from the deli or salad bar at your store. Stock your pantry so you always have certain foods on  hand, such as olive oil, canned tuna, canned tomatoes, rice, pasta, and beans. Cooking  Cook foods with extra-virgin olive oil instead of using butter or other vegetable oils. Have meat as a side dish, and have vegetables or grains as your main dish. This means having meat in small portions or adding small amounts of meat to foods like pasta or stew. Use beans or vegetables instead of meat in common dishes like chili or lasagna. Experiment with different cooking methods. Try roasting or broiling vegetables instead of steaming or sauteing them. Add frozen vegetables to soups, stews, pasta, or rice. Add nuts or seeds for added healthy fat at each meal. You can add these to yogurt, salads, or vegetable dishes. Marinate fish or vegetables using olive oil, lemon juice, garlic, and fresh herbs. Meal planning  Plan to eat 1 vegetarian meal one day each week. Try to work up to 2 vegetarian meals, if possible. Eat seafood 2 or more times a week. Have healthy snacks readily available, such as: Vegetable sticks with hummus. Greek yogurt. Fruit and nut trail mix. Eat balanced meals throughout the week. This includes: Fruit: 2-3 servings a day Vegetables: 4-5 servings a day Low-fat dairy: 2 servings a day Fish, poultry, or lean meat: 1 serving a day Beans and legumes: 2 or more servings a week Nuts and seeds: 1-2 servings a day Whole grains: 6-8 servings a day Extra-virgin olive oil: 3-4 servings a day Limit red meat and sweets to only a few servings a month What are my food choices? Mediterranean diet Recommended Grains: Whole-grain pasta. Brown rice. Bulgar wheat. Polenta. Couscous. Whole-wheat bread. Orpah Cobb. Vegetables: Artichokes. Beets. Broccoli. Cabbage. Carrots. Eggplant. Green beans. Chard. Kale. Spinach. Onions. Leeks. Peas. Squash. Tomatoes. Peppers. Radishes. Fruits: Apples. Apricots. Avocado. Berries. Bananas. Cherries. Dates. Figs. Grapes. Lemons. Melon. Oranges. Peaches.  Plums. Pomegranate. Meats and other protein foods: Beans. Almonds. Sunflower seeds. Pine nuts. Peanuts. Cod. Salmon. Scallops. Shrimp. Tuna. Tilapia. Clams. Oysters. Eggs. Dairy: Low-fat milk. Cheese. Greek yogurt. Beverages: Water. Red wine. Herbal tea. Fats and oils: Extra virgin olive oil. Avocado oil. Grape seed oil. Sweets and desserts: [place] yogurt with honey. Baked apples. Poached pears. Trail mix. Seasoning and other foods: Basil. Cilantro. Coriander. Cumin. Mint. Parsley. Sage. Rosemary. Tarragon. Garlic. Oregano. Thyme. Pepper. Balsalmic vinegar. Tahini. Hummus. Tomato sauce. Olives. Mushrooms. Limit these Grains: Prepackaged pasta or rice dishes. Prepackaged cereal with added sugar. Vegetables: Deep fried potatoes (french fries). Fruits: Fruit canned in syrup. Meats and other protein foods: Beef. Pork. Lamb. Poultry with skin. Hot dogs. Tomasa Blase. Dairy: Ice cream. Sour cream. Whole milk. Beverages: Juice. Sugar-sweetened soft drinks. Beer. Liquor and spirits. Fats and oils: Butter. Canola oil. Vegetable oil. Beef fat (tallow). Lard. Sweets and desserts: Cookies. Cakes. Pies. Candy. Seasoning and other foods: Mayonnaise. Premade sauces and marinades. The items listed may not be a complete list. Talk with your dietitian about what dietary choices are right for you. Summary The Mediterranean diet includes both food  and lifestyle choices. Eat a variety of fresh fruits and vegetables, beans, nuts, seeds, and whole grains. Limit the amount of red meat and sweets that you eat. Talk with your health care provider about whether it is safe for you to drink red wine in moderation. This means 1 glass a day for nonpregnant women and 2 glasses a day for men. A glass of wine equals 5 oz (150 mL). This information is not intended to replace advice given to you by your health care provider. Make sure you discuss any questions you have with your health care provider. Document Released: 11/17/2015  Document Revised: 12/20/2015 Document Reviewed: 11/17/2015 Elsevier Interactive Patient Education  2017 [employer].

## 2022-12-04 NOTE — Progress Notes (Signed)
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 RH male with extensive medical history including CAD, carotid artery disease s/p repair, hypertension, hyperlipidemia, COPD, history of CVA in 2019, DDD, vitamin D deficiency,  anxiety, multiple allergies to medications, presenting today in follow-up for evaluation of memory loss. Patient is on memantine 10 mg nightly.  Donepezil was discontinued in April 2024 due to increased bradycardia.  Memory is stable.  MMSE today 30/30.  He continues to be independent with his ADLs, and continues to drive without any issues.  He lives a healthy lifestyle and remains active.   Recommendations:   Follow up in  6 months. Continue memantine 10 mg daily side effects discussed. Continue B1 supplement. Neuropsych testing for clarity of diagnosis and disease trajectory is scheduled for 01/2023  Continue Plavix and aspirin for secondary stroke prevention Recommend good control of cardiovascular risk factors Continue to control mood as per PCP    Subjective:   This patient is here alone.  Previous records as well as any outside records available were reviewed prior to todays visit.   Patient was last seen on 06/01/2022 with a MoCA of 26/30     Any changes in memory since last visit? "Same". Patient has some difficulty remembering recent conversations and people names  "when I think about it comes". 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  He plays solitary daily, as well as brain challenging activities on his iPhone.  He uses Journaling, Glass blower/designer and notes for personal management. repeats oneself?  Denies  Disoriented when walking into a room?  Patient denies   Leaving objects in unusual places?  Patient denies   Wandering behavior?   denies   Any personality  changes since last visit?   denies   Any worsening depression?: denies   Hallucinations or paranoia?  denies   Seizures?   denies    Any sleep changes? Sleeps well. Reports vivid dreams but does not reenact, denies sleepwalking   Sleep apnea?   denies    Any hygiene concerns?   denies   Independent of bathing and dressing?  Endorsed  Does the patient needs help with medications? Patient is in charge.  He is now taking Repatha for lipid management with good results.  He follows closely with cardiology. Who is in charge of the finances?  Patient is in charge     Any changes in appetite?  Denies.  Recently started Mediterranean diet    Patient have trouble swallowing?  denies   Does the patient cook? Yes  Any kitchen accidents such as leaving the stove on?   denies   Any headaches?    denies   Vision changes? denies Chronic pain?  denies   Ambulates with difficulty?  Denies."When wether permits, I engage in outdoor activities such as walking for 2-3 hrs 3-4 x a week", and daily housework.  Recent falls or head injuries?  denies      Unilateral weakness, numbness or tingling?   denies   Any tremors?  Denies.   Any anosmia?    Denies.   Any incontinence of urine?  Denies.   Any bowel dysfunction?  Denies.      Patient lives with wife Does the patient drive? Yes, denies getting lost   MRI of the brain 12/27/2021, personally reviewed remarkable for mild  chronic small vessel ischemic changes within the cerebral white matter and pons similar to prior MRI in 2021, subcentimeter chronic infarct within the left cerebellar hemisphere, new since prior MRI in 2021, mild generalized cerebral atrophy, no acute findings.    Initial visit February 2024    How long did patient have memory difficulties? Around July 2021 became evident.  He has trouble expressing himself, sometimes he scrambles his thoughts.  He needs to use a calendar and keep notes to remember things such as going to the grocery store.  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 He is now on donepezil the he feels originally it was helping, "but it is starting to decrease again". " I could not remember going to the building, the issues are coming back " repeats oneself? Denies Disoriented when walking into a room?  Patient denies   Leaving objects in unusual places? denies ." I am very organized "  Wandering behavior?  denies   Any personality changes ?  Patient denies  Any worsening depression?:  Patient denies   Hallucinations or paranoia?  Patient denies   Seizures?   Patient denies    Any sleep changes?  Sleeps well. Denies vivid dreams, REM behavior or sleepwalking   Sleep apnea? He denies diagnosis at present time   Any hygiene concerns?  Patient denies   Independent of bathing and dressing?  Endorsed  Does the patient needs help with medications? Patient  is in charge  Who is in charge of the finances?  Wife is in charge (she has always handled it)    Any changes in appetite?   denies    Patient have trouble swallowing?   denies   Does the patient cook? Yes    Any kitchen accidents such as leaving the stove on? Patient denies   Any headaches?   denies   Chronic back pain  denies   Ambulates with difficulty?    denies   Recent falls or head injuries?   denies     Unilateral weakness, numbness or tingling?  denies   Any tremors?   denies   Any anosmia? "I cannot answer that question, some days is different" Any incontinence of urine? denies   Any bowel dysfunction?    denies      Patient lives  wife History of heavy alcohol intake? denies   History of heavy tobacco use?  denies   Family history of dementia?  Endorsed, in maternal grandfather.  Grandmother had Parkinson's disease. Does patient drive? Yes, no issues    Pertinent labs October and November 2023 B12 619, A1c 6.2, folate 15.4, TSH September 2023 4.6,   04/27/2019 NEUROPSYCHOLOGICAL EVALUATION:   Mild neurocognitive disorder.  He exhibited deficits in attention/concentration, executive functioning and verbal fluency.  Multiple etiologies are possible.  Vascular etiology most likely but also considered were undetected seizure activity or frontotemporal dementia.   Past Medical History:  Diagnosis Date   Arteriosclerosis of coronary artery 12/22/2014   Asthma    Benign prostatic hyperplasia without lower urinary tract symptoms 09/13/2012   Cervical spondylosis 02/27/2013   Chronic idiopathic constipation 11/02/2015   Chronic obstructive pulmonary disease 12/22/2014   Coronary artery disease    DDD (degenerative disc disease), cervical 02/27/2013   DDD (degenerative disc disease), lumbosacral 02/27/2013   Dysphagia 10/18/2016   ED (erectile dysfunction)    Generalized anxiety disorder 01/21/2013   GERD (gastroesophageal reflux disease)    Heart murmur  EJECTION MURMUR   HLD (hyperlipidemia)    Hx of colon polyps    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   Stenosis of carotid artery 11/20/2017   Added automatically from request for surgery [id number]   Stroke (cerebrum) (HCC) 11/15/2017   Thrombocytopenia (HCC)    Vitamin D deficiency      Past Surgical History:  Procedure Laterality Date   ADENOIDECTOMY  2017   ANOMALOUS PULMONARY VENOUS RETURN REPAIR, TOTAL  2021   CAROTID STENT  2007   CAROTID STENT  2001   CATARACT EXTRACTION Bilateral 2017   COLONOSCOPY WITH PROPOFOL N/A 10/31/2016   Procedure: COLONOSCOPY WITH PROPOFOL;  Surgeon: Scot Jun, MD;  Location: [facility] ENDOSCOPY;  Service: Endoscopy;  Laterality: N/A;   CORONARY ANGIOPLASTY     CORONARY ARTERY BYPASS GRAFT  2006   triple    ESOPHAGOGASTRODUODENOSCOPY  2016   LUMBAR FUSION  2001   SEPTOPLASTY     TONSILLECTOMY       PREVIOUS MEDICATIONS:   CURRENT MEDICATIONS:  Outpatient Encounter Medications as of 12/04/2022  Medication Sig   albuterol (VENTOLIN HFA) 108 (90 Base) MCG/ACT inhaler Inhale 1-2 puffs into the lungs every 6 (six) hours as needed.   amLODipine (NORVASC) 10 MG tablet TAKE 1 TABLET BY MOUTH EVERY EVENING.   aspirin (ASPIRIN 81) 81 MG chewable tablet Chew 1 tablet by mouth daily.   aspirin EC 81 MG tablet Take 81 mg by mouth daily.   Azelastine HCl (ASTEPRO) 0.15 % SOLN Place 1 spray into the nose at bedtime.   budesonide (PULMICORT) 1 MG/2ML nebulizer solution Take 1 mg by nebulization as needed.   carvedilol (COREG) 3.125 MG tablet TAKE 1 TABLET BY MOUTH 2 TIMES DAILY.   clindamycin (CLEOCIN) 150 MG capsule Take 150 mg by mouth once. 2 tablets 2 hours prior to Dental Procedures   clopidogrel (PLAVIX) 75 MG tablet TAKE 1 TABLET BY MOUTH DAILY   cyanocobalamin (VITAMIN B12) 1000 MCG tablet Take 1,000 mcg by mouth daily.   diclofenac Sodium (VOLTAREN) 1 % GEL Apply topically 4 (four) times daily.   Evolocumab (REPATHA SURECLICK) 140 MG/ML SOAJ Inject 140 mg into the skin every 14 (fourteen) days.   ezetimibe (ZETIA) 10 MG tablet TAKE 1 TABLET BY MOUTH DAILY   Fluticasone-Umeclidin-Vilant (TRELEGY ELLIPTA) 100-62.5-25 MCG/ACT AEPB Inhale 1 puff into the lungs daily.   memantine (NAMENDA) 10 MG tablet Take 1 tablet (10 mg total) by mouth daily.   ramipril (ALTACE) 10 MG capsule TAKE 1 CAPSULE BY MOUTH EVERY DAY   [DISCONTINUED] memantine (NAMENDA) 5 MG tablet Take 5 mg by mouth 2 (two) times daily.   No facility-administered encounter medications on file as of 12/04/2022.     Objective:     PHYSICAL EXAMINATION:    VITALS:   Vitals:   12/04/22 0902  BP: (!) 168/60  Pulse: 64  Resp: 18  SpO2: 97%  Weight: 158 lb (71.7 kg)  Height: 5\' 7"  (1.702 m)    GEN:  The patient appears stated  age and is in NAD. HEENT:  Normocephalic, atraumatic.   Neurological examination:  General: NAD, well-groomed, appears stated age. Orientation: The patient is alert. Oriented to person, place  and date Cranial nerves: There is good facial symmetry.The speech is fluent and clear. No aphasia or dysarthria. Fund of knowledge is appropriate. Recent memory impaired and remote memory is normal.  Attention and concentration are normal.  Able to name objects and repeat phrases.  Hearing is intact to conversational tone .   Delayed recall 3/3 Sensation: Sensation is intact to light touch throughout Motor: Strength is at least antigravity x4. DTR's 2/4 in UE/LE      06/01/2022   10:00 AM  Montreal Cognitive Assessment   Visuospatial/ Executive (0/5) 4  Naming (0/3) 3  Attention: Read list of digits (0/2) 2  Attention: Read list of letters (0/1) 1  Attention: Serial 7 subtraction starting at 100 (0/3) 3  Language: Repeat phrase (0/2) 1  Language : Fluency (0/1) 1  Abstraction (0/2) 1  Delayed Recall (0/5) 4  Orientation (0/6) 6  Total 26  Adjusted Score (based on education) 26       12/04/2022   12:00 PM  MMSE - Mini Mental State Exam  Orientation to time 5  Orientation to Place 5  Registration 3  Attention/ Calculation 5  Recall 3  Language- name 2 objects 2  Language- repeat 1  Language- follow 3 step command 3  Language- read & follow direction 1  Write a sentence 1  Copy design 1  Total score 30       Movement examination: Tone: There is normal tone in the UE/LE Abnormal movements:  no tremor.  No myoclonus.  No asterixis.   Coordination:  There is no decremation with RAM's. Normal finger to nose  Gait and Station: The patient has no difficulty arising out of a deep-seated chair without the use of the hands. The patient's stride length is good.  Gait is cautious and narrow.   Thank you for allowing [place] the opportunity to participate in the care of this nice patient. Please  do not hesitate to contact us for any questions or concerns.   Total time spent on today's visit was 21 minutes dedicated to this patient today, preparing to see patient, examining the patient, ordering tests and/or medications and counseling the patient, documenting clinical information in the EHR or other health record, independently interpreting results and communicating results to the patient/family, discussing treatment and goals, answering patient's questions and coordinating care.  Cc:  Sharon Seller, NP  Marlowe Kays 12/04/2022 12:10 PM

## 2022-12-11 ENCOUNTER — Encounter: Payer: Self-pay | Admitting: Cardiology

## 2022-12-11 MED ORDER — LISINOPRIL 40 MG PO TABS: 40.0000 mg | ORAL_TABLET | Freq: Every day | ORAL | 2 refills | Status: DC

## 2022-12-11 NOTE — Telephone Encounter (Signed)
Supple, Megan E, RPH-CPP     If his pressure has normalized on his current supply, ok to continue if the pharmacy can order future fills from the original supplier. If he wishes to change to a different med, lisinopril 40mg  daily would be a comparable effective dose to ramipril 10mg  daily.

## 2023-01-04 ENCOUNTER — Encounter: Payer: Self-pay | Admitting: Cardiology

## 2023-01-04 ENCOUNTER — Ambulatory Visit: Payer: Medicare Other | Attending: Cardiology | Admitting: Cardiology

## 2023-01-04 VITALS — BP 114/60 | HR 59 | Ht 67.0 in | Wt 158.0 lb

## 2023-01-04 DIAGNOSIS — I10 Essential (primary) hypertension: Secondary | ICD-10-CM | POA: Diagnosis not present

## 2023-01-04 DIAGNOSIS — I2510 Atherosclerotic heart disease of native coronary artery without angina pectoris: Secondary | ICD-10-CM

## 2023-01-04 DIAGNOSIS — I2583 Coronary atherosclerosis due to lipid rich plaque: Secondary | ICD-10-CM

## 2023-01-04 DIAGNOSIS — Z952 Presence of prosthetic heart valve: Secondary | ICD-10-CM

## 2023-01-04 NOTE — Progress Notes (Signed)
Cardiology Office Note:  .   Date:  01/04/2023  ID:  Carolyne Littles, DOB Jan 16, 1946, MRN [id number] PCP: Sharon Seller, NP  [facility] Providers Cardiologist:  Donato Schultz, MD     History of Present Illness: .   NATIVIDAD HALLS is a 77 y.o. male here for follow-up CAD, CABG 12/28/2014, stroke 2020, hypertension, hyperlipidemia, thrombocytopenia, type 2 diabetes, nonspecific T wave changes post TAVR former patient of Dr. America Brown. Right carotid artery endarterectomy 2020 followed by Dr. Marena Chancy following stroke widely patent on CT Left carotid flow dissection flap carotid approach to TAVR Medtronic CoreValve June 2021 Repatha hyperlipidemia  CABG-LIMA to LAD vein graft to PDA vein graft to OM 2  PCI post vein graft to OM 2   Ramipril switched to lisinopril 40 mg.  Been battling memory impairment.  Intolerance to statins.  Former smoker  Discussed the use of AI scribe software for clinical note transcription with the patient, who gave verbal consent to proceed.  History of Present Illness   Mr. Tyler Deis, a patient with a history of aortic valve replacement via TAVR and right-sided carotid artery disease post-stroke in 2020, reports feeling well with no chest pain, shortness of breath, or fainting episodes. He is currently on Repatha, which he finds effective. He also takes Lisinopril, which he is satisfied with.  The patient has been under the care of a vascular surgeon, Dr. Marena Chancy, for regular ultrasound check-ups of his carotid arteries and TAVR valve. However, he reports a gap in his care when his previous doctor left, and he did not receive a call for his annual check-up this year. He expresses no current symptoms related to his cardiovascular conditions.  The patient's overall health appears stable with good control of his blood pressure and cholesterol levels. He expresses gratitude for his current state of health and the care he is receiving.      ROS: No CP, no  SOB  Studies Reviewed: Marland Kitchen   EKG Interpretation Date/Time:  Friday January 04 2023 08:03:04 EDT Ventricular Rate:  59 PR Interval:  162 QRS Duration:  92 QT Interval:  424 QTC Calculation: 419 R Axis:   118  Text Interpretation: Sinus bradycardia Incomplete right bundle branch block Left posterior fascicular block Anterior infarct , age undetermined When compared with ECG of 15-Nov-2017 11:23, No significant change since last tracing Confirmed by Donato Schultz ([id number]) on 01/04/2023 8:12:25 AM    LABS LDL cholesterol: 30 (06/2022) Creatinine: 0.75 Hemoglobin A1c: 6.1  DIAGNOSTIC EKG: No changes from prior  Risk Assessment/Calculations:           Physical Exam:   VS:  BP 114/60   Pulse (!) 59   Ht 5\' 7"  (1.702 m)   Wt 158 lb (71.7 kg)   SpO2 96%   BMI 24.75 kg/m    Wt Readings from Last 3 Encounters:  01/04/23 158 lb (71.7 kg)  12/04/22 158 lb (71.7 kg)  09/10/22 163 lb (73.9 kg)    GEN: Well nourished, well developed in no acute distress NECK: No JVD; No carotid bruits CARDIAC: RRR, no murmurs, rubs, gallops RESPIRATORY:  Clear to auscultation without rales, wheezing or rhonchi  ABDOMEN: Soft, non-tender, non-distended EXTREMITIES:  No edema; No deformity   ASSESSMENT AND PLAN: .    Assessment and Plan    Aortic Valve Replacement (TAVR) No symptoms of valve dysfunction. No recent echocardiogram to assess valve function. -Order echocardiogram to assess TAVR valve function.  Carotid Artery Disease History of  right carotid artery stenosis with intervention in 2020. No recent symptoms suggestive of cerebrovascular insufficiency. -Continue current management with vascular surgeon.  Hyperlipidemia Well controlled on Repatha and Zetia. LDL 30 in March 2024. -Continue Repatha and Zetia.  Hypertension Well controlled on Lisinopril 40mg  daily and Amlodipine 10mg  daily. -Continue Lisinopril 40mg  daily and Amlodipine 10mg  daily.   Follow-up in 1 year unless changes  occur.              Signed, Donato Schultz, MD

## 2023-01-04 NOTE — Patient Instructions (Signed)
Medication Instructions:  Your physician recommends that you continue on your current medications as directed. Please refer to the Current Medication list given to you today.  *If you need a refill on your cardiac medications before your next appointment, please call your pharmacy*  Lab Work: If you have labs (blood work) drawn today and your tests are completely normal, you will receive your results only by: [place] (if you have MyChart) OR A paper copy in the mail If you have any lab test that is abnormal or we need to change your treatment, we will call you to review the results.  Testing/Procedures: Your physician has requested that you have an echocardiogram. Echocardiography is a painless test that uses sound waves to create images of your heart. It provides your doctor with information about the size and shape of your heart and how well your heart's chambers and valves are working. This procedure takes approximately one hour. There are no restrictions for this procedure. Please do NOT wear cologne, perfume, aftershave, or lotions (deodorant is allowed). Please arrive 15 minutes prior to your appointment time.  Follow-Up: At [facility], you and your health needs are our priority.  As part of our continuing mission to provide you with exceptional heart care, we have created designated Provider Care Teams.  These Care Teams include your primary Cardiologist (physician) and Advanced Practice Providers (APPs -  Physician Assistants and Nurse Practitioners) who all work together to provide you with the care you need, when you need it.  We recommend signing up for the patient portal called "MyChart".  Sign up information is provided on this After Visit Summary.  MyChart is used to connect with patients for Virtual Visits (Telemedicine).  Patients are able to view lab/test results, encounter notes, upcoming appointments, etc.  Non-urgent messages can be sent to your provider as  well.   To learn more about what you can do with MyChart, go to [email or web address].    Your next appointment:   1 year(s)  Provider:   Candee Furbish, MD

## 2023-01-18 ENCOUNTER — Ambulatory Visit (INDEPENDENT_AMBULATORY_CARE_PROVIDER_SITE_OTHER): Payer: Medicare Other | Admitting: Nurse Practitioner

## 2023-01-18 ENCOUNTER — Encounter: Payer: Self-pay | Admitting: Nurse Practitioner

## 2023-01-18 VITALS — BP 118/58 | HR 80 | Temp 97.6°F | Ht 67.0 in | Wt 159.6 lb

## 2023-01-18 DIAGNOSIS — R001 Bradycardia, unspecified: Secondary | ICD-10-CM

## 2023-01-18 DIAGNOSIS — Z23 Encounter for immunization: Secondary | ICD-10-CM | POA: Diagnosis not present

## 2023-01-18 DIAGNOSIS — R739 Hyperglycemia, unspecified: Secondary | ICD-10-CM | POA: Diagnosis not present

## 2023-01-18 DIAGNOSIS — F01A0 Vascular dementia, mild, without behavioral disturbance, psychotic disturbance, mood disturbance, and anxiety: Secondary | ICD-10-CM

## 2023-01-18 DIAGNOSIS — I10 Essential (primary) hypertension: Secondary | ICD-10-CM

## 2023-01-18 DIAGNOSIS — E785 Hyperlipidemia, unspecified: Secondary | ICD-10-CM | POA: Diagnosis not present

## 2023-01-18 DIAGNOSIS — E538 Deficiency of other specified B group vitamins: Secondary | ICD-10-CM | POA: Diagnosis not present

## 2023-01-18 NOTE — Progress Notes (Signed)
Careteam: Patient Care Team: Sharon Seller, NP as PCP - General (Geriatric Medicine) Jake Bathe, MD as PCP - Cardiology (Cardiology) Lewis Moccasin, MD as Attending Physician (Emergency Medicine) Drema Dallas, DO as Consulting Physician (Neurology) Gilda Crease, Latina Craver, MD (Vascular Surgery) Elwyn Reach (Neurology) [facility], [place]  PLACE OF SERVICE:  [facility]  Advanced Directive information    Allergies  Allergen Reactions   Penicillins Other (See Comments)    Unknown childhood allergic reaction - severe Has patient had a PCN reaction causing immediate rash, facial/tongue/throat swelling, SOB or lightheadedness with hypotension: Unknown Has patient had a PCN reaction causing severe rash involving mucus membranes or skin necrosis: Unknown Has patient had a PCN reaction that required hospitalization: Yes Has patient had a PCN reaction occurring within the last 10 years: No If all of the above answers are "NO", then may proceed with Cephalosporin use.   Xanax [Alprazolam] Shortness Of Breath   Aricept [Donepezil Hcl]    Atorvastatin Other (See Comments)    Difficulty breathing    Bupropion Other (See Comments)    Unknown reaction   Citalopram Other (See Comments)    Unknown reaction   Cymbalta [Duloxetine Hcl] Other (See Comments)    Unknown reaction   Duloxetine Other (See Comments)   Fluoxetine Other (See Comments)    Unknown reaction   Fluvoxamine Other (See Comments)    Unknown reaction   Lexapro [Escitalopram Oxalate] Other (See Comments)    Unknown reaction   Niacin And Related Other (See Comments)    Unknown reaction   Oxcarbazepine Other (See Comments)    Unknown reaction   Pravastatin Other (See Comments)    Unknown reaction   Quetiapine Other (See Comments)   Seroquel [Quetiapine Fumarate] Other (See Comments)    Unknown reaction   Statins Other (See Comments)    Unknown adverse reaction   Tamsulosin Other (See  Comments)    Unknown reaction   Zoloft [Sertraline Hcl] Other (See Comments)    Unknown reaction    Chief Complaint  Patient presents with   Medical Management of Chronic Issues    Patient presents today for a 4 month follow-up   Quality Metric Gaps    COVID and Flu     HPI: Patient is a 77 y.o. male presents for a 76-month follow up on chronic issues.  Saw cardiology, states everything is well.  Memory is stable, he's pleased with it.  Has new pulmonologist, reports he was told he wasn't properly diagnosed and may not have COPD but that will be revisited. Denies shortness of breath. States he's very active around the house, also walks 3x/week for about 1 hour. Last 4 months he's felt great. He's involved with his church.  Needs new eye doctor, had last eye exam 2 years ago.  Review of Systems:  Review of Systems  Constitutional:  Negative for chills, fever, malaise/fatigue and weight loss.  Respiratory:  Negative for cough and shortness of breath.   Cardiovascular:  Negative for chest pain, palpitations and leg swelling.  Gastrointestinal:  Negative for constipation, diarrhea, nausea and vomiting.  Genitourinary:  Negative for dysuria, frequency and urgency.  Musculoskeletal:  Negative for myalgias.  Neurological:  Negative for dizziness, weakness and headaches.  Psychiatric/Behavioral:  Positive for memory loss (stable). Negative for depression. The patient is not nervous/anxious and does not have insomnia.     Past Medical History:  Diagnosis Date   Arteriosclerosis of coronary  artery 12/22/2014   Asthma    Benign prostatic hyperplasia without lower urinary tract symptoms 09/13/2012   Cervical spondylosis 02/27/2013   Chronic idiopathic constipation 11/02/2015   Chronic obstructive pulmonary disease 12/22/2014   Coronary artery disease    DDD (degenerative disc disease), cervical 02/27/2013   DDD (degenerative disc disease), lumbosacral 02/27/2013   Dysphagia 10/18/2016    ED (erectile dysfunction)    Generalized anxiety disorder 01/21/2013   GERD (gastroesophageal reflux disease)    Heart murmur    EJECTION MURMUR   HLD (hyperlipidemia)    Hx of colon polyps    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   Thrombocytopenia (HCC)    Vitamin D deficiency    Past Surgical History:  Procedure Laterality Date   ADENOIDECTOMY  2017   ANOMALOUS PULMONARY VENOUS RETURN REPAIR, TOTAL  2021   CAROTID STENT  2007   CAROTID STENT  2001   CATARACT EXTRACTION Bilateral 2017   COLONOSCOPY WITH PROPOFOL N/A 10/31/2016   Procedure: COLONOSCOPY WITH PROPOFOL;  Surgeon: Scot Jun, MD;  Location: [facility] ENDOSCOPY;  Service: Endoscopy;  Laterality: N/A;   CORONARY ANGIOPLASTY     CORONARY ARTERY BYPASS GRAFT  2006   triple   ESOPHAGOGASTRODUODENOSCOPY  2016   LUMBAR FUSION  2001   SEPTOPLASTY     TONSILLECTOMY     Social History:   reports that he quit smoking about 34 years ago. His smoking use included cigarettes. He started smoking about 64 years ago. He has a 30 pack-year smoking history. He has never used smokeless tobacco. He reports that he does not drink alcohol and does not use drugs.  Family History  Problem Relation Age of Onset   Headache Mother    Cancer Father        Lung cancer   Parkinson's disease Paternal Grandmother        Unknown if paternal or maternal side of family   Alzheimer's disease Paternal Grandfather        Unknown if paternal or maternal side of family    Medications: Patient's Medications  New Prescriptions   No  medications on file  Previous Medications   ALBUTEROL (VENTOLIN HFA) 108 (90 BASE) MCG/ACT INHALER    Inhale 1-2 puffs into the lungs every 6 (six) hours as needed.   AMLODIPINE (NORVASC) 10 MG TABLET    TAKE 1 TABLET BY MOUTH EVERY EVENING.   ASPIRIN EC 81 MG TABLET    Take 81 mg by mouth daily.   AZELASTINE HCL (ASTEPRO) 0.15 % SOLN    Place 1 spray into the nose at bedtime.   BUDESONIDE (PULMICORT) 1 MG/2ML NEBULIZER SOLUTION    Take 1 mg by nebulization as needed.   CARVEDILOL (COREG) 3.125 MG TABLET    TAKE 1 TABLET BY MOUTH 2 TIMES DAILY.   CLINDAMYCIN (CLEOCIN) 150 MG CAPSULE    Take 150 mg by mouth once. 2 tablets 2 hours prior to Dental Procedures   CLOPIDOGREL (PLAVIX) 75 MG TABLET    TAKE 1 TABLET BY MOUTH DAILY   CYANOCOBALAMIN (  VITAMIN B12) 1000 MCG TABLET    Take 1,000 mcg by mouth daily.   DICLOFENAC SODIUM (VOLTAREN) 1 % GEL    Apply topically 4 (four) times daily.   EVOLOCUMAB (REPATHA SURECLICK) 140 MG/ML SOAJ    Inject 140 mg into the skin every 14 (fourteen) days.   EZETIMIBE (ZETIA) 10 MG TABLET    TAKE 1 TABLET BY MOUTH DAILY   FLUTICASONE-UMECLIDIN-VILANT (TRELEGY ELLIPTA) 100-62.5-25 MCG/ACT AEPB    Inhale 1 puff into the lungs daily.   LISINOPRIL (ZESTRIL) 40 MG TABLET    Take 1 tablet (40 mg total) by mouth daily.   MEMANTINE (NAMENDA) 10 MG TABLET    Take 1 tablet (10 mg total) by mouth daily.  Modified Medications   No medications on file  Discontinued Medications   ASPIRIN (ASPIRIN 81) 81 MG CHEWABLE TABLET    Chew 1 tablet by mouth daily.    Physical Exam:  Vitals:   01/18/23 0829  BP: (!) 118/58  Pulse: 80  Temp: 97.6 F (36.4 C)  SpO2: 99%  Weight: 72.4 kg  Height: 5\' 7"  (1.702 m)   Body mass index is 25 kg/m. Wt Readings from Last 3 Encounters:  01/18/23 72.4 kg  01/04/23 71.7 kg  12/04/22 71.7 kg    Physical Exam Constitutional:      Appearance: Normal appearance.  HENT:     Right Ear: Tympanic membrane normal.     Left Ear: Tympanic  membrane normal.     Nose: Nose normal.     Mouth/Throat:     Mouth: Mucous membranes are dry.  Cardiovascular:     Rate and Rhythm: Normal rate and regular rhythm.     Pulses: Normal pulses.     Heart sounds: Normal heart sounds.  Pulmonary:     Effort: Pulmonary effort is normal.     Breath sounds: Normal breath sounds.  Abdominal:     General: Bowel sounds are normal.     Palpations: Abdomen is soft.  Musculoskeletal:     Right lower leg: No edema.     Left lower leg: No edema.  Skin:    General: Skin is warm and dry.  Neurological:     General: No focal deficit present.     Mental Status: He is alert. Mental status is at baseline.  Psychiatric:        Mood and Affect: Mood normal.        Behavior: Behavior normal.        Thought Content: Thought content normal.     Labs reviewed: Basic Metabolic Panel: Recent Labs    06/19/22 0809  NA 140  K 4.8  CL 101  CO2 26  GLUCOSE 102*  BUN 13  CREATININE 0.75  CALCIUM 9.0   Liver Function Tests: Recent Labs    06/19/22 0809  AST 16  ALT 14  BILITOT 0.7  PROT 6.8   No results for input(s): "LIPASE", "AMYLASE" in the last 8760 hours. No results for input(s): "AMMONIA" in the last 8760 hours. CBC: Recent Labs    06/19/22 0809  WBC 7.2  NEUTROABS 4,385  HGB 15.6  HCT 46.0  MCV 88.8  PLT 218   Lipid Panel: Recent Labs    06/19/22 0809  CHOL 101  HDL 49  LDLCALC 35  TRIG 91  CHOLHDL 2.1   TSH: No results for input(s): "TSH" in the last 8760 hours. A1C: Lab Results  Component Value Date   HGBA1C 6.1 (H) 06/19/2022  Assessment/Plan 1. Primary hypertension -Controlled, at goal, <140/90 -Continue current medication regimen - COMPLETE METABOLIC PANEL WITH GFR - CBC with Differential/Platelet -Encouraged dietary modifications/DASH diet and physical activity as tolerated  2. Hyperlipidemia, unspecified hyperlipidemia type -Encouraged dietary modifications and physical activity as  tolerated -Continue current medication regimen  3. Bradycardia -improved, rate normal today with decrease in carvediolol.   4. Mild vascular dementia without behavioral disturbance, psychotic disturbance, mood disturbance, or anxiety (HCC) -Stable -Continue current medication regimen and supportive care Continue on namenda.   5. Vitamin B12 deficiency -Continue supplementation  6. Hyperglycemia - Hemoglobin A1c -Encouraged dietary modifications and physical activity as tolerated  7. Need for influenza vaccination - Flu Vaccine Trivalent High Dose (Fluad)   Return in about 6 months (around 07/19/2023) for routine follow up .  Rollen Sox, FNP-MSN Student -I personally was present during the history, physical exam and medical decision-making activities of this service and have verified that the service and findings are accurately documented in the student's note Abbey Chatters, NP

## 2023-01-18 NOTE — Patient Instructions (Signed)
[facility] Address: [address] Dian Situ [place], [place] [address] [phone]   [facility], [place] Address: [address], [place], [place] [address] Phone: [phone]

## 2023-01-19 LAB — COMPLETE METABOLIC PANEL WITH GFR
AG Ratio: 1.8 (calc) (ref 1.0–2.5)
ALT: 12 U/L (ref 9–46)
AST: 14 U/L (ref 10–35)
Albumin: 4.5 g/dL (ref 3.6–5.1)
Alkaline phosphatase (APISO): 73 U/L (ref 35–144)
BUN: 20 mg/dL (ref 7–25)
CO2: 29 mmol/L (ref 20–32)
Calcium: 9.5 mg/dL (ref 8.6–10.3)
Chloride: 102 mmol/L (ref 98–110)
Creat: 0.9 mg/dL (ref 0.70–1.28)
Globulin: 2.5 g/dL (ref 1.9–3.7)
Glucose, Bld: 96 mg/dL (ref 65–99)
Potassium: 5 mmol/L (ref 3.5–5.3)
Sodium: 138 mmol/L (ref 135–146)
Total Bilirubin: 0.6 mg/dL (ref 0.2–1.2)
Total Protein: 7 g/dL (ref 6.1–8.1)
eGFR: 88 mL/min/{1.73_m2} (ref >=60)

## 2023-01-19 LAB — HEMOGLOBIN A1C
Hgb A1c MFr Bld: 6.1 %{Hb} — ABNORMAL HIGH (ref <5.7)
Mean Plasma Glucose: 128 mg/dL
eAG (mmol/L): 7.1 mmol/L

## 2023-01-19 LAB — CBC WITH DIFFERENTIAL/PLATELET
Absolute Monocytes: 679 {cells}/uL (ref 200–950)
Basophils Absolute: 88 {cells}/uL (ref 0–200)
Basophils Relative: 1.2 %
Eosinophils Absolute: 110 {cells}/uL (ref 15–500)
Eosinophils Relative: 1.5 %
HCT: 45.8 % (ref 38.5–50.0)
Hemoglobin: 15.1 g/dL (ref 13.2–17.1)
Lymphs Abs: 1708 {cells}/uL (ref 850–3900)
MCH: 30.1 pg (ref 27.0–33.0)
MCHC: 33 g/dL (ref 32.0–36.0)
MCV: 91.2 fL (ref 80.0–100.0)
MPV: 9.5 fL (ref 7.5–12.5)
Monocytes Relative: 9.3 %
Neutro Abs: 4716 {cells}/uL (ref 1500–7800)
Neutrophils Relative %: 64.6 %
Platelets: 252 10*3/uL (ref 140–400)
RBC: 5.02 10*6/uL (ref 4.20–5.80)
RDW: 12.3 % (ref 11.0–15.0)
Total Lymphocyte: 23.4 %
WBC: 7.3 10*3/uL (ref 3.8–10.8)

## 2023-01-23 ENCOUNTER — Ambulatory Visit (HOSPITAL_COMMUNITY): Payer: Medicare Other | Attending: Cardiology

## 2023-01-23 DIAGNOSIS — I10 Essential (primary) hypertension: Secondary | ICD-10-CM | POA: Insufficient documentation

## 2023-01-23 DIAGNOSIS — Z952 Presence of prosthetic heart valve: Secondary | ICD-10-CM | POA: Insufficient documentation

## 2023-01-23 LAB — ECHOCARDIOGRAM COMPLETE
AR max vel: 3.14 cm2
AV Area VTI: 3.07 cm2
AV Area mean vel: 3.31 cm2
AV Mean grad: 6.2 mm[Hg]
AV Peak grad: 11.6 mm[Hg]
Ao pk vel: 1.7 m/s
Area-P 1/2: 3.39 cm2
S' Lateral: 2.7 cm

## 2023-02-05 ENCOUNTER — Ambulatory Visit: Payer: Self-pay

## 2023-02-05 ENCOUNTER — Institutional Professional Consult (permissible substitution): Payer: Medicare Other | Admitting: Psychology

## 2023-02-12 ENCOUNTER — Encounter: Payer: Medicare Other | Admitting: Psychology

## 2023-02-18 ENCOUNTER — Other Ambulatory Visit: Payer: Self-pay | Admitting: Cardiology

## 2023-02-22 ENCOUNTER — Encounter: Payer: Self-pay | Admitting: Nurse Practitioner

## 2023-02-26 NOTE — Telephone Encounter (Signed)
Please call patient and get more information, if he is having issue do not just want to disregard issue- he can also schedule virtual visit to discuss or make appt for in office appt.  Thank you

## 2023-03-26 ENCOUNTER — Encounter: Payer: Self-pay | Admitting: Nurse Practitioner

## 2023-03-26 ENCOUNTER — Telehealth: Payer: Medicare Other | Admitting: Nurse Practitioner

## 2023-03-26 VITALS — Temp 101.0°F

## 2023-03-26 DIAGNOSIS — U071 COVID-19: Secondary | ICD-10-CM | POA: Diagnosis not present

## 2023-03-26 MED ORDER — NIRMATRELVIR/RITONAVIR (PAXLOVID)TABLET
3.0000 | ORAL_TABLET | Freq: Two times a day (BID) | ORAL | 0 refills | Status: AC
Start: 2023-03-26 — End: 2023-03-31

## 2023-03-26 NOTE — Progress Notes (Signed)
Careteam: Patient Care Team: Sharon Seller, NP as PCP - General (Geriatric Medicine) Jake Bathe, MD as PCP - Cardiology (Cardiology) Lewis Moccasin, MD as Attending Physician (Emergency Medicine) Drema Dallas, DO as Consulting Physician (Neurology) Gilda Crease, Latina Craver, MD (Vascular Surgery) Elwyn Reach (Neurology) [facility], [place]  Advanced Directive information    Allergies  Allergen Reactions   Penicillins Other (See Comments)    Unknown childhood allergic reaction - severe Has patient had a PCN reaction causing immediate rash, facial/tongue/throat swelling, SOB or lightheadedness with hypotension: Unknown Has patient had a PCN reaction causing severe rash involving mucus membranes or skin necrosis: Unknown Has patient had a PCN reaction that required hospitalization: Yes Has patient had a PCN reaction occurring within the last 10 years: No If all of the above answers are "NO", then may proceed with Cephalosporin use.   Xanax [Alprazolam] Shortness Of Breath   Aricept [Donepezil Hcl]    Atorvastatin Other (See Comments)    Difficulty breathing    Bupropion Other (See Comments)    Unknown reaction   Citalopram Other (See Comments)    Unknown reaction   Cymbalta [Duloxetine Hcl] Other (See Comments)    Unknown reaction   Duloxetine Other (See Comments)   Fluoxetine Other (See Comments)    Unknown reaction   Fluvoxamine Other (See Comments)    Unknown reaction   Lexapro [Escitalopram Oxalate] Other (See Comments)    Unknown reaction   Niacin And Related Other (See Comments)    Unknown reaction   Oxcarbazepine Other (See Comments)    Unknown reaction   Pravastatin Other (See Comments)    Unknown reaction   Quetiapine Other (See Comments)   Seroquel [Quetiapine Fumarate] Other (See Comments)    Unknown reaction   Statins Other (See Comments)    Unknown adverse reaction   Tamsulosin Other (See Comments)    Unknown reaction    Zoloft [Sertraline Hcl] Other (See Comments)    Unknown reaction    Chief Complaint  Patient presents with   Acute Visit    Positive Covid Test. Test Positive This morning. Having Headache, Congestion and Fever of 101     HPI: Patient is a 77 y.o. male due to positive COVID.  He was fine last night but he got up this morning and had a fever 101 and felt horrible.  No sore throat  Dizziness and headache.  No chest congestion or cough.  Nasal congestion Feels bad due to fever.    Review of Systems:  Review of Systems  Constitutional:  Positive for chills, fever and malaise/fatigue. Negative for weight loss.  HENT:  Positive for congestion and sore throat. Negative for sinus pain and tinnitus.   Respiratory:  Negative for cough, sputum production and shortness of breath.   Cardiovascular:  Negative for chest pain, palpitations and leg swelling.  Gastrointestinal:  Negative for abdominal pain, constipation, diarrhea and heartburn.  Genitourinary:  Negative for dysuria, frequency and urgency.  Musculoskeletal:  Negative for back pain, falls, joint pain and myalgias.  Skin: Negative.   Neurological:  Positive for dizziness and headaches.  Psychiatric/Behavioral:  Negative for depression and memory loss. The patient does not have insomnia.     Past Medical History:  Diagnosis Date   Arteriosclerosis of coronary artery 12/22/2014   Asthma    Benign prostatic hyperplasia without lower urinary tract symptoms 09/13/2012   Cervical spondylosis 02/27/2013   Chronic idiopathic constipation 11/02/2015  Chronic obstructive pulmonary disease 12/22/2014   Coronary artery disease    DDD (degenerative disc disease), cervical 02/27/2013   DDD (degenerative disc disease), lumbosacral 02/27/2013   Dysphagia 10/18/2016   ED (erectile dysfunction)    Generalized anxiety disorder 01/21/2013   GERD (gastroesophageal reflux disease)    Heart murmur    EJECTION MURMUR   HLD (hyperlipidemia)     Hx of colon polyps    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   Thrombocytopenia (HCC)    Vitamin D deficiency    Past Surgical History:  Procedure Laterality Date   ADENOIDECTOMY  2017   ANOMALOUS PULMONARY VENOUS RETURN REPAIR, TOTAL  2021   CAROTID STENT  2007   CAROTID STENT  2001   CATARACT EXTRACTION Bilateral 2017   COLONOSCOPY WITH PROPOFOL N/A 10/31/2016   Procedure: COLONOSCOPY WITH PROPOFOL;  Surgeon: Scot Jun, MD;  Location: [facility] ENDOSCOPY;  Service: Endoscopy;  Laterality: N/A;   CORONARY ANGIOPLASTY     CORONARY ARTERY BYPASS GRAFT  2006   triple   ESOPHAGOGASTRODUODENOSCOPY  2016   LUMBAR FUSION  2001   SEPTOPLASTY     TONSILLECTOMY     Social History:   reports that he quit smoking about 34 years ago. His smoking use included cigarettes. He started smoking about 64 years ago. He has a 30 pack-year smoking history. He has never used smokeless tobacco. He reports that he does not drink alcohol and does not use drugs.  Family History  Problem Relation Age of Onset   Headache Mother    Cancer Father        Lung cancer   Parkinson's disease Paternal Grandmother        Unknown if paternal or maternal side of family   Alzheimer's disease Paternal Grandfather        Unknown if paternal or maternal side of family    Medications: Patient's Medications  New Prescriptions   No medications on file  Previous Medications   ALBUTEROL (VENTOLIN HFA) 108 (90 BASE) MCG/ACT INHALER    Inhale 1-2 puffs into the lungs every 6 (six) hours as needed.   AMLODIPINE  (NORVASC) 10 MG TABLET    TAKE 1 TABLET BY MOUTH EVERY EVENING.   ASPIRIN EC 81 MG TABLET    Take 81 mg by mouth daily.   AZELASTINE HCL (ASTEPRO) 0.15 % SOLN    Place 1 spray into the nose at bedtime.   BUDESONIDE (PULMICORT) 1 MG/2ML NEBULIZER SOLUTION    Take 1 mg by nebulization as needed.   CARVEDILOL (COREG) 3.125 MG TABLET    Take 1 tablet (3.125 mg total) by mouth 2 (two) times daily.   CLINDAMYCIN (CLEOCIN) 150 MG CAPSULE    Take 150 mg by mouth once. 2 tablets 2 hours prior to Dental Procedures   CLOPIDOGREL (PLAVIX) 75 MG TABLET    TAKE 1 TABLET BY MOUTH DAILY   CYANOCOBALAMIN (VITAMIN B12) 1000 MCG TABLET    Take 1,000 mcg by mouth daily.   DICLOFENAC SODIUM (VOLTAREN) 1 % GEL    Apply  topically 4 (four) times daily.   EVOLOCUMAB (REPATHA SURECLICK) 140 MG/ML SOAJ    Inject 140 mg into the skin every 14 (fourteen) days.   EZETIMIBE (ZETIA) 10 MG TABLET    TAKE 1 TABLET BY MOUTH DAILY   FLUTICASONE-UMECLIDIN-VILANT (TRELEGY ELLIPTA) 100-62.5-25 MCG/ACT AEPB    Inhale 1 puff into the lungs daily.   LISINOPRIL (ZESTRIL) 40 MG TABLET    Take 1 tablet (40 mg total) by mouth daily.   MEMANTINE (NAMENDA) 10 MG TABLET    Take 1 tablet (10 mg total) by mouth daily.  Modified Medications   No medications on file  Discontinued Medications   No medications on file    Physical Exam:  Vitals:   03/26/23 0837  Temp: (!) 101 F (38.3 C)   There is no height or weight on file to calculate BMI. Wt Readings from Last 3 Encounters:  01/18/23 159 lb 9.6 oz (72.4 kg)  01/04/23 158 lb (71.7 kg)  12/04/22 158 lb (71.7 kg)    Physical Exam Constitutional:      Appearance: Normal appearance.     Comments: Alert but fatigue   Neurological:     Mental Status: He is alert. Mental status is at baseline.  Psychiatric:        Mood and Affect: Mood normal.     Labs reviewed: Basic Metabolic Panel: Recent Labs    06/19/22 0809 01/18/23 0923  NA 140 138  K 4.8 5.0  CL 101 102  CO2 26  29  GLUCOSE 102* 96  BUN 13 20  CREATININE 0.75 0.90  CALCIUM 9.0 9.5   Liver Function Tests: Recent Labs    06/19/22 0809 01/18/23 0923  AST 16 14  ALT 14 12  BILITOT 0.7 0.6  PROT 6.8 7.0   No results for input(s): "LIPASE", "AMYLASE" in the last 8760 hours. No results for input(s): "AMMONIA" in the last 8760 hours. CBC: Recent Labs    06/19/22 0809 01/18/23 0923  WBC 7.2 7.3  NEUTROABS 4,385 4,716  HGB 15.6 15.1  HCT 46.0 45.8  MCV 88.8 91.2  PLT 218 252   Lipid Panel: Recent Labs    06/19/22 0809  CHOL 101  HDL 49  LDLCALC 35  TRIG 91  CHOLHDL 2.1   TSH: No results for input(s): "TSH" in the last 8760 hours. A1C: Lab Results  Component Value Date   HGBA1C 6.1 (H) 01/18/2023     Assessment/Plan 1. COVID-19 (Primary) Symptom management with paxlovid ordered  Plain nasal saline spray throughout the day as needed May use tylenol 325 mg 2 tablets every 6 hours as needed aches and pains or sore throat humidifier in the home to help with the dry air Mucinex DM by mouth twice daily as needed for cough and chest congestion with full glass of water  Keep well hydrated Proper nutrition  Avoid forcefully blowing nose Vit c 1000 mg twice daily Vit d 2000 units daily  - nirmatrelvir/ritonavir (PAXLOVID) 20 x 150 MG & 10 x 100MG  TABS; Take 3 tablets by mouth 2 (two) times daily for 5 days. (Take nirmatrelvir 150 mg two tablets twice daily for 5 days and ritonavir 100 mg one tablet twice daily for 5 days) Patient GFR is 88  Dispense: 30 tablet; Refill: 0   Strict follow up precautions given Cortlin Marano K. Biagio Borg  [facility] [phone]    Virtual Visit via video  I connected with patient on 03/26/23 at  8:40  AM EST by mychart and verified that I am speaking with the correct person using two identifiers.  Location: Patient: home Provider: [facility]    I discussed the limitations, risks, security and privacy  concerns of performing an evaluation and management service by telephone and the availability of in person appointments. I also discussed with the patient that there may be a patient responsible charge related to this service. The patient expressed understanding and agreed to proceed.   I discussed the assessment and treatment plan with the patient. The patient was provided an opportunity to ask questions and all were answered. The patient agreed with the plan and demonstrated an understanding of the instructions.   The patient was advised to call back or seek an in-person evaluation if the symptoms worsen or if the condition fails to improve as anticipated.  I provided 15 minutes of non-face-to-face time during this encounter.  Janene Harvey. Biagio Borg Avs printed and mailed

## 2023-03-26 NOTE — Progress Notes (Signed)
This service is provided via telemedicine  No vital signs collected/recorded due to the encounter was a telemedicine visit.   Location of patient (ex: home, work):  Home  Patient consents to a telephone visit:  Yes  Location of the provider (ex: office, home):  Office [place].   Name of any referring provider:  na  Names of all persons participating in the telemedicine service and their role in the encounter:  Albert Hayes, patient, Mrs. Bland, Wife, [name], CMA, Abbey Chatters, NP  Time spent on call:  7:12

## 2023-03-26 NOTE — Patient Instructions (Addendum)
  Plain nasal saline spray throughout the day as needed  May use tylenol 325 mg 2 tablets every 6 hours as needed aches and pains or sore throat OR tylenol 500 mg 2 tablets every 8 hours   humidifier in the home to help with the dry air  Mucinex DM by mouth twice daily as needed for cough and chest congestion with full glass of water   Keep well hydrated Proper nutrition  Avoid forcefully blowing nose Vit c 1000 mg twice daily Vit d 2000 units daily

## 2023-03-29 DIAGNOSIS — K2970 Gastritis, unspecified, without bleeding: Secondary | ICD-10-CM | POA: Diagnosis not present

## 2023-03-29 DIAGNOSIS — J449 Chronic obstructive pulmonary disease, unspecified: Secondary | ICD-10-CM | POA: Diagnosis not present

## 2023-03-29 DIAGNOSIS — Z66 Do not resuscitate: Secondary | ICD-10-CM | POA: Diagnosis not present

## 2023-03-29 DIAGNOSIS — E119 Type 2 diabetes mellitus without complications: Secondary | ICD-10-CM | POA: Diagnosis not present

## 2023-03-29 DIAGNOSIS — E785 Hyperlipidemia, unspecified: Secondary | ICD-10-CM | POA: Diagnosis not present

## 2023-03-29 DIAGNOSIS — U071 COVID-19: Secondary | ICD-10-CM | POA: Diagnosis not present

## 2023-03-29 DIAGNOSIS — Z87891 Personal history of nicotine dependence: Secondary | ICD-10-CM | POA: Diagnosis not present

## 2023-03-29 DIAGNOSIS — I10 Essential (primary) hypertension: Secondary | ICD-10-CM | POA: Diagnosis not present

## 2023-03-29 DIAGNOSIS — K219 Gastro-esophageal reflux disease without esophagitis: Secondary | ICD-10-CM | POA: Diagnosis not present

## 2023-03-29 DIAGNOSIS — R112 Nausea with vomiting, unspecified: Secondary | ICD-10-CM | POA: Diagnosis not present

## 2023-03-29 DIAGNOSIS — R509 Fever, unspecified: Secondary | ICD-10-CM | POA: Diagnosis not present

## 2023-04-29 ENCOUNTER — Other Ambulatory Visit: Payer: Self-pay | Admitting: Cardiology

## 2023-05-20 ENCOUNTER — Other Ambulatory Visit: Payer: Self-pay | Admitting: Nurse Practitioner

## 2023-05-20 NOTE — Telephone Encounter (Signed)
 High risk warning populated when attempting to refill medication  Please review and approval if appropriate   Thanks,  [facility] W/CMA

## 2023-06-06 ENCOUNTER — Ambulatory Visit: Payer: Medicare Other | Admitting: Physician Assistant

## 2023-06-06 ENCOUNTER — Encounter: Payer: Self-pay | Admitting: Cardiology

## 2023-06-08 ENCOUNTER — Encounter: Payer: Self-pay | Admitting: Cardiology

## 2023-06-14 MED ORDER — AMLODIPINE BESYLATE 10 MG PO TABS: 10.0000 mg | ORAL_TABLET | Freq: Every evening | ORAL | 1 refills | Status: DC

## 2023-07-19 ENCOUNTER — Ambulatory Visit (INDEPENDENT_AMBULATORY_CARE_PROVIDER_SITE_OTHER): Payer: Medicare Other | Admitting: Nurse Practitioner

## 2023-07-19 ENCOUNTER — Encounter: Payer: Self-pay | Admitting: Nurse Practitioner

## 2023-07-19 VITALS — BP 126/80 | HR 56 | Temp 97.1°F | Ht 67.0 in | Wt 156.0 lb

## 2023-07-19 DIAGNOSIS — F01A0 Vascular dementia, mild, without behavioral disturbance, psychotic disturbance, mood disturbance, and anxiety: Secondary | ICD-10-CM

## 2023-07-19 DIAGNOSIS — J449 Chronic obstructive pulmonary disease, unspecified: Secondary | ICD-10-CM | POA: Diagnosis not present

## 2023-07-19 DIAGNOSIS — F339 Major depressive disorder, recurrent, unspecified: Secondary | ICD-10-CM

## 2023-07-19 DIAGNOSIS — I10 Essential (primary) hypertension: Secondary | ICD-10-CM

## 2023-07-19 DIAGNOSIS — R739 Hyperglycemia, unspecified: Secondary | ICD-10-CM

## 2023-07-19 DIAGNOSIS — I25119 Atherosclerotic heart disease of native coronary artery with unspecified angina pectoris: Secondary | ICD-10-CM

## 2023-07-19 DIAGNOSIS — E785 Hyperlipidemia, unspecified: Secondary | ICD-10-CM | POA: Diagnosis not present

## 2023-07-19 NOTE — Progress Notes (Signed)
 Careteam: Patient Care Team: Sharon Seller, NP as PCP - General (Geriatric Medicine) Jake Bathe, MD as PCP - Cardiology (Cardiology) Lewis Moccasin, MD as Attending Physician (Emergency Medicine) Drema Dallas, DO as Consulting Physician (Neurology) Gilda Crease, Latina Craver, MD (Vascular Surgery) Elwyn Reach (Neurology) [facility], [place]  PLACE OF SERVICE:  [facility]  Advanced Directive information    Allergies  Allergen Reactions   Penicillins Other (See Comments)    Unknown childhood allergic reaction - severe Has patient had a PCN reaction causing immediate rash, facial/tongue/throat swelling, SOB or lightheadedness with hypotension: Unknown Has patient had a PCN reaction causing severe rash involving mucus membranes or skin necrosis: Unknown Has patient had a PCN reaction that required hospitalization: Yes Has patient had a PCN reaction occurring within the last 10 years: No If all of the above answers are "NO", then may proceed with Cephalosporin use.   Xanax [Alprazolam] Shortness Of Breath   Aricept [Donepezil Hcl]    Atorvastatin Other (See Comments)    Difficulty breathing    Bupropion Other (See Comments)    Unknown reaction   Citalopram Other (See Comments)    Unknown reaction   Cymbalta [Duloxetine Hcl] Other (See Comments)    Unknown reaction   Duloxetine Other (See Comments)   Fluoxetine Other (See Comments)    Unknown reaction   Fluvoxamine Other (See Comments)    Unknown reaction   Lexapro [Escitalopram Oxalate] Other (See Comments)    Unknown reaction   Niacin And Related Other (See Comments)    Unknown reaction   Oxcarbazepine Other (See Comments)    Unknown reaction   Pravastatin Other (See Comments)    Unknown reaction   Quetiapine Other (See Comments)   Seroquel [Quetiapine Fumarate] Other (See Comments)    Unknown reaction   Statins Other (See Comments)    Unknown adverse reaction   Tamsulosin Other (See  Comments)    Unknown reaction   Zoloft [Sertraline Hcl] Other (See Comments)    Unknown reaction    Chief Complaint  Patient presents with   Medical Management of Chronic Issues    6 month follow-up. Discussed need for covid booster.    Discussed the use of AI scribe software for clinical note transcription with the patient, who gave verbal consent to proceed.  History of Present Illness   Albert KERI TAVELLA "Ontario Auman" is a 78 year old male with hypertension, coronary artery disease, and COPD who presents for a six month follow-up visit.  He reports no changes since his last visit and is due for blood work today, including cholesterol levels. For cholesterol management, he follows a low cholesterol diet, consuming more vegetables than he prefers. He continues to take Repatha every fourteen days, Zetia daily, and omega-3 fatty acids (fish oil) three times a day. He has lost three pounds recently and currently eats two meals a day due to feeling full quickly.  Regarding hypertension, he is on amlodipine 10 mg daily, carvedilol 3.125 mg twice daily, and lisinopril 40 mg daily. His blood pressure is reported as well controlled.  He has a history of coronary artery disease and is on Plavix and aspirin. No chest pain is reported, and he follows up with his cardiologist routinely, with his next visit scheduled for the fall.  He has COPD and uses Trelegy inhaler one puff daily. He reports excellent breathing with no recent exacerbations and has not needed to use his albuterol rescue  inhaler recently. He has a rescue kit for exacerbations, which he used during a past COVID episode when his breathing became difficult.  He also has a history of dementia, for which he takes Namenda. He reports doing extremely well, with initial confusion resolving quickly. He follows up with neurology, having last seen them six months ago.  He has a history of TIAs and follows up with his vascular doctor for carotid  evaluations. No significant changes in urinary frequency or flow are reported, occasional constipation is managed, and no diarrhea is noted. He typically gets up once or twice at night to use the bathroom.      Review of Systems:  Review of Systems  Constitutional:  Negative for chills, fever and weight loss.  HENT:  Negative for tinnitus.   Respiratory:  Negative for cough, sputum production and shortness of breath.   Cardiovascular:  Negative for chest pain, palpitations and leg swelling.  Gastrointestinal:  Negative for abdominal pain, constipation, diarrhea and heartburn.  Genitourinary:  Negative for dysuria, frequency and urgency.  Musculoskeletal:  Negative for back pain, falls, joint pain and myalgias.  Skin: Negative.   Neurological:  Negative for dizziness and headaches.  Psychiatric/Behavioral:  Negative for depression and memory loss. The patient does not have insomnia.     Past Medical History:  Diagnosis Date   Arteriosclerosis of coronary artery 12/22/2014   Asthma    Benign prostatic hyperplasia without lower urinary tract symptoms 09/13/2012   Cervical spondylosis 02/27/2013   Chronic idiopathic constipation 11/02/2015   Chronic obstructive pulmonary disease 12/22/2014   Coronary artery disease    DDD (degenerative disc disease), cervical 02/27/2013   DDD (degenerative disc disease), lumbosacral 02/27/2013   Dysphagia 10/18/2016   ED (erectile dysfunction)    Generalized anxiety disorder 01/21/2013   GERD (gastroesophageal reflux disease)    Heart murmur    EJECTION MURMUR   HLD (hyperlipidemia)    Hx of colon polyps    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   Thrombocytopenia (HCC)    Vitamin D deficiency    Past Surgical History:  Procedure Laterality Date   ADENOIDECTOMY  2017   ANOMALOUS PULMONARY VENOUS RETURN REPAIR, TOTAL  2021   CAROTID STENT  2007   CAROTID STENT  2001   CATARACT EXTRACTION Bilateral 2017   COLONOSCOPY WITH PROPOFOL N/A 10/31/2016   Procedure: COLONOSCOPY WITH PROPOFOL;  Surgeon: Scot Jun, MD;  Location: [facility] ENDOSCOPY;  Service: Endoscopy;  Laterality: N/A;   CORONARY ANGIOPLASTY     CORONARY ARTERY BYPASS GRAFT  2006   triple   ESOPHAGOGASTRODUODENOSCOPY  2016   LUMBAR FUSION  2001   SEPTOPLASTY     TONSILLECTOMY     Social History:   reports that he quit smoking about 34 years ago. His smoking use included cigarettes. He started smoking about 64 years ago. He has a 30 pack-year smoking history. He has never used smokeless tobacco. He reports that he does not drink alcohol and does not use drugs.  Family History  Problem Relation Age of Onset  Headache Mother    Cancer Father        Lung cancer   Parkinson's disease Paternal Grandmother        Unknown if paternal or maternal side of family   Alzheimer's disease Paternal Grandfather        Unknown if paternal or maternal side of family    Medications: Patient's Medications  New Prescriptions   No medications on file  Previous Medications   ALBUTEROL (VENTOLIN HFA) 108 (90 BASE) MCG/ACT INHALER    Inhale 1-2 puffs into the lungs every 6 (six) hours as needed.   AMLODIPINE (NORVASC) 10 MG TABLET    Take 1 tablet (10 mg total) by mouth every evening.   ASPIRIN EC 81 MG TABLET    Take 81 mg by mouth daily.   AZELASTINE HCL (ASTEPRO) 0.15 % SOLN    Place 2 sprays into the nose at bedtime.   BUDESONIDE (PULMICORT) 1 MG/2ML NEBULIZER SOLUTION    Take 1 mg by nebulization as needed.   CARVEDILOL (COREG) 3.125 MG TABLET    Take 1 tablet (3.125 mg  total) by mouth 2 (two) times daily.   CHOLECALCIFEROL (D3) 50 MCG (2000 UT) TABS    Take 1 tablet by mouth daily.   CLINDAMYCIN (CLEOCIN) 150 MG CAPSULE    Take 150 mg by mouth once. 2 tablets 2 hours prior to Dental Procedures   CLOPIDOGREL (PLAVIX) 75 MG TABLET    TAKE 1 TABLET BY MOUTH DAILY   CYANOCOBALAMIN (VITAMIN B12) 1000 MCG TABLET    Take 1,000 mcg by mouth daily.   DICLOFENAC SODIUM (VOLTAREN) 1 % GEL    Apply topically 4 (four) times daily.   EZETIMIBE (ZETIA) 10 MG TABLET    TAKE 1 TABLET BY MOUTH DAILY   FLUTICASONE-UMECLIDIN-VILANT (TRELEGY ELLIPTA) 100-62.5-25 MCG/ACT AEPB    Inhale 1 puff into the lungs daily.   LISINOPRIL (ZESTRIL) 40 MG TABLET    Take 1 tablet (40 mg total) by mouth daily.   MEMANTINE (NAMENDA) 10 MG TABLET    TAKE 1 TABLET (10 MG TOTAL) BY MOUTH DAILY.   OMEGA-3 FATTY ACIDS (FISH OIL PO)    Take 1 capsule by mouth 3 (three) times daily.   REPATHA SURECLICK 140 MG/ML SOAJ    INJECT 140 MG INTO THE SKIN EVERY 14 DAYS.  Modified Medications   No medications on file  Discontinued Medications   No medications on file    Physical Exam:  Vitals:   07/18/23 1637  BP: 126/80  Pulse: (!) 56  Temp: (!) 97.1 F (36.2 C)  TempSrc: Temporal  SpO2: 99%  Weight: 156 lb (70.8 kg)  Height: 5\' 7"  (1.702 m)   Body mass index is 24.43 kg/m. Wt Readings from Last 3 Encounters:  07/18/23 156 lb (70.8 kg)  01/18/23 159 lb 9.6 oz (72.4 kg)  01/04/23 158 lb (71.7 kg)    Physical Exam Constitutional:      General: He is not in acute distress.    Appearance: He is well-developed. He is not diaphoretic.  HENT:     Head: Normocephalic and atraumatic.     Right Ear: External ear normal.     Left Ear: External ear normal.     Mouth/Throat:     Pharynx: No oropharyngeal exudate.  Eyes:     Conjunctiva/sclera: Conjunctivae normal.     Pupils: Pupils are equal, round, and reactive to light.  Cardiovascular:     Rate and Rhythm: Normal rate and regular  rhythm.      Heart sounds: Normal heart sounds.  Pulmonary:     Effort: Pulmonary effort is normal.     Breath sounds: Normal breath sounds.  Abdominal:     General: Bowel sounds are normal.     Palpations: Abdomen is soft.  Musculoskeletal:        General: No tenderness.     Cervical back: Normal range of motion and neck supple.     Right lower leg: No edema.     Left lower leg: No edema.  Skin:    General: Skin is warm and dry.  Neurological:     Mental Status: He is alert and oriented to person, place, and time.     Labs reviewed: Basic Metabolic Panel: Recent Labs    01/18/23 0923  NA 138  K 5.0  CL 102  CO2 29  GLUCOSE 96  BUN 20  CREATININE 0.90  CALCIUM 9.5   Liver Function Tests: Recent Labs    01/18/23 0923  AST 14  ALT 12  BILITOT 0.6  PROT 7.0   No results for input(s): "LIPASE", "AMYLASE" in the last 8760 hours. No results for input(s): "AMMONIA" in the last 8760 hours. CBC: Recent Labs    01/18/23 0923  WBC 7.3  NEUTROABS 4,716  HGB 15.1  HCT 45.8  MCV 91.2  PLT 252   Lipid Panel: No results for input(s): "CHOL", "HDL", "LDLCALC", "TRIG", "CHOLHDL", "LDLDIRECT" in the last 8760 hours. TSH: No results for input(s): "TSH" in the last 8760 hours. A1C: Lab Results  Component Value Date   HGBA1C 6.1 (H) 01/18/2023     Assessment/Plan 1. Primary hypertension (Primary) -Blood pressure well controlled, goal bp <140/90 Continue current medications and dietary modifications follow metabolic panel - COMPLETE METABOLIC PANEL WITHOUT GFR - CBC with Differential/Platelet  2. Hyperlipidemia, unspecified hyperlipidemia type Due for lipids, continues on rapatha and zetai - Lipid panel - COMPLETE METABOLIC PANEL WITHOUT GFR  3. Mild vascular dementia without behavioral disturbance, psychotic disturbance, mood disturbance, or anxiety (HCC) -Stable, no acute changes in cognitive or functional status, continue supportive care.  Reports he is doing very  well on namenda. Unable to tolerate aricept.   5. Stage 3 severe COPD by GOLD classification (HCC) Stable on trelegy, COVID in January which exacerbated symptoms but did well overall. Due for pulmonary follow up    6. Hyperglycemia Continue dietary modifications.  - Hemoglobin A1c  7. Episode of recurrent major depressive disorder, unspecified depression episode severity (HCC) Stable at this time, not currently on medications  8. Coronary artery disease involving native coronary artery of native heart with angina pectoris (HCC) -stable, no chest pains at this time. Continues on ASA, coreg, plavix, raptha, zetia and lisinopril.  Followed by cardiology    Return in about 6 months (around 01/18/2024) for routine follow up, labs at time of visit.:   Corrissa Martello K. Biagio Borg [facility] [phone]

## 2023-07-19 NOTE — Patient Instructions (Addendum)
 You are due for follow up with pulmonary, can call to make follow up  [facility] at [facility]  Address: [address], [place], [place] [address] Phone: [phone]   To try to eat 3 meals a day. To have protein supplement in addition to smallest meal of the day.

## 2023-07-20 LAB — LIPID PANEL
Cholesterol: 88 mg/dL (ref <200)
HDL: 44 mg/dL (ref >=40)
LDL Cholesterol (Calc): 30 mg/dL
Non-HDL Cholesterol (Calc): 44 mg/dL (ref <130)
Total CHOL/HDL Ratio: 2 (calc) (ref <5.0)
Triglycerides: 60 mg/dL (ref <150)

## 2023-07-20 LAB — CBC WITH DIFFERENTIAL/PLATELET
Absolute Lymphocytes: 2189 {cells}/uL (ref 850–3900)
Absolute Monocytes: 657 {cells}/uL (ref 200–950)
Basophils Absolute: 68 {cells}/uL (ref 0–200)
Basophils Relative: 1.1 %
Eosinophils Absolute: 167 {cells}/uL (ref 15–500)
Eosinophils Relative: 2.7 %
HCT: 45.4 % (ref 38.5–50.0)
Hemoglobin: 14.9 g/dL (ref 13.2–17.1)
MCH: 29.2 pg (ref 27.0–33.0)
MCHC: 32.8 g/dL (ref 32.0–36.0)
MCV: 89 fL (ref 80.0–100.0)
MPV: 9.4 fL (ref 7.5–12.5)
Monocytes Relative: 10.6 %
Neutro Abs: 3119 {cells}/uL (ref 1500–7800)
Neutrophils Relative %: 50.3 %
Platelets: 230 10*3/uL (ref 140–400)
RBC: 5.1 10*6/uL (ref 4.20–5.80)
RDW: 13 % (ref 11.0–15.0)
Total Lymphocyte: 35.3 %
WBC: 6.2 10*3/uL (ref 3.8–10.8)

## 2023-07-20 LAB — HEMOGLOBIN A1C
Hgb A1c MFr Bld: 6.2 %{Hb} — ABNORMAL HIGH (ref <5.7)
Mean Plasma Glucose: 131 mg/dL
eAG (mmol/L): 7.3 mmol/L

## 2023-07-20 LAB — COMPLETE METABOLIC PANEL WITHOUT GFR
AG Ratio: 1.6 (calc) (ref 1.0–2.5)
ALT: 14 U/L (ref 9–46)
AST: 14 U/L (ref 10–35)
Albumin: 4.1 g/dL (ref 3.6–5.1)
Alkaline phosphatase (APISO): 63 U/L (ref 35–144)
BUN: 21 mg/dL (ref 7–25)
CO2: 30 mmol/L (ref 20–32)
Calcium: 9.1 mg/dL (ref 8.6–10.3)
Chloride: 102 mmol/L (ref 98–110)
Creat: 0.97 mg/dL (ref 0.70–1.28)
Globulin: 2.5 g/dL (ref 1.9–3.7)
Glucose, Bld: 93 mg/dL (ref 65–99)
Potassium: 4.5 mmol/L (ref 3.5–5.3)
Sodium: 137 mmol/L (ref 135–146)
Total Bilirubin: 0.6 mg/dL (ref 0.2–1.2)
Total Protein: 6.6 g/dL (ref 6.1–8.1)

## 2023-07-22 ENCOUNTER — Encounter: Payer: Self-pay | Admitting: Nurse Practitioner

## 2023-07-29 ENCOUNTER — Other Ambulatory Visit: Payer: Self-pay | Admitting: *Deleted

## 2023-07-29 ENCOUNTER — Encounter: Payer: Self-pay | Admitting: Cardiology

## 2023-07-29 MED ORDER — CLOPIDOGREL BISULFATE 75 MG PO TABS: 75.0000 mg | ORAL_TABLET | Freq: Every day | ORAL | 2 refills | Status: DC

## 2023-08-01 ENCOUNTER — Encounter: Payer: Self-pay | Admitting: Nurse Practitioner

## 2023-08-01 ENCOUNTER — Encounter: Payer: Medicare Other | Admitting: Nurse Practitioner

## 2023-08-01 NOTE — Progress Notes (Signed)
 This encounter was created in error - please disregard.

## 2023-08-08 ENCOUNTER — Telehealth: Payer: Self-pay | Admitting: *Deleted

## 2023-08-08 ENCOUNTER — Ambulatory Visit: Admitting: Nurse Practitioner

## 2023-08-08 DIAGNOSIS — Z0000 Encounter for general adult medical examination without abnormal findings: Secondary | ICD-10-CM | POA: Diagnosis not present

## 2023-08-08 NOTE — Telephone Encounter (Signed)
 Mr. taysir, crummie are scheduled for a virtual visit with your provider today.    Just as we do with appointments in the office, we must obtain your consent to participate.  Your consent will be active for this visit and any virtual visit you Sharlize Hoar have with one of our providers in the next 365 days.    If you have a MyChart account, I can also send a copy of this consent to you electronically.  All virtual visits are billed to your insurance company just like a traditional visit in the office.  As this is a virtual visit, video technology does not allow for your provider to perform a traditional examination.  This Babs Dabbs limit your provider's ability to fully assess your condition.  If your provider identifies any concerns that need to be evaluated in person or the need to arrange testing such as labs, EKG, etc, we will make arrangements to do so.    Although advances in technology are sophisticated, we cannot ensure that it will always work on either your end or our end.  If the connection with a video visit is poor, we Alenah Sarria have to switch to a telephone visit.  With either a video or telephone visit, we are not always able to ensure that we have a secure connection.   I need to obtain your verbal consent now.   Are you willing to proceed with your visit today?   SARI PETROU has provided verbal consent on 08/08/2023 for a virtual visit (video or telephone).   MayBethena Brothers, [place] 08/08/2023  9:06 AM

## 2023-08-08 NOTE — Progress Notes (Signed)
 This service is provided via telemedicine  No vital signs collected/recorded due to the encounter was a telemedicine visit.   Location of patient (ex: home, work):  Home  Patient consents to a telephone visit:  Yes  Location of the provider (ex: office, home):  Office [place].   Name of any referring provider:  na  Names of all persons participating in the telemedicine service and their role in the encounter:  Matias Soman, patient, Almira Armour May, CMA, Calvin Caulk  Time spent on call:  7:11  Subjective:   Albert Hayes is a 78 y.o. male who presents for Medicare Annual/Subsequent preventive examination.  Visit Complete: Virtual I connected with  Briana Campbell on 08/08/23 by a video and audio enabled telemedicine application and verified that I am speaking with the correct person using two identifiers.  Patient Location: Home  Provider Location: Office/Clinic  I discussed the limitations of evaluation and management by telemedicine. The patient expressed understanding and agreed to proceed.  Vital Signs: Because this visit was a virtual/telehealth visit, some criteria may be missing or patient reported. Any vitals not documented were not able to be obtained and vitals that have been documented are patient reported.   Cardiac Risk Factors include: advanced age (>51men, >21 women)     Objective:    There were no vitals filed for this visit. There is no height or weight on file to calculate BMI.     08/08/2023    9:00 AM 12/04/2022    9:07 AM 09/10/2022    9:46 AM 07/27/2022   11:20 AM 07/04/2022   11:05 AM 06/01/2022    7:49 AM 05/19/2019    8:58 AM  Advanced Directives  Does Patient Have a Medical Advance Directive? Yes No Yes Yes Yes Yes No  Type of Estate agent of [place];Out of facility DNR (pink MOST or yellow form);Living will  Healthcare Power of [place];Living will;Out of facility DNR (pink MOST or yellow form) Healthcare Power of  [place];Living will;Out of facility DNR (pink MOST or yellow form) Healthcare Power of [place];Living will;Out of facility DNR (pink MOST or yellow form)    Does patient want to make changes to medical advance directive? No - Patient declined  No - Patient declined No - Patient declined No - Patient declined    Copy of Healthcare Power of Attorney in Chart? Yes - validated most recent copy scanned in chart (See row information)  No - copy requested No - copy requested No - copy requested    Would patient like information on creating a medical advance directive?  No - Patient declined         Current Medications (verified) Outpatient Encounter Medications as of 08/08/2023  Medication Sig   albuterol  (VENTOLIN  HFA) 108 (90 Base) MCG/ACT inhaler Inhale 1-2 puffs into the lungs every 6 (six) hours as needed.   amLODipine  (NORVASC ) 10 MG tablet Take 1 tablet (10 mg total) by mouth every evening.   aspirin  EC 81 MG tablet Take 81 mg by mouth daily.   Azelastine HCl (ASTEPRO) 0.15 % SOLN Place 2 sprays into the nose at bedtime.   budesonide (PULMICORT) 1 MG/2ML nebulizer solution Take 1 mg by nebulization as needed.   carvedilol  (COREG ) 3.125 MG tablet Take 1 tablet (3.125 mg total) by mouth 2 (two) times daily.   Cholecalciferol (D3) 50 MCG (2000 UT) TABS Take 1 tablet by mouth daily.   clindamycin (CLEOCIN) 150 MG capsule Take 150 mg by mouth  once. 2 tablets 2 hours prior to Dental Procedures   clopidogrel  (PLAVIX ) 75 MG tablet Take 1 tablet (75 mg total) by mouth daily.   cyanocobalamin (VITAMIN B12) 1000 MCG tablet Take 1,000 mcg by mouth daily.   diclofenac  Sodium (VOLTAREN ) 1 % GEL Apply topically 4 (four) times daily.   ezetimibe (ZETIA) 10 MG tablet TAKE 1 TABLET BY MOUTH DAILY   Fluticasone-Umeclidin-Vilant (TRELEGY ELLIPTA ) 100-62.5-25 MCG/ACT AEPB Inhale 1 puff into the lungs daily.   lisinopril  (ZESTRIL ) 40 MG tablet Take 1 tablet (40 mg total) by mouth daily.   memantine  (NAMENDA ) 10 MG  tablet TAKE 1 TABLET (10 MG TOTAL) BY MOUTH DAILY.   Omega-3 Fatty Acids (FISH OIL PO) Take 1 capsule by mouth 3 (three) times daily.   REPATHA  SURECLICK 140 MG/ML SOAJ INJECT 140 MG INTO THE SKIN EVERY 14 DAYS.   No facility-administered encounter medications on file as of 08/08/2023.    Allergies (verified) Penicillins, Xanax [alprazolam], Aricept  [donepezil  hcl], Atorvastatin, Bupropion, Citalopram, Cymbalta [duloxetine hcl], Duloxetine, Fluoxetine, Fluvoxamine, Lexapro [escitalopram oxalate], Niacin and related, Oxcarbazepine, Pravastatin , Quetiapine, Seroquel [quetiapine fumarate], Statins, Tamsulosin, and Zoloft [sertraline hcl]   History: Past Medical History:  Diagnosis Date   Arteriosclerosis of coronary artery 12/22/2014   Asthma    Benign prostatic hyperplasia without lower urinary tract symptoms 09/13/2012   Cervical spondylosis 02/27/2013   Chronic idiopathic constipation 11/02/2015   Chronic obstructive pulmonary disease 12/22/2014   Coronary artery disease    DDD (degenerative disc disease), cervical 02/27/2013   DDD (degenerative disc disease), lumbosacral 02/27/2013   Dysphagia 10/18/2016   ED (erectile dysfunction)    Generalized anxiety disorder 01/21/2013   GERD (gastroesophageal reflux disease)    Heart murmur    EJECTION MURMUR   HLD (hyperlipidemia)    Hx of colon polyps    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   Thrombocytopenia (HCC)    Vitamin D deficiency    Past Surgical History:  Procedure Laterality Date   ADENOIDECTOMY  2017   ANOMALOUS  PULMONARY VENOUS RETURN REPAIR, TOTAL  2021   CAROTID STENT  2007   CAROTID STENT  2001   CATARACT EXTRACTION Bilateral 2017   COLONOSCOPY WITH PROPOFOL  N/A 10/31/2016   Procedure: COLONOSCOPY WITH PROPOFOL ;  Surgeon: Cassie Click, MD;  [facility] END[facility]COPY;  Service: Endoscopy;  Laterality: N/A;   CORONARY ANGIOPLASTY     CORONARY ARTERY BYPASS GRAFT  2006   triple   ESOPHAGOGASTRODUODENOSCOPY  2016   LUMBAR FUSION  2001   SEPTOPLASTY     TONSILLECTOMY     Family History  Problem Relation Age of Onset   Headache Mother    Cancer Father        Lung cancer   Parkinson's disease Paternal Grandmother        Unknown if paternal or maternal side of family   Alzheimer's disease Paternal Grandfather        Unknown if paternal or maternal side of family   Social History   Socioeconomic History   Marital status: Married    Spouse  name: Stana Ear   Number of children: 3   Years of education: 16   Highest education level: Associate degree: academic program  Occupational History   Occupation: Retired Surveyor, minerals  Tobacco Use   Smoking status: Former    Current packs/day: 0.00    Average packs/day: 1 pack/day for 30.0 years (30.0 ttl pk-yrs)    Types: Cigarettes    Start date: 10/31/1958    Quit date: 10/30/1988    Years since quitting: 34.7   Smokeless tobacco: Never  Vaping Use   Vaping status: Never Used  Substance and Sexual Activity   Alcohol use: No    Alcohol/week: 0.0 standard drinks of alcohol   Drug use: No   Sexual activity: Not on file  Other Topics Concern   Not on file  Social History Narrative   Married. Education: high school/other. Exercise: Yes.      Patient is right-handed.   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     Social Drivers of Corporate investment banker Strain: Low Risk  (07/15/2023)   Overall Financial Resource Strain (CARDIA)     Difficulty of Paying Living Expenses: Not hard at all  Food Insecurity: No Food Insecurity (07/15/2023)   Hunger Vital Sign    Worried About Running Out of Food in the Last Year: Never true    Ran Out of Food in the Last Year: Never true  Transportation Needs: No Transportation Needs (07/15/2023)   PRAPARE - Administrator, Civil Service (Medical): No    Lack of Transportation (Non-Medical): No  Physical Activity: Sufficiently Active (07/15/2023)   Exercise Vital Sign    Days of Exercise per Week: 3 days    Minutes of Exercise per Session: 150+ min  Stress: No Stress Concern Present (07/15/2023)   [employer] of Occupational Health - Occupational Stress Questionnaire    Feeling of Stress : Only a little  Social Connections: Socially Integrated (07/15/2023)   Social Connection and Isolation Panel [NHANES]    Frequency of Communication with Friends and Family: Twice a week    Frequency of Social Gatherings with Friends and Family: More than three times a week    Attends Religious Services: More than 4 times per year    Active Member of [employer] or Organizations: Yes    Attends Engineer, structural: More than 4 times per year    Marital Status: Married    Tobacco Counseling Counseling given: Not Answered   Clinical Intake:  Pre-visit preparation completed: Yes  Pain : No/denies pain     BMI - recorded: 24 Nutritional Risks: None Diabetes: No  How often do you need to have someone help you when you read instructions, pamphlets, or other written materials from your doctor or pharmacy?: 1 - Never         Activities of Daily Living    08/08/2023    9:01 AM 08/05/2023    6:32 AM  In your present state of health, do you have any difficulty performing the following activities:  Hearing? 0 0  Vision? 0 0  Difficulty concentrating or making decisions? 0 0  Walking or climbing stairs? 0 0  Dressing or bathing? 0   Doing errands, shopping? 0 0  Preparing Food  and eating ? N N  Using the Toilet? N N  In the past  six months, have you accidently leaked urine? N N  Do you have problems with loss of bowel control? N N  Managing your Medications? N N  Managing your Finances? N N  Housekeeping or managing your Housekeeping? N N    Patient Care Team: Verma Gobble, NP as PCP - General (Geriatric Medicine) Hugh Madura, MD as PCP - Cardiology (Cardiology) Aleta Anda, MD as Attending Physician (Emergency Medicine) Merriam Abbey, DO as Consulting Physician (Neurology) Prescilla Brod, Ninette Basque, MD (Vascular Surgery) Wertman, Sara E, PA-C (Neurology) [facility] [place]  Indicate any recent Medical Services you may have received from other than [facility] providers in the past year (date may be approximate).     Assessment:   This is a routine wellness examination for C[place]il.  Hearing/Vision screen Vision Screening - Comments:: [facility] Last Exam: 06/2023   Goals Addressed   None    Depression Screen    08/08/2023    8:59 AM 07/27/2022   11:19 AM 07/04/2022   11:02 AM 10/14/2015    8:59 AM 05/25/2015    1:16 PM 02/14/2015    1:52 PM  PHQ 2/9 Scores  PHQ - 2 Score 0 0 0 0 0 0    Fall Risk    08/08/2023    8:59 AM 08/05/2023    6:32 AM 01/18/2023    8:29 AM 12/04/2022    9:07 AM 09/10/2022    9:46 AM  Fall Risk   Falls in the past year? 0 1 0 0 0  Number falls in past yr: 0 0 0 0 0  Injury with Fall? 0 0 0 0 0  Risk for fall due to : No Fall Risks  No Fall Risks  No Fall Risks  Follow up Falls evaluation completed   Falls evaluation completed Falls evaluation completed    MEDICARE RISK AT HOME: Medicare Risk at Home Any stairs in or around the home?: Yes If so, are there any without handrails?: No Home free of loose throw rugs in walkways, pet beds, electrical cords, etc?: Yes Adequate lighting in your home to reduce risk of falls?: Yes Life alert?: No Use of a cane, walker or w/c?: No Grab bars in the bathroom?:  No Shower chair or bench in shower?: No Elevated toilet seat or a handicapped toilet?: No  TIMED UP AND GO:  Was the test performed?  No    Cognitive Function:    12/04/2022   12:00 PM  MMSE - Mini Mental State Exam  Orientation to time 5  Orientation to Place 5  Registration 3  Attention/ Calculation 5  Recall 3  Language- name 2 objects 2  Language- repeat 1  Language- follow 3 step command 3  Language- read & follow direction 1  Write a sentence 1  Copy design 1  Total score 30      06/01/2022   10:00 AM  Montreal Cognitive Assessment   Visuospatial/ Executive (0/5) 4  Naming (0/3) 3  Attention: Read list of digits (0/2) 2  Attention: Read list of letters (0/1) 1  Attention: Serial 7 subtraction starting at 100 (0/3) 3  Language: Repeat phrase (0/2) 1  Language : Fluency (0/1) 1  Abstraction (0/2) 1  Delayed Recall (0/5) 4  Orientation (0/6) 6  Total 26  Adjusted Score (based on education) 26      08/08/2023    9:02 AM 07/04/2022   11:04 AM  6CIT Screen  What Year?  0 points 0 points  What month? 0 points 0 points  What time? 0 points 0 points  Count back from 20 0 points 0 points  Months in reverse 0 points 0 points  Repeat phrase 0 points 4 points  Total Score 0 points 4 points    Immunizations Immunization History  Administered Date(s) Administered   Fluad Trivalent(High Dose 65+) 01/18/2023   Influenza, High Dose Seasonal PF 12/08/2014, 01/03/2016, 11/28/2016, 11/25/2018, 12/11/2019, 01/22/2022   Influenza,inj,quad, With Preservative 02/11/2018   Influenza-Unspecified 12/15/2013, 12/08/2014, 02/04/2015, 01/03/2016, 12/11/2019, 01/02/2021   PFIZER Comirnaty(Gray Top)Covid-19 Tri-Sucrose Vaccine 01/10/2020   PFIZER(Purple Top)SARS-COV-2 Vaccination 05/23/2019, 06/14/2019   PNEUMOCOCCAL CONJUGATE-20 01/22/2022   Pfizer(Comirnaty)Fall Seasonal Vaccine 12 years and older 03/13/2022   Pneumococcal Conjugate,unspecified 04/20/2014   Pneumococcal  Conjugate-13 04/20/2012, 04/20/2014   Pneumococcal Polysaccharide-23 02/22/2020   Pneumococcal-Unspecified 01/07/2014   Rsv, Bivalent, Protein Subunit Rsvpref,pf Pattricia Bores) 01/22/2022   Tdap 04/09/2008, 04/20/2014   Zoster Recombinant(Shingrix) 08/09/2016, 12/20/2016   Zoster, Live 06/07/2013, 08/09/2016    TDAP status: Up to date  Flu Vaccine status: Up to date  Pneumococcal vaccine status: Up to date  Covid-19 vaccine status: Information provided on how to obtain vaccines.   Qualifies for Shingles Vaccine? Yes   Zostavax completed No   Shingrix Completed?: Yes  Screening Tests Health Maintenance  Topic Date Due   COVID-19 Vaccine (5 - 2024-25 season) 01/08/2024 (Originally 12/09/2022)   INFLUENZA VACCINE  11/08/2023   DTaP/Tdap/Td (3 - Td or Tdap) 04/20/2024   Medicare Annual Wellness (AWV)  08/07/2024   Pneumonia Vaccine 51+ Years old  Completed   Hepatitis C Screening  Completed   Zoster Vaccines- Shingrix  Completed   HPV VACCINES  Aged Out   Meningococcal B Vaccine  Aged Out   Colonoscopy  Discontinued    Health Maintenance  There are no preventive care reminders to display for this patient.   Colorectal cancer screening: No longer required.   Lung Cancer Screening: (Low Dose CT Chest recommended if Age 30-80 years, 20 pack-year currently smoking OR have quit w/in 15years.) does not qualify.   Lung Cancer Screening Referral: na  Additional Screening:  Hepatitis C Screening: does qualify; Completed   Vision Screening: Recommended annual ophthalmology exams for early detection of glaucoma and other disorders of the eye. Is the patient up to date with their annual eye exam?  Yes  Who is the provider or what is the name of the office in which the patient attends annual eye exams? Dr.Oman  If pt is not established with a provider, would they like to be referred to a provider to establish care? No .   Dental Screening: Recommended annual dental exams for proper  oral hygiene    Community Resource Referral / Chronic Care Management: CRR required this visit?  No   CCM required this visit?  No     Plan:     I have personally reviewed and noted the following in the patient's chart:   Medical and social history Use of alcohol, tobacco or illicit drugs  Current medications and supplements including opioid prescriptions. Patient is not currently taking opioid prescriptions. Functional ability and status Nutritional status Physical activity Advanced directives List of other physicians Hospitalizations, surgeries, and ER visits in previous 12 months Vitals Screenings to include cognitive, depression, and falls Referrals and appointments  In addition, I have reviewed and discussed with patient certain preventive protocols, quality metrics, and best practice recommendations. A written personalized care plan for preventive services as  well as general preventive health recommendations were provided to patient.     Verma Gobble, NP   08/08/2023   After Visit Summary: (MyChart) Due to this being a telephonic visit, the after visit summary with patients personalized plan was offered to patient via MyChart

## 2023-08-20 ENCOUNTER — Other Ambulatory Visit: Payer: Self-pay | Admitting: Cardiology

## 2023-08-20 ENCOUNTER — Encounter: Payer: Self-pay | Admitting: Cardiology

## 2023-08-20 MED ORDER — EZETIMIBE 10 MG PO TABS: 10.0000 mg | ORAL_TABLET | Freq: Every day | ORAL | 1 refills | Status: DC

## 2023-08-22 NOTE — Progress Notes (Signed)
 MRN : [id number]  Albert Hayes is a 78 y.o. (06-Oct-1945) male who presents with chief complaint of check circulation.  History of Present Illness:   The patient is seen for evaluation of carotid stenosis. The carotid stenosis was identified remotely.   The patient denies amaurosis fugax. There is no recent history of TIA symptoms or focal motor deficits. There is no prior documented CVA.   There is no history of migraine headaches. There is no history of seizures.   The patient is taking enteric-coated aspirin  81 mg daily.   No recent shortening of the patient's walking distance or new symptoms consistent with claudication.  No history of rest pain symptoms. No new ulcers or wounds of the lower extremities have occurred.   There is no history of DVT, PE or superficial thrombophlebitis. No recent episodes of angina or shortness of breath documented.     Previous duplex ultrasound done here today shows improvement in the appearance of the right internal carotid artery now reported as some thickening with no plaque visualized.  The left internal carotid artery appears to have resolved the injury associated with TAVR placement and is 1 to 39%.  This appears to be a slight improvement over the last year study which was reported as 1-39% bilateral ICA stenosis.  Duplex ultrasound obtained today demonstrates RICA 1-39% and LICA 1-39%.  Bilateral antegrade vertebral artery flow.  No change compared to previous study.  No outpatient medications have been marked as taking for the 08/26/23 encounter (Appointment) with Prescilla Brod, Ninette Basque, MD.    Past Medical History:  Diagnosis Date   Arteriosclerosis of coronary artery 12/22/2014   Asthma    Benign prostatic hyperplasia without lower urinary tract symptoms 09/13/2012   Cervical spondylosis 02/27/2013   Chronic idiopathic constipation 11/02/2015   Chronic obstructive  pulmonary disease 12/22/2014   Coronary artery disease    DDD (degenerative disc disease), cervical 02/27/2013   DDD (degenerative disc disease), lumbosacral 02/27/2013   Dysphagia 10/18/2016   ED (erectile dysfunction)    Generalized anxiety disorder 01/21/2013   GERD (gastroesophageal reflux disease)    Heart murmur    EJECTION MURMUR   HLD (hyperlipidemia)    Hx of colon polyps    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   Thrombocytopenia (HCC)    Vitamin D deficiency     Past Surgical History:  Procedure Laterality Date   ADENOIDECTOMY  2017   ANOMALOUS PULMONARY VENOUS RETURN REPAIR, TOTAL  2021   CAROTID STENT  2007   CAROTID STENT  2001   CATARACT EXTRACTION Bilateral 2017   COLONOSCOPY WITH PROPOFOL  N/A 10/31/2016   Procedure: COLONOSCOPY WITH PROPOFOL ;  Surgeon:  Cassie Click, MD;  Location: [facility] ENDOSCOPY;  Service: Endoscopy;  Laterality: N/A;   CORONARY ANGIOPLASTY     CORONARY ARTERY BYPASS GRAFT  2006   triple   ESOPHAGOGASTRODUODENOSCOPY  2016   LUMBAR FUSION  2001   SEPTOPLASTY     TONSILLECTOMY      Social History Social History   Tobacco Use   Smoking status: Former    Current packs/day: 0.00    Average packs/day: 1 pack/day for 30.0 years (30.0 ttl pk-yrs)    Types: Cigarettes    Start date: 10/31/1958    Quit date: 10/30/1988    Years since quitting: 34.8   Smokeless tobacco: Never  Vaping Use   Vaping status: Never Used  Substance Use Topics   Alcohol use: No    Alcohol/week: 0.0 standard drinks of alcohol   Drug use: No    Family  History Family History  Problem Relation Age of Onset   Headache Mother    Cancer Father        Lung cancer   Parkinson's disease Paternal Grandmother        Unknown if paternal or maternal side of family   Alzheimer's disease Paternal Grandfather        Unknown if paternal or maternal side of family    Allergies  Allergen Reactions   Penicillins Other (See Comments)    Unknown childhood allergic reaction - severe Has patient had a PCN reaction causing immediate rash, facial/tongue/throat swelling, SOB or lightheadedness with hypotension: Unknown Has patient had a PCN reaction causing severe rash involving mucus membranes or skin necrosis: Unknown Has patient had a PCN reaction that required hospitalization: Yes Has patient had a PCN reaction occurring within the last 10 years: No If all of the above answers are "NO", then may proceed with Cephalosporin use.   Xanax [Alprazolam] Shortness Of Breath   Aricept  [Donepezil  Hcl]    Atorvastatin Other (See Comments)    Difficulty breathing    Bupropion Other (See Comments)    Unknown reaction   Citalopram Other (See Comments)    Unknown reaction   Cymbalta [Duloxetine Hcl] Other (See Comments)    Unknown reaction   Duloxetine Other (See Comments)   Fluoxetine Other (See Comments)    Unknown reaction   Fluvoxamine Other (See Comments)    Unknown reaction   Lexapro [Escitalopram Oxalate] Other (See Comments)    Unknown reaction   Niacin And Related Other (See Comments)    Unknown reaction   Oxcarbazepine Other (See Comments)    Unknown reaction   Pravastatin  Other (See Comments)    Unknown reaction   Quetiapine Other (See Comments)   Seroquel [Quetiapine Fumarate] Other (See Comments)    Unknown reaction   Statins Other (See Comments)    Unknown adverse reaction   Tamsulosin Other (See Comments)    Unknown reaction   Zoloft [Sertraline Hcl] Other (See Comments)    Unknown reaction     REVIEW OF SYSTEMS (Negative unless  checked)  Constitutional: [] Weight loss  [] Fever  [] Chills Cardiac: [] Chest pain   [] Chest pressure   [] Palpitations   [] Shortness of breath when laying flat   [] Shortness of breath with exertion. Vascular:  [x] Pain in legs with walking   [] Pain in legs at rest  [] History of DVT   [] Phlebitis   [] Swelling in legs   [] Varicose veins   [] Non-healing ulcers Pulmonary:   [] Uses home oxygen   [] Productive cough   [] Hemoptysis   [] Wheeze  []   COPD   [] Asthma Neurologic:  [] Dizziness   [] Seizures   [] History of stroke   [] History of TIA  [] Aphasia   [] Vissual changes   [] Weakness or numbness in arm   [] Weakness or numbness in leg Musculoskeletal:   [] Joint swelling   [x] Joint pain   [x] Low back pain Hematologic:  [] Easy bruising  [] Easy bleeding   [] Hypercoagulable state   [] Anemic Gastrointestinal:  [] Diarrhea   [] Vomiting  [x] Gastroesophageal reflux/heartburn   [] Difficulty swallowing. Genitourinary:  [] Chronic kidney disease   [] Difficult urination  [] Frequent urination   [] Blood in urine Skin:  [] Rashes   [] Ulcers  Psychological:  [] History of anxiety   []  History of major depression.  Physical Examination  There were no vitals filed for this visit. There is no height or weight on file to calculate BMI. Gen: WD/WN, NAD Head: /AT, No temporalis wasting.  Ear/Nose/Throat: Hearing grossly intact, nares w/o erythema or drainage Eyes: PER, EOMI, sclera nonicteric.  Neck: Supple, no masses.  No bruit or JVD.  Pulmonary:  Good air movement, no audible wheezing, no use of accessory muscles.  Cardiac: RRR, normal S1, S2, no Murmurs. Vascular:   Vessel Right Left  Radial Palpable Palpable  carotid Palpable Palpable  Gastrointestinal: soft, non-distended. No guarding/no peritoneal signs.  Musculoskeletal: M/S 5/5 throughout.  No visible deformity.  Neurologic: CN 2-12 intact. Pain and light touch intact in extremities.  Symmetrical.  Speech is fluent. Motor exam as listed above. Psychiatric:  Judgment intact, Mood & affect appropriate for pt's clinical situation. Dermatologic: No rashes or ulcers noted.  No changes consistent with cellulitis.   CBC Lab Results  Component Value Date   WBC 6.2 07/19/2023   HGB 14.9 07/19/2023   HCT 45.4 07/19/2023   MCV 89.0 07/19/2023   PLT 230 07/19/2023    BMET    Component Value Date/Time   NA 137 07/19/2023 0928   K 4.5 07/19/2023 0928   CL 102 07/19/2023 0928   CO2 30 07/19/2023 0928   GLUCOSE 93 07/19/2023 0928   BUN 21 07/19/2023 0928   CREATININE 0.97 07/19/2023 0928   CALCIUM 9.1 07/19/2023 0928   GFRNONAA >60 11/15/2017 1135   GFRNONAA 87 10/14/2015 1012   GFRAA >60 11/15/2017 1135   GFRAA >89 10/14/2015 1012   CrCl cannot be calculated (Patient's most recent lab result is older than the maximum 21 days allowed.).  COAG Lab Results  Component Value Date   INR 0.96 11/15/2017    Radiology No results found.   Assessment/Plan 1. Stenosis of right carotid artery (Primary) Recommend:   Given the patient's asymptomatic subcritical stenosis no further invasive testing or surgery at this time.   Duplex ultrasound done here today shows improvement in the appearance of the right internal carotid artery now reported as some thickening with no plaque visualized.  The left internal carotid artery appears to have resolved the injury associated with TAVR placement and is 1 to 39%.  This appears to be a slight improvement over the last year study which was reported as 1-39% bilateral ICA stenosis   Continue antiplatelet therapy as prescribed Continue management of CAD, HTN and Hyperlipidemia Healthy heart diet,  encouraged exercise at least 4 times per week Follow up in 12 months with duplex ultrasound and physical exam  - VAS US  CAROTID; Future  2. Arteriosclerosis of coronary artery Continue cardiac and antihypertensive medications as already ordered and reviewed, no changes at this time.  Continue statin as ordered  and reviewed, no changes at  this time  Nitrates PRN for chest pain  3. Primary hypertension Continue antihypertensive medications as already ordered, these medications have been reviewed and there are no changes at this time.  4. Chronic obstructive pulmonary disease, unspecified COPD type (HCC) Continue pulmonary medications and aerosols as already ordered, these medications have been reviewed and there are no changes at this time.   5. Hyperlipidemia, unspecified hyperlipidemia type Continue statin as ordered and reviewed, no changes at this time    Devon Fogo, MD  08/22/2023 11:06 AM

## 2023-08-23 ENCOUNTER — Other Ambulatory Visit: Payer: Self-pay | Admitting: *Deleted

## 2023-08-23 ENCOUNTER — Encounter: Payer: Self-pay | Admitting: Internal Medicine

## 2023-08-23 MED ORDER — TRELEGY ELLIPTA 100-62.5-25 MCG/ACT IN AEPB: 1.0000 | INHALATION_SPRAY | Freq: Every day | RESPIRATORY_TRACT | 0 refills | Status: DC

## 2023-08-23 NOTE — Progress Notes (Signed)
trele 

## 2023-08-26 ENCOUNTER — Encounter (INDEPENDENT_AMBULATORY_CARE_PROVIDER_SITE_OTHER): Payer: Self-pay | Admitting: Vascular Surgery

## 2023-08-26 ENCOUNTER — Ambulatory Visit (INDEPENDENT_AMBULATORY_CARE_PROVIDER_SITE_OTHER): Payer: Medicare Other

## 2023-08-26 ENCOUNTER — Encounter: Payer: Self-pay | Admitting: Nurse Practitioner

## 2023-08-26 ENCOUNTER — Ambulatory Visit (INDEPENDENT_AMBULATORY_CARE_PROVIDER_SITE_OTHER): Payer: Medicare Other | Admitting: Vascular Surgery

## 2023-08-26 ENCOUNTER — Telehealth: Admitting: Nurse Practitioner

## 2023-08-26 VITALS — Ht 67.0 in | Wt 156.0 lb

## 2023-08-26 VITALS — BP 103/57 | HR 54 | Resp 18 | Ht 67.0 in | Wt 156.8 lb

## 2023-08-26 DIAGNOSIS — E785 Hyperlipidemia, unspecified: Secondary | ICD-10-CM | POA: Diagnosis not present

## 2023-08-26 DIAGNOSIS — J449 Chronic obstructive pulmonary disease, unspecified: Secondary | ICD-10-CM

## 2023-08-26 DIAGNOSIS — I10 Essential (primary) hypertension: Secondary | ICD-10-CM | POA: Diagnosis not present

## 2023-08-26 DIAGNOSIS — I6521 Occlusion and stenosis of right carotid artery: Secondary | ICD-10-CM | POA: Diagnosis not present

## 2023-08-26 DIAGNOSIS — K5904 Chronic idiopathic constipation: Secondary | ICD-10-CM | POA: Diagnosis not present

## 2023-08-26 DIAGNOSIS — F01A0 Vascular dementia, mild, without behavioral disturbance, psychotic disturbance, mood disturbance, and anxiety: Secondary | ICD-10-CM | POA: Diagnosis not present

## 2023-08-26 DIAGNOSIS — I2510 Atherosclerotic heart disease of native coronary artery without angina pectoris: Secondary | ICD-10-CM | POA: Diagnosis not present

## 2023-08-26 MED ORDER — MEMANTINE HCL 10 MG PO TABS: 10.0000 mg | ORAL_TABLET | Freq: Two times a day (BID) | ORAL | 2 refills | Status: AC

## 2023-08-26 NOTE — Progress Notes (Signed)
 Careteam: Patient Care Team: Verma Gobble, NP as PCP - General (Geriatric Medicine) Hugh Madura, MD as PCP - Cardiology (Cardiology) Aleta Anda, MD as Attending Physician (Emergency Medicine) Merriam Abbey, DO as Consulting Physician (Neurology) Prescilla Brod, Ninette Basque, MD (Vascular Surgery) Emory Harps (Neurology) [facility] [place]  Advanced Directive information    Allergies  Allergen Reactions   Penicillins Other (See Comments)    Unknown childhood allergic reaction - severe Has patient had a PCN reaction causing immediate rash, facial/tongue/throat swelling, SOB or lightheadedness with hypotension: Unknown Has patient had a PCN reaction causing severe rash involving mucus membranes or skin necrosis: Unknown Has patient had a PCN reaction that required hospitalization: Yes Has patient had a PCN reaction occurring within the last 10 years: No If all of the above answers are "NO", then may proceed with Cephalosporin use.   Xanax [Alprazolam] Shortness Of Breath   Aricept  [Donepezil  Hcl]    Atorvastatin Other (See Comments)    Difficulty breathing    Bupropion Other (See Comments)    Unknown reaction   Citalopram Other (See Comments)    Unknown reaction   Cymbalta [Duloxetine Hcl] Other (See Comments)    Unknown reaction   Duloxetine Other (See Comments)   Fluoxetine Other (See Comments)    Unknown reaction   Fluvoxamine Other (See Comments)    Unknown reaction   Lexapro [Escitalopram Oxalate] Other (See Comments)    Unknown reaction   Niacin And Related Other (See Comments)    Unknown reaction   Oxcarbazepine Other (See Comments)    Unknown reaction   Pravastatin  Other (See Comments)    Unknown reaction   Quetiapine Other (See Comments)   Seroquel [Quetiapine Fumarate] Other (See Comments)    Unknown reaction   Statins Other (See Comments)    Unknown adverse reaction   Tamsulosin Other (See Comments)    Unknown reaction    Zoloft [Sertraline Hcl] Other (See Comments)    Unknown reaction    Chief Complaint  Patient presents with   Medication Problem    Medication for constipation , going about 4 weeks ,tried otc medication hasn't and hasn't helped     Discussed the use of AI scribe software for clinical note transcription with the patient, who gave verbal consent to proceed.  History of Present Illness Albert Hayes is a 78 year old male with dementia who presents with constipation and cognitive decline.  He has been experiencing constipation, initially manageable, but with episodes of difficulty approximately a week after his last visit and another episode occurring on either Saturday or Sunday. The initial part of the bowel movement is described as 'hard,' followed by regular, soft stool. He has been using Dulcolax chewables and Senokot, and during severe episodes, he resorted to Exlax. He drinks plenty of fluids.  He has a history of cognitive decline, characterized by slowness in understanding simple things and confusion. An MRI confirmed the diagnosis of dementia. He has been on Namenda , taking 10 mg daily in the morning. Symptoms of cognitive decline are starting to 'creep back in.' He is physically active, walking 10 to 15 miles a week, and engages in mental activities such as complex games.    Review of Systems:  Review of Systems  Constitutional:  Negative for chills and fever.  Gastrointestinal:  Positive for constipation.  Psychiatric/Behavioral:  Positive for memory loss.     Past Medical History:  Diagnosis Date   Arteriosclerosis  of coronary artery 12/22/2014   Asthma    Benign prostatic hyperplasia without lower urinary tract symptoms 09/13/2012   Cervical spondylosis 02/27/2013   Chronic idiopathic constipation 11/02/2015   Chronic obstructive pulmonary disease 12/22/2014   Coronary artery disease    DDD (degenerative disc disease), cervical 02/27/2013   DDD (degenerative disc  disease), lumbosacral 02/27/2013   Dysphagia 10/18/2016   ED (erectile dysfunction)    Generalized anxiety disorder 01/21/2013   GERD (gastroesophageal reflux disease)    Heart murmur    EJECTION MURMUR   HLD (hyperlipidemia)    Hx of colon polyps    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   Thrombocytopenia (HCC)    Vitamin D deficiency    Past Surgical History:  Procedure Laterality Date   ADENOIDECTOMY  2017   ANOMALOUS PULMONARY VENOUS RETURN REPAIR, TOTAL  2021   CAROTID STENT  2007   CAROTID STENT  2001   CATARACT EXTRACTION Bilateral 2017   COLONOSCOPY WITH PROPOFOL  N/A 10/31/2016   Procedure: COLONOSCOPY WITH PROPOFOL ;  Surgeon: Cassie Click, MD;  Location: [facility] ENDOSCOPY;  Service: Endoscopy;  Laterality: N/A;   CORONARY ANGIOPLASTY     CORONARY ARTERY BYPASS GRAFT  2006   triple   ESOPHAGOGASTRODUODENOSCOPY  2016   LUMBAR FUSION  2001   SEPTOPLASTY     TONSILLECTOMY     Social History:   reports that he quit smoking about 34 years ago. His smoking use included cigarettes. He started smoking about 64 years ago. He has a 30 pack-year smoking history. He has never used smokeless tobacco. He reports that he does not drink alcohol and does not use drugs.  Family History  Problem Relation Age of Onset   Headache Mother    Cancer Father        Lung cancer   Parkinson's disease Paternal Grandmother        Unknown if paternal or maternal side of family   Alzheimer's disease Paternal Grandfather        Unknown if paternal or maternal side of family     Medications: Patient's Medications  New Prescriptions   No medications on file  Previous Medications   ALBUTEROL  (VENTOLIN  HFA) 108 (90 BASE) MCG/ACT INHALER    Inhale 1-2 puffs into the lungs every 6 (six) hours as needed.   AMLODIPINE  (NORVASC ) 10 MG TABLET    Take 1 tablet (10 mg total) by mouth every evening.   ASPIRIN  EC 81 MG TABLET    Take 81 mg by mouth daily.   AZELASTINE HCL (ASTEPRO) 0.15 % SOLN    Place 2 sprays into the nose at bedtime.   BUDESONIDE (PULMICORT) 1 MG/2ML NEBULIZER SOLUTION    Take 1 mg by nebulization as needed.   CARVEDILOL  (COREG ) 3.125 MG TABLET    Take 1 tablet (3.125 mg total) by mouth 2 (two) times daily.   CHOLECALCIFEROL (D3) 50 MCG (2000 UT) TABS    Take 1 tablet by mouth daily.   CLINDAMYCIN (CLEOCIN) 150 MG CAPSULE    Take 150 mg by mouth once.  2 tablets 2 hours prior to Dental Procedures   CLOPIDOGREL  (PLAVIX ) 75 MG TABLET    Take 1 tablet (75 mg total) by mouth daily.   CYANOCOBALAMIN (VITAMIN B12) 1000 MCG TABLET    Take 1,000 mcg by mouth daily.   DICLOFENAC  SODIUM (VOLTAREN ) 1 % GEL    Apply topically 4 (four) times daily.   EZETIMIBE  (ZETIA ) 10 MG TABLET    Take 1 tablet (10 mg total) by mouth daily.   FLUTICASONE-UMECLIDIN-VILANT (TRELEGY ELLIPTA ) 100-62.5-25 MCG/ACT AEPB    Inhale 1 puff into the lungs daily.   LISINOPRIL  (ZESTRIL ) 40 MG TABLET    Take 1 tablet (40 mg total) by mouth daily.   MEMANTINE  (NAMENDA ) 10 MG TABLET    TAKE 1 TABLET (10 MG TOTAL) BY MOUTH DAILY.   OMEGA-3 FATTY ACIDS (FISH OIL PO)    Take 1 capsule by mouth 3 (three) times daily.   REPATHA  SURECLICK 140 MG/ML SOAJ    INJECT 140 MG INTO THE SKIN EVERY 14 DAYS.  Modified Medications   No medications on file  Discontinued Medications   No medications on file    Physical Exam:  Vitals:   08/26/23 1441  Weight: 156 lb (70.8 kg)  Height: 5\' 7"  (1.702 m)   Body mass index is 24.43 kg/m. Wt Readings from Last 3 Encounters:  08/26/23 156 lb (70.8 kg)   08/26/23 156 lb 12.8 oz (71.1 kg)  07/18/23 156 lb (70.8 kg)    Physical Exam Constitutional:      Appearance: Normal appearance.  Neurological:     Mental Status: He is alert. Mental status is at baseline.  Psychiatric:        Mood and Affect: Mood normal.     Labs reviewed: Basic Metabolic Panel: Recent Labs    01/18/23 0923 07/19/23 0928  NA 138 137  K 5.0 4.5  CL 102 102  CO2 29 30  GLUCOSE 96 93  BUN 20 21  CREATININE 0.90 0.97  CALCIUM 9.5 9.1   Liver Function Tests: Recent Labs    01/18/23 0923 07/19/23 0928  AST 14 14  ALT 12 14  BILITOT 0.6 0.6  PROT 7.0 6.6   No results for input(s): "LIPASE", "AMYLASE" in the last 8760 hours. No results for input(s): "AMMONIA" in the last 8760 hours. CBC: Recent Labs    01/18/23 0923 07/19/23 0928  WBC 7.3 6.2  NEUTROABS 4,716 3,119  HGB 15.1 14.9  HCT 45.8 45.4  MCV 91.2 89.0  PLT 252 230   Lipid Panel: Recent Labs    07/19/23 0928  CHOL 88  HDL 44  LDLCALC 30  TRIG 60  CHOLHDL 2.0   TSH: No results for input(s): "TSH" in the last 8760 hours. A1C: Lab Results  Component Value Date   HGBA1C 6.2 (H) 07/19/2023    Assessment & Plan Dementia Progressive cognitive decline with need for medication adjustment. - Increase Namenda  to 10 mg in the morning and 5 mg in the evening for one week, then 10 mg twice daily. - Send Namenda  refill to pharmacy. - Encouraged staying active, eating healthy, and engaging in mental activities.  Constipation Intermittent constipation with hard initial stool. Previous laxatives ineffective. Miralax recommended for regularity. - Start Miralax daily. - Adjust Miralax based on stool consistency to avoid loose stools. - Ensure adequate fluid intake.   Benn Tarver K. Denney Fisherman  [facility] [phone]    Virtual Visit via video  I connected with patient  on 08/26/23 at  2:40 PM EDT by mychart and verified that I am speaking with the  correct person using two identifiers.  Location: Patient: home Provider: [facility]   I discussed the limitations, risks, security and privacy concerns of performing an evaluation and management service by telephone and the availability of in person appointments. I also discussed with the patient that there may be a patient responsible charge related to this service. The patient expressed understanding and agreed to proceed.   I discussed the assessment and treatment plan with the patient. The patient was provided an opportunity to ask questions and all were answered. The patient agreed with the plan and demonstrated an understanding of the instructions.   The patient was advised to call back or seek an in-person evaluation if the symptoms worsen or if the condition fails to improve as anticipated.  I provided 15 minutes of non-face-to-face time during this encounter.  Arlis Yale K. Denney Fisherman Avs printed and mailed

## 2023-08-27 ENCOUNTER — Encounter (INDEPENDENT_AMBULATORY_CARE_PROVIDER_SITE_OTHER): Payer: Self-pay

## 2023-09-23 ENCOUNTER — Encounter: Payer: Self-pay | Admitting: Nurse Practitioner

## 2023-09-25 ENCOUNTER — Ambulatory Visit: Admitting: Internal Medicine

## 2023-09-25 ENCOUNTER — Encounter: Payer: Self-pay | Admitting: Internal Medicine

## 2023-09-25 VITALS — BP 116/65 | HR 53 | Temp 97.7°F | Ht 67.0 in | Wt 157.0 lb

## 2023-09-25 DIAGNOSIS — J4489 Other specified chronic obstructive pulmonary disease: Secondary | ICD-10-CM

## 2023-09-25 DIAGNOSIS — Z87891 Personal history of nicotine dependence: Secondary | ICD-10-CM | POA: Diagnosis not present

## 2023-09-25 DIAGNOSIS — J439 Emphysema, unspecified: Secondary | ICD-10-CM

## 2023-09-25 MED ORDER — ALBUTEROL SULFATE (2.5 MG/3ML) 0.083% IN NEBU: 2.5000 mg | INHALATION_SOLUTION | Freq: Four times a day (QID) | RESPIRATORY_TRACT | 12 refills | Status: AC | PRN

## 2023-09-25 MED ORDER — TRELEGY ELLIPTA 100-62.5-25 MCG/ACT IN AEPB: 1.0000 | INHALATION_SPRAY | Freq: Every day | RESPIRATORY_TRACT | 11 refills | Status: AC

## 2023-09-25 NOTE — Progress Notes (Signed)
 Albert Hayes    [id number]    03-Jul-1945  Primary Care Physician:Eubanks, Champ Coma, NP Date of Appointment: 09/25/2023 Established Patient Visit  Chief complaint:   Chief Complaint  Patient presents with   Follow-up    Doing well.  Discuss a possible PFT     HPI: Albert Hayes is a 78 y.o. man with h/o AS s/p TAVR and severe COPD FEV1 41% of predicted.   Interval Updates: Here for follow up. Having some worsening dementia.   Had a covid infection about 3 months ago. Went to the hospital locally and was treated with paxlovid , never hospitalized.   On Trelegy 1 puff once daily.  Uses albuterol  prn. He's been using it twice daily in the morning as his previous pulmonolgist had advised. He wants to know if he should keep using this way.   Uses more frequently during allergies.  He has also been given a nebulizer machine and needs refills on albuterol .   He continues to walk regularly, stays active around the house.   I have reviewed the patient's family social and past medical history and updated as appropriate.   Past Medical History:  Diagnosis Date   Arteriosclerosis of coronary artery 12/22/2014   Asthma    Benign prostatic hyperplasia without lower urinary tract symptoms 09/13/2012   Cervical spondylosis 02/27/2013   Chronic idiopathic constipation 11/02/2015   Chronic obstructive pulmonary disease 12/22/2014   Coronary artery disease    DDD (degenerative disc disease), cervical 02/27/2013   DDD (degenerative disc disease), lumbosacral 02/27/2013   Dysphagia 10/18/2016   ED (erectile dysfunction)    Generalized anxiety disorder 01/21/2013   GERD (gastroesophageal reflux disease)    Heart murmur    EJECTION MURMUR   HLD (hyperlipidemia)    Hx of colon polyps    Hx of coronary artery bypass surgery 12/22/2014   Hypertension    Low back pain    Lumbosacral spondylosis 02/27/2013   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   Thrombocytopenia (HCC)    Vitamin D deficiency     Past Surgical History:  Procedure Laterality Date   ADENOIDECTOMY  2017   ANOMALOUS PULMONARY VENOUS RETURN REPAIR, TOTAL  2021   CAROTID STENT  2007   CAROTID STENT  2001   CATARACT EXTRACTION Bilateral 2017   COLONOSCOPY WITH PROPOFOL  N/A 10/31/2016   Procedure: COLONOSCOPY WITH PROPOFOL ;  Surgeon: Cassie Click, MD;  Location: [facility] ENDOSCOPY;  Service: Endoscopy;  Laterality: N/A;   CORONARY ANGIOPLASTY     CORONARY ARTERY BYPASS GRAFT  2006   triple   ESOPHAGOGASTRODUODENOSCOPY  2016   LUMBAR FUSION  2001   SEPTOPLASTY     TONSILLECTOMY      Family History  Problem Relation Age of Onset   Headache Mother    Cancer Father        Lung cancer   Parkinson's disease Paternal Grandmother        Unknown if paternal or maternal side of family   Alzheimer's disease Paternal Grandfather        Unknown if paternal or maternal side of family    Social History   Occupational History  Occupation: Retired Surveyor, minerals  Tobacco Use   Smoking status: Former    Current packs/day: 0.00    Average packs/day: 1 pack/day for 30.0 years (30.0 ttl pk-yrs)    Types: Cigarettes    Start date: 10/31/1958    Quit date: 10/30/1988    Years since quitting: 34.9   Smokeless tobacco: Never  Vaping Use   Vaping status: Never Used  Substance and Sexual Activity   Alcohol use: No    Alcohol/week: 0.0 standard drinks of alcohol   Drug use: No   Sexual activity: Not on file    Physical Exam: Blood pressure 116/65, pulse (!) 53, temperature 97.7 F (36.5 C), temperature source Oral, height 5' 7 (1.702 m), weight 157 lb (71.2 kg), SpO2 98%.  Gen:      No acute  distress ENT:  no nasal polyps, mucus membranes moist Lungs:    No increased respiratory effort, symmetric chest wall excursion, clear to auscultation bilaterally, no wheezes or crackles CV:         Regular rate and rhythm; no murmurs, rubs, or gallops.  No pedal edema   Data Reviewed: Imaging: I have personally reviewed the   PFTs:      No data to display         I have personally reviewed the patient's PFTs and   Labs: Lab Results  Component Value Date   NA 137 07/19/2023   K 4.5 07/19/2023   CO2 30 07/19/2023   GLUCOSE 93 07/19/2023   BUN 21 07/19/2023   CREATININE 0.97 07/19/2023   CALCIUM 9.1 07/19/2023   EGFR 88 01/18/2023   GFRNONAA >60 11/15/2017   Lab Results  Component Value Date   WBC 6.2 07/19/2023   HGB 14.9 07/19/2023   HCT 45.4 07/19/2023   MCV 89.0 07/19/2023   PLT 230 07/19/2023    Immunization status: Immunization History  Administered Date(s) Administered   Fluad Trivalent(High Dose 65+) 01/18/2023   Influenza, High Dose Seasonal PF 12/08/2014, 01/03/2016, 11/28/2016, 11/25/2018, 12/11/2019, 01/22/2022   Influenza,inj,quad, With Preservative 02/11/2018   Influenza-Unspecified 12/15/2013, 12/08/2014, 02/04/2015, 01/03/2016, 12/11/2019, 01/02/2021   PFIZER Comirnaty(Gray Top)Covid-19 Tri-Sucrose Vaccine 01/10/2020   PFIZER(Purple Top)SARS-COV-2 Vaccination 05/23/2019, 06/14/2019   PNEUMOCOCCAL CONJUGATE-20 01/22/2022   Pfizer(Comirnaty)Fall Seasonal Vaccine 12 years and older 03/13/2022   Pneumococcal Conjugate,unspecified 04/20/2014   Pneumococcal Conjugate-13 04/20/2012, 04/20/2014   Pneumococcal Polysaccharide-23 02/22/2020   Pneumococcal-Unspecified 01/07/2014   Rsv, Bivalent, Protein Subunit Rsvpref,pf Pattricia Bores) 01/22/2022   Tdap 04/09/2008, 04/20/2014   Zoster Recombinant(Shingrix) 08/09/2016, 12/20/2016   Zoster, Live 06/07/2013, 08/09/2016    External Records Personally Reviewed: vascular surgery  Assessment:  COPD Severe   History of tobacco use disorder History of smoking  Plan/Recommendations:   Glad your breathing is doing well.  Continue trelegy 1 puff once daily, gargle after use.   I have refilled your albuterol  inhaler and given you albuterol  nebulizer treatments. These are the same medication so you can take either.   Take the albuterol  rescue inhaler or nebulizer treatment every 4 to 6 hours as needed for wheezing or shortness of breath. You can also take it 15 minutes before exercise or exertional activity. Side effects include heart racing or pounding, jitters or anxiety. If you have a history of an irregular heart rhythm, it can make this worse. Can also give some patients a hard time sleeping.  Continue to stay active - this is the best thing for your breathing.   Call me sooner if any issues.  Return to Care: Return in about 1 year (around 09/24/2024).   Louie Rover, MD Pulmonary and Critical Care Medicine [facility] Office:[phone]

## 2023-09-25 NOTE — Progress Notes (Signed)
 follo

## 2023-09-25 NOTE — Patient Instructions (Addendum)
 It was a pleasure to see you today!  Please schedule follow up with myself in 1 year.  If my schedule is not open yet, we will contact you with a reminder closer to that time. Please call [phone] if you haven't heard from us  a month before, and always call us  sooner if issues or concerns arise. You can also send us  a message through MyChart, but but aware that this is not to be used for urgent issues and it may take up to 5-7 days to receive a reply. Please be aware that you will likely be able to view your results before I have a chance to respond to them. Please give us  5 business days to respond to any non-urgent results.    Glad your breathing is doing well.  Continue trelegy 1 puff once daily, gargle after use.   I have refilled your albuterol  inhaler and given you albuterol  nebulizer treatments. These are the same medication so you can take either.   Take the albuterol  rescue inhaler or nebulizer treatment every 4 to 6 hours as needed for wheezing or shortness of breath. You can also take it 15 minutes before exercise or exertional activity. Side effects include heart racing or pounding, jitters or anxiety. If you have a history of an irregular heart rhythm, it can make this worse. Can also give some patients a hard time sleeping.  Continue to stay active - this is the best thing for your breathing.   Call me sooner if any issues.

## 2023-10-02 ENCOUNTER — Other Ambulatory Visit: Payer: Self-pay | Admitting: Nurse Practitioner

## 2023-10-08 ENCOUNTER — Encounter: Payer: Self-pay | Admitting: Nurse Practitioner

## 2023-10-09 ENCOUNTER — Encounter: Payer: Self-pay | Admitting: Nurse Practitioner

## 2023-10-10 NOTE — Telephone Encounter (Signed)
Message routed to PCP Eubanks, Jessica K, NP  

## 2023-10-14 ENCOUNTER — Encounter: Payer: Self-pay | Admitting: Nurse Practitioner

## 2023-10-14 ENCOUNTER — Ambulatory Visit (INDEPENDENT_AMBULATORY_CARE_PROVIDER_SITE_OTHER): Admitting: Nurse Practitioner

## 2023-10-14 VITALS — BP 117/75 | HR 64 | Temp 98.2°F | Resp 18 | Ht 67.0 in | Wt 158.2 lb

## 2023-10-14 DIAGNOSIS — R5383 Other fatigue: Secondary | ICD-10-CM | POA: Diagnosis not present

## 2023-10-14 DIAGNOSIS — R351 Nocturia: Secondary | ICD-10-CM

## 2023-10-14 DIAGNOSIS — K5904 Chronic idiopathic constipation: Secondary | ICD-10-CM

## 2023-10-14 DIAGNOSIS — R300 Dysuria: Secondary | ICD-10-CM | POA: Insufficient documentation

## 2023-10-14 LAB — POCT URINALYSIS DIPSTICK (MANUAL)
Leukocytes, UA: NEGATIVE
Nitrite, UA: NEGATIVE
Poct Bilirubin: NEGATIVE
Poct Blood: NEGATIVE
Poct Glucose: NORMAL mg/dL
Poct Ketones: NEGATIVE
Poct Protein: NEGATIVE mg/dL
Poct Urobilinogen: NORMAL mg/dL
Spec Grav, UA: <=1.005 — AB (ref 1.010–1.025)
pH, UA: 7.5 (ref 5.0–8.0)

## 2023-10-14 NOTE — Progress Notes (Signed)
 Careteam: Patient Care Team: Caro Harlene POUR, NP as PCP - General (Geriatric Medicine) Jeffrie Oneil BROCKS, MD as PCP - Cardiology (Cardiology) Waylan Almarie SAUNDERS, MD as Attending Physician (Emergency Medicine) Skeet Juliene SAUNDERS, DO as Consulting Physician (Neurology) Jama, Cordella MATSU, MD (Vascular Surgery) Dina Camie FORBES DEVONNA (Neurology) [facility] [place]  PLACE OF SERVICE:  [facility]  Advanced Directive information    Allergies  Allergen Reactions   Penicillins Other (See Comments)    Unknown childhood allergic reaction - severe Has patient had a PCN reaction causing immediate rash, facial/tongue/throat swelling, SOB or lightheadedness with hypotension: Unknown Has patient had a PCN reaction causing severe rash involving mucus membranes or skin necrosis: Unknown Has patient had a PCN reaction that required hospitalization: Yes Has patient had a PCN reaction occurring within the last 10 years: No If all of the above answers are NO, then may proceed with Cephalosporin use.   Xanax [Alprazolam] Shortness Of Breath   Aricept  [Donepezil  Hcl]    Atorvastatin Other (See Comments)    Difficulty breathing    Bupropion Other (See Comments)    Unknown reaction   Citalopram Other (See Comments)    Unknown reaction   Cymbalta [Duloxetine Hcl] Other (See Comments)    Unknown reaction   Duloxetine Other (See Comments)   Fluoxetine Other (See Comments)    Unknown reaction   Fluvoxamine Other (See Comments)    Unknown reaction   Lexapro [Escitalopram Oxalate] Other (See Comments)    Unknown reaction   Niacin And Related Other (See Comments)    Unknown reaction   Oxcarbazepine Other (See Comments)    Unknown reaction   Pravastatin  Other (See Comments)    Unknown reaction   Quetiapine Other (See Comments)   Seroquel [Quetiapine Fumarate] Other (See Comments)    Unknown reaction   Statins Other (See Comments)    Unknown adverse reaction   Tamsulosin Other (See  Comments)    Unknown reaction   Zoloft [Sertraline Hcl] Other (See Comments)    Unknown reaction    Chief Complaint  Patient presents with   Fatigue    Fatigue, confusion, chills, constipation     HPI:  Discussed the use of AI scribe software for clinical note transcription with the patient, who gave verbal consent to proceed.  History of Present Illness Albert Hayes is a 78 year old male who presents with fatigue and episodes of chills.  He experiences episodes of fatigue that occur intermittently, distinct from general tiredness. These episodes are not present every day or all day long. He recalls a similar experience following a previous medical event, which lasted about six months before subsiding. Recently, these episodes have returned sporadically.  He also experiences chills, particularly when idle, such as sitting indoors at 70 degrees or outside on a patio at 80 degrees. During these episodes, he feels cold in his arms and begins to shiver, despite the ambient temperature. He manages these episodes by wearing additional layers of clothing. No fever or general malaise is reported.  He has noticed an increase in urination frequency at night, now occurring two to three times, whereas previously it was once a night. He recalls a similar issue in his early sixties, which was treated with medication that caused severe adverse reactions affecting his vision. He is cautious about medications that affect his nervous system due to past experiences with adverse reactions, including those related to Cymbalta.  No dark stools, signs of bleeding, or  bruising. He denies any burning sensation during urination, changes in urine odor, or discoloration. He experiences some dizziness when walking but stays well-hydrated.  He is currently on Plavix  and aspirin  as part of his medication regimen.  Review of Systems:  Review of Systems  Constitutional:  Negative for chills, fever and weight loss.   HENT:  Negative for tinnitus.   Respiratory:  Negative for cough, sputum production and shortness of breath.   Cardiovascular:  Negative for chest pain, palpitations and leg swelling.  Gastrointestinal:  Negative for abdominal pain, constipation, diarrhea and heartburn.  Genitourinary:  Positive for frequency. Negative for dysuria and urgency.  Musculoskeletal:  Negative for back pain, falls, joint pain and myalgias.  Skin: Negative.   Neurological:  Negative for dizziness and headaches.  Psychiatric/Behavioral:  Positive for memory loss. Negative for depression. The patient does not have insomnia.   ***  Past Medical History:  Diagnosis Date   Arteriosclerosis of coronary artery 12/22/2014   Asthma    Benign prostatic hyperplasia without lower urinary tract symptoms 09/13/2012   Cervical spondylosis 02/27/2013   Chronic idiopathic constipation 11/02/2015   Chronic obstructive pulmonary disease 12/22/2014   Coronary artery disease    DDD (degenerative disc disease), cervical 02/27/2013   DDD (degenerative disc disease), lumbosacral 02/27/2013   Dysphagia 10/18/2016   ED (erectile dysfunction)    Generalized anxiety disorder 01/21/2013   GERD (gastroesophageal reflux disease)    Heart murmur    EJECTION MURMUR   HLD (hyperlipidemia)    Hx of colon polyps    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   Thrombocytopenia (HCC)    Vitamin D deficiency    Past Surgical History:  Procedure Laterality Date   ADENOIDECTOMY  2017   ANOMALOUS PULMONARY VENOUS RETURN REPAIR, TOTAL   2021   CAROTID STENT  2007   CAROTID STENT  2001   CATARACT EXTRACTION Bilateral 2017   COLONOSCOPY WITH PROPOFOL  N/A 10/31/2016   Procedure: COLONOSCOPY WITH PROPOFOL ;  Surgeon: Viktoria Lamar DASEN, MD;  Location[facility];  Service: Endoscopy;  Laterality: N/A;   CORONARY ANGIOPLASTY     CORONARY ARTERY BYPASS GRAFT  2006   triple   ESOPHAGOGASTRODUODENOSCOPY  2016   LUMBAR FUSION  2001   SEPTOPLASTY     TONSILLECTOMY     Social History:   reports that he quit smoking about 34 years ago. His smoking use included cigarettes. He started smoking about 64 years ago. He has a 30 pack-year smoking history. He has never used smokeless tobacco. He reports that he does not drink alcohol and does not use drugs.  Family History  Problem Relation Age of Onset   Headache Mother    Cancer Father        Lung cancer   Parkinson's disease Paternal Grandmother        Unknown if paternal or maternal side of family   Alzheimer's disease Paternal Grandfather        Unknown if paternal or maternal side of family    Medications: Patient's Medications  New Prescriptions  No medications on file  Previous Medications   ALBUTEROL  (PROVENTIL ) (2.5 MG/3ML) 0.083% NEBULIZER SOLUTION    Take 3 mLs (2.5 mg total) by nebulization every 6 (six) hours as needed for wheezing or shortness of breath.   ALBUTEROL  (VENTOLIN  HFA) 108 (90 BASE) MCG/ACT INHALER    INHALE ONE TO TWO PUFFS BY MOUTH EVERY 6 HOURS AS NEEDED   AMLODIPINE  (NORVASC ) 10 MG TABLET    Take 1 tablet (10 mg total) by mouth every evening.   ASPIRIN  EC 81 MG TABLET    Take 81 mg by mouth daily.   AZELASTINE HCL (ASTEPRO) 0.15 % SOLN    Place 2 sprays into the nose at bedtime.   BUDESONIDE (PULMICORT) 1 MG/2ML NEBULIZER SOLUTION    Take 1 mg by nebulization as needed.   CARVEDILOL  (COREG ) 3.125 MG TABLET    Take 1 tablet (3.125 mg total) by mouth 2 (two) times daily.   CHOLECALCIFEROL (D3) 50 MCG (2000 UT) TABS    Take 1 tablet by mouth daily.    CLINDAMYCIN (CLEOCIN) 150 MG CAPSULE    Take 150 mg by mouth once. 2 tablets 2 hours prior to Dental Procedures   CLOPIDOGREL  (PLAVIX ) 75 MG TABLET    Take 1 tablet (75 mg total) by mouth daily.   CYANOCOBALAMIN (VITAMIN B12) 1000 MCG TABLET    Take 1,000 mcg by mouth daily.   DICLOFENAC  SODIUM (VOLTAREN ) 1 % GEL    Apply topically 4 (four) times daily.   EZETIMIBE  (ZETIA ) 10 MG TABLET    Take 1 tablet (10 mg total) by mouth daily.   FLUTICASONE-UMECLIDIN-VILANT (TRELEGY ELLIPTA ) 100-62.5-25 MCG/ACT AEPB    Inhale 1 puff into the lungs daily.   LISINOPRIL  (ZESTRIL ) 40 MG TABLET    Take 1 tablet (40 mg total) by mouth daily.   MEMANTINE  (NAMENDA ) 10 MG TABLET    Take 1 tablet (10 mg total) by mouth 2 (two) times daily.   OMEGA-3 FATTY ACIDS (FISH OIL PO)    Take 1 capsule by mouth 3 (three) times daily.   REPATHA  SURECLICK 140 MG/ML SOAJ    INJECT 140 MG INTO THE SKIN EVERY 14 DAYS.  Modified Medications   No medications on file  Discontinued Medications   No medications on file    Physical Exam:  Vitals:   10/14/23 1313  BP: 117/75  Pulse: 64  Resp: 18  Temp: 98.2 F (36.8 C)  SpO2: 97%  Weight: 158 lb 3.2 oz (71.8 kg)  Height: 5' 7 (1.702 m)   Body mass index is 24.78 kg/m. Wt Readings from Last 3 Encounters:  10/14/23 158 lb 3.2 oz (71.8 kg)  09/25/23 157 lb (71.2 kg)  08/26/23 156 lb (70.8 kg)    Physical Exam Constitutional:      General: He is not in acute distress.    Appearance: He is well-developed. He is not diaphoretic.  HENT:     Head: Normocephalic and atraumatic.     Right Ear: External ear normal.     Left Ear: External ear normal.     Mouth/Throat:     Pharynx: No oropharyngeal exudate.  Eyes:     Conjunctiva/sclera: Conjunctivae normal.     Pupils: Pupils are equal, round, and reactive to light.  Cardiovascular:     Rate and Rhythm: Normal rate and regular rhythm.     Heart sounds: Normal heart sounds.  Pulmonary:     Effort: Pulmonary effort  is normal.     Breath sounds:  Normal breath sounds.  Abdominal:     General: Bowel sounds are normal.     Palpations: Abdomen is soft.  Musculoskeletal:        General: No tenderness.     Cervical back: Normal range of motion and neck supple.     Right lower leg: No edema.     Left lower leg: No edema.  Skin:    General: Skin is warm and dry.  Neurological:     Mental Status: He is alert and oriented to person, place, and time.  Psychiatric:        Mood and Affect: Mood normal.     Labs reviewed: Basic Metabolic Panel: Recent Labs    01/18/23 0923 07/19/23 0928  NA 138 137  K 5.0 4.5  CL 102 102  CO2 29 30  GLUCOSE 96 93  BUN 20 21  CREATININE 0.90 0.97  CALCIUM 9.5 9.1   Liver Function Tests: Recent Labs    01/18/23 0923 07/19/23 0928  AST 14 14  ALT 12 14  BILITOT 0.6 0.6  PROT 7.0 6.6   No results for input(s): LIPASE, AMYLASE in the last 8760 hours. No results for input(s): AMMONIA in the last 8760 hours. CBC: Recent Labs    01/18/23 0923 07/19/23 0928  WBC 7.3 6.2  NEUTROABS 4,716 3,119  HGB 15.1 14.9  HCT 45.8 45.4  MCV 91.2 89.0  PLT 252 230   Lipid Panel: Recent Labs    07/19/23 0928  CHOL 88  HDL 44  LDLCALC 30  TRIG 60  CHOLHDL 2.0   TSH: No results for input(s): TSH in the last 8760 hours. A1C: Lab Results  Component Value Date   HGBA1C 6.2 (H) 07/19/2023     Assessment/Plan  Other fatigue  -Intermittent fatigue not linked to exertion or stress. Differential includes iron deficiency anemia and thyroid  dysfunction. Memory loss and difficulty focusing may indicate dementia.  - Order complete blood count and thyroid  function tests. -     CBC with Differential/Platelet -     TSH -     Comprehensive metabolic panel with GFR  Nocturia -     PSA today  Has had issues with nocturia in the past however was not able to tolerate medication.   UA WNL  Constipation Improved and Well controlled on current  regimen  Kishan Wachsmuth K. Caro BODILY [facility] [phone]

## 2023-10-15 ENCOUNTER — Ambulatory Visit: Payer: Self-pay | Admitting: Nurse Practitioner

## 2023-10-15 LAB — COMPREHENSIVE METABOLIC PANEL WITH GFR
AG Ratio: 1.8 (calc) (ref 1.0–2.5)
ALT: 14 U/L (ref 9–46)
AST: 16 U/L (ref 10–35)
Albumin: 4.8 g/dL (ref 3.6–5.1)
Alkaline phosphatase (APISO): 63 U/L (ref 35–144)
BUN: 19 mg/dL (ref 7–25)
CO2: 28 mmol/L (ref 20–32)
Calcium: 9.4 mg/dL (ref 8.6–10.3)
Chloride: 98 mmol/L (ref 98–110)
Creat: 0.85 mg/dL (ref 0.70–1.28)
Globulin: 2.6 g/dL (ref 1.9–3.7)
Glucose, Bld: 98 mg/dL (ref 65–99)
Potassium: 4.8 mmol/L (ref 3.5–5.3)
Sodium: 136 mmol/L (ref 135–146)
Total Bilirubin: 0.5 mg/dL (ref 0.2–1.2)
Total Protein: 7.4 g/dL (ref 6.1–8.1)
eGFR: 89 mL/min/1.73m2 (ref >=60)

## 2023-10-15 LAB — CBC WITH DIFFERENTIAL/PLATELET
Absolute Lymphocytes: 2122 {cells}/uL (ref 850–3900)
Absolute Monocytes: 702 {cells}/uL (ref 200–950)
Basophils Absolute: 78 {cells}/uL (ref 0–200)
Basophils Relative: 1 %
Eosinophils Absolute: 117 {cells}/uL (ref 15–500)
Eosinophils Relative: 1.5 %
HCT: 44.3 % (ref 38.5–50.0)
Hemoglobin: 14.5 g/dL (ref 13.2–17.1)
MCH: 30.1 pg (ref 27.0–33.0)
MCHC: 32.7 g/dL (ref 32.0–36.0)
MCV: 91.9 fL (ref 80.0–100.0)
MPV: 9.2 fL (ref 7.5–12.5)
Monocytes Relative: 9 %
Neutro Abs: 4781 {cells}/uL (ref 1500–7800)
Neutrophils Relative %: 61.3 %
Platelets: 221 Thousand/uL (ref 140–400)
RBC: 4.82 Million/uL (ref 4.20–5.80)
RDW: 13 % (ref 11.0–15.0)
Total Lymphocyte: 27.2 %
WBC: 7.8 Thousand/uL (ref 3.8–10.8)

## 2023-10-15 LAB — TSH: TSH: 5.58 m[IU]/L — ABNORMAL HIGH (ref 0.40–4.50)

## 2023-10-15 LAB — PSA: PSA: 0.92 ng/mL (ref <=4.00)

## 2023-10-17 ENCOUNTER — Encounter: Payer: Self-pay | Admitting: Nurse Practitioner

## 2023-11-15 ENCOUNTER — Other Ambulatory Visit: Payer: Self-pay | Admitting: Cardiology

## 2024-01-14 ENCOUNTER — Other Ambulatory Visit: Payer: Self-pay

## 2024-01-15 MED ORDER — LISINOPRIL 40 MG PO TABS: 40.0000 mg | ORAL_TABLET | Freq: Every day | ORAL | 0 refills | Status: DC

## 2024-01-15 MED ORDER — AMLODIPINE BESYLATE 10 MG PO TABS: 10.0000 mg | ORAL_TABLET | Freq: Every evening | ORAL | 0 refills | Status: DC

## 2024-01-20 NOTE — Progress Notes (Unsigned)
 Cardiology Office Note:    Date:  01/21/2024   ID:  Albert Hayes, DOB August 02, 1945, MRN [id number]  PCP:  Caro Harlene POUR, NP   [facility] Providers Cardiologist:  Oneil Parchment, MD     Referring MD: Caro Harlene POUR, NP   Chief complaint: Annual follow-up for CAD  History of Present Illness:    Albert Hayes is a 78 y.o. male with a hx of CAD s/p CABG, severe aortic stenosis s/p TAVR, COPD, GERD, HTN, type 2 DM, HLD, carotid artery disease s/p endarterectomy, stroke, former tobacco use, statin intolerance, and thrombocytopenia.  Remote surgical history: Angioplasty / stenting femoral (2005, 2006); Coronary artery bypass graft (2005); Septoplasty; Laminotomy Posterior Cervicle/Hemilaminectomy W/Nerve Root Decomp; Coronary Artery Bypass W/Arterial Grafts; EGD (2016).   Coronary artery disease of bypass graft of native heart with stable angina pectoris status post PCI of the vein graft to the OM 2. Previously patent LIMA to the LAD and vein graft to the RPDA.  Previously followed by Dr. Bosie at the [facility] in [place], who noted carotid Dopplers with less than 50% stenosis bilaterally. Carotid endarterectomy in 2019. CVA in 2020.   Aortic valve disease led to TAVR with a Medtronic CoreValve placed in June 2021. Was doing well post procedure. Gradually improving his activity.  Transferred to our cardiology services 01/05/2022, was evaluated by Dr. 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 Lavina notes. Did not appear to have flow restriction likely secondary to the carotid approach to his TAVR. Does not appear to require surgical intervention at present.  Last evaluated by Dr. Parchment 12/2022, was doing well at that time. Echo was ordered to re-evaluate TAVR valve. Echo 01/23/2023 LVEF 60-65%, no RWMA, normal diastolic parameters, normal mitral valve. 29 mm Medtronic TAVR valve present in aortic position, normal structure and  function of prosthesis.   Carotid disease followed by vascular. Carotid duplex 08/26/2023 showed velocities in bilateral ICAs consistent w/ 1-39% stenosis.  Patient presents to the clinic alone today, is doing well from a cardiovascular standpoint.  Only symptom he reports is occasional, rare dizziness on standing that passes 5 seconds after positional adjustment.  Reports a stable, states does not happen often. He denies chest pain, palpitations, dyspnea, orthopnea, n, v,  dark/tarry/bloody stools, hematuria, syncope, edema, weight gain.  Reports compliance with all medications, tries to walk regularly.  Monitors his BP a few times a week, ranges from 100s-120s daily systolic, he's unsure of the bottom number.   Past Medical History:  Diagnosis Date   Arteriosclerosis of coronary artery 12/22/2014   Asthma    Benign prostatic hyperplasia without lower urinary tract symptoms 09/13/2012   Cervical spondylosis 02/27/2013   Chronic idiopathic constipation 11/02/2015   Chronic obstructive pulmonary disease 12/22/2014   Coronary artery disease    DDD (degenerative disc disease), cervical 02/27/2013   DDD (degenerative disc disease), lumbosacral 02/27/2013   Dysphagia 10/18/2016   ED (erectile dysfunction)    Generalized anxiety disorder 01/21/2013   GERD (gastroesophageal reflux disease)    Heart murmur    EJECTION MURMUR   HLD (hyperlipidemia)    Hx of colon polyps    Hx of coronary artery bypass surgery 12/22/2014   Hypertension    Low back pain    Lumbosacral spondylosis 02/27/2013   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   Thrombocytopenia    Vitamin D deficiency     Past Surgical History:   Procedure Laterality Date   ADENOIDECTOMY  2017   ANOMALOUS PULMONARY VENOUS RETURN REPAIR, TOTAL  2021   CAROTID STENT  2007   CAROTID STENT  2001   CATARACT EXTRACTION Bilateral 2017   COLONOSCOPY WITH PROPOFOL  N/A 10/31/2016   Procedure: COLONOSCOPY WITH PROPOFOL ;  Surgeon: Viktoria Lamar DASEN, MD;  Location: [facility] ENDOSCOPY;  Service: Endoscopy;  Laterality: N/A;   CORONARY ANGIOPLASTY     CORONARY ARTERY BYPASS GRAFT  2006   triple   ESOPHAGOGASTRODUODENOSCOPY  2016   LUMBAR FUSION  2001   SEPTOPLASTY     TONSILLECTOMY      Current Medications: Current Meds  Medication Sig   albuterol  (PROVENTIL ) (2.5 MG/3ML) 0.083% nebulizer solution Take 3 mLs (2.5 mg total) by nebulization every 6 (six) hours as needed for wheezing or shortness of breath.   albuterol  (VENTOLIN  HFA) 108 (90 Base) MCG/ACT inhaler INHALE ONE TO TWO PUFFS BY MOUTH EVERY 6 HOURS AS NEEDED   aspirin  EC 81 MG tablet Take 81 mg by mouth daily.   Azelastine HCl (ASTEPRO) 0.15 % SOLN Place 2 sprays into the nose at bedtime.   budesonide (PULMICORT) 1 MG/2ML nebulizer solution Take 1 mg by nebulization as needed.   carvedilol  (COREG ) 3.125 MG tablet Take 1 tablet (3.125 mg total) by mouth 2 (two) times daily.   Cholecalciferol (D3) 50 MCG (2000 UT) TABS Take 1 tablet by mouth daily.   clindamycin (CLEOCIN) 150 MG capsule Take 150 mg by mouth once. 2 tablets 2 hours prior to Dental Procedures   clopidogrel  (PLAVIX ) 75 MG tablet Take 1 tablet (75 mg total) by mouth daily.   cyanocobalamin (VITAMIN B12) 1000 MCG tablet Take 1,000 mcg by mouth daily.   diclofenac  Sodium (VOLTAREN ) 1 % GEL Apply topically 4 (four) times daily.   ezetimibe  (ZETIA ) 10 MG tablet Take 1 tablet (10 mg total) by mouth daily.   Fluticasone-Umeclidin-Vilant (TRELEGY ELLIPTA ) 100-62.5-25 MCG/ACT AEPB Inhale 1 puff into the lungs daily.   lisinopril  (ZESTRIL ) 40 MG tablet Take 1 tablet (40 mg total) by mouth daily.   memantine  (NAMENDA ) 10 MG tablet  Take 1 tablet (10 mg total) by mouth 2 (two) times daily.   Omega-3 Fatty Acids (FISH OIL PO) Take 1 capsule by mouth 3 (three) times daily.   REPATHA  SURECLICK 140 MG/ML SOAJ INJECT 140 MG INTO THE SKIN EVERY 14 DAYS.   [DISCONTINUED] amLODipine  (NORVASC ) 10 MG tablet Take 1 tablet (10 mg total) by mouth every evening.     Allergies:   Penicillins, Xanax [alprazolam], Aricept  [donepezil  hcl], Atorvastatin, Bupropion, Citalopram, Cymbalta [duloxetine hcl], Duloxetine, Fluoxetine, Fluvoxamine, Lexapro [escitalopram oxalate], Niacin and related, Oxcarbazepine, Pravastatin , Quetiapine, Seroquel [quetiapine fumarate], Statins, Tamsulosin, and Zoloft [sertraline hcl]   Social History   Socioeconomic History   Marital status: Married    Spouse name: Rock   Number of children: 3   Years of education: 16   Highest education level: Associate degree: occupational, Scientist, product/process development, or vocational program  Occupational History   Occupation: Retired Surveyor, minerals  Tobacco Use   Smoking status: Former    Current packs/day: 0.00    Average packs/day: 1 pack/day for 30.0 years (30.0 ttl pk-yrs)    Types: Cigarettes    Start date: 10/31/1958  Quit date: 10/30/1988    Years since quitting: 35.2   Smokeless tobacco: Never  Vaping Use   Vaping status: Never Used  Substance and Sexual Activity   Alcohol use: No    Alcohol/week: 0.0 standard drinks of alcohol   Drug use: No   Sexual activity: Not on file  Other Topics Concern   Not on file  Social History Narrative   Married. Education: high school/other. Exercise: Yes.      Patient is right-handed.   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     Social Drivers of Corporate investment banker Strain: Low Risk  (10/10/2023)   Overall Financial Resource Strain (CARDIA)    Difficulty of Paying Living Expenses: Not hard at all  Food Insecurity: No Food  Insecurity (10/10/2023)   Hunger Vital Sign    Worried About Running Out of Food in the Last Year: Never true    Ran Out of Food in the Last Year: Never true  Transportation Needs: No Transportation Needs (10/10/2023)   PRAPARE - Administrator, Civil Service (Medical): No    Lack of Transportation (Non-Medical): No  Physical Activity: Sufficiently Active (10/10/2023)   Exercise Vital Sign    Days of Exercise per Week: 5 days    Minutes of Exercise per Session: 120 min  Stress: No Stress Concern Present (10/10/2023)   [employer] of Occupational Health - Occupational Stress Questionnaire    Feeling of Stress: Only a little  Social Connections: Moderately Integrated (10/10/2023)   Social Connection and Isolation Panel    Frequency of Communication with Friends and Family: Once a week    Frequency of Social Gatherings with Friends and Family: Once a week    Attends Religious Services: More than 4 times per year    Active Member of [employer] or Organizations: Yes    Attends Engineer, structural: More than 4 times per year    Marital Status: Married     Family History: The patient's family history includes Alzheimer's disease in his paternal grandfather; Cancer in his father; Headache in his mother; Parkinson's disease in his paternal grandmother.  ROS:   Please see the history of present illness.    All other systems reviewed and are negative.  EKGs/Labs/Other Studies Reviewed:    The following studies were reviewed today:  EKG Interpretation Date/Time:  Tuesday January 21 2024 11:00:36 EDT Ventricular Rate:  64 PR Interval:  154 QRS Duration:  98 QT Interval:  406 QTC Calculation: 418 R Axis:   104  Text Interpretation: Normal sinus rhythm Rightward axis Incomplete right bundle branch block  No significant change since prior studies Confirmed by Jennette Leask [phone]) on 01/21/2024 11:31:11 AM    Recent Labs: 10/14/2023: ALT 14; BUN 19; Creat 0.85; Hemoglobin  14.5; Platelets 221; Potassium 4.8; Sodium 136; TSH 5.58  Recent Lipid Panel    Component Value Date/Time   CHOL 88 07/19/2023 0928   TRIG 60 07/19/2023 0928   HDL 44 07/19/2023 0928   CHOLHDL 2.0 07/19/2023 0928   VLDL 26 11/16/2017 0517   LDLCALC 30 07/19/2023 0928      Physical Exam:    VS:  BP (!) 112/54   Pulse 63   Ht 5' 7 (1.702 m)   Wt 158 lb 12.8 oz (72 kg)   SpO2 98%  BMI 24.87 kg/m     Wt Readings from Last 3 Encounters:  01/21/24 158 lb 12.8 oz (72 kg)  10/14/23 158 lb 3.2 oz (71.8 kg)  09/25/23 157 lb (71.2 kg)     GEN:  Well nourished, well developed in no acute distress HEENT: Normal NECK:  No carotid bruits CARDIAC: RRR, no murmurs, rubs, gallops RESPIRATORY:  Clear to auscultation without rales, wheezing or rhonchi  MUSCULOSKELETAL:  No edema; No deformity  SKIN: Warm and dry NEUROLOGIC:  Alert and oriented x 3 PSYCHIATRIC:  Normal affect   ASSESSMENT:    1. Coronary artery disease involving native coronary artery of native heart without angina pectoris   2. Hx of CABG   3. S/P TAVR (transcatheter aortic valve replacement)   4. Primary hypertension   5. Hyperlipidemia, unspecified hyperlipidemia type   6. Bilateral carotid artery stenosis    PLAN:    In order of problems listed above:  CAD s/p CABG x 3 EKG: NSR, 64 bpm, Rightward axis, incomplete RBBB, no significant change from prior studies Denies CP, SOB, palpitations, orthopnea, near syncope, leg swelling Denies missed doses of his DAPT Denies excessive bleeding/bruising/rash while on DAPT Reduce amlodipine  to 5 mg daily Continue aspirin  EC 81 mg daily Continue carvedilol  3.125 mg twice daily Continue Plavix  75 mg daily Continue lisinopril  40 mg daily 10/14/2023: BUN 19, creatinine 0.85, eGFR 89, PLT 221, Hgb 14.5 Follows up with PCP for physical w/ blood work on 01/27/2024  Severe aortic stenosis s/p TAVR Echo 01/23/2023: LVEF 60-65%, no RWMA, 29 mm Medtronic TAVR appeared  normal instruction and function. Denies SOB, leg swelling, orthopnea, palpitations Will order repeat echo to evaluate status of TAVR   HTN BPs averaging 100s-120s systolic, unsure of diastolic Reports occasional dizziness on standing BPs low normal in clinic today Reduce amlodipine  to 5 mg daily to see if symptoms of dizziness w/ positional changes improve Continue carvedilol  3.125 mg twice daily Continue lisinopril  40 mg daily Keep BP log 2-3 times per week and bring back to next appointment in 3 months Call the clinic and report BPs consistently >140/90  HLD LDL goal <55 Lipid panel 07/19/2023: Cholesterol 88, HDL 44, LDL 30, triglycerides 60 Continue Repatha  140 mg [place] inj every 2 weeks Continue Zetia  10 mg daily Patient not fasting today, states his PCP checks his labs next week  Carotid artery stenosis Carotid duplex 08/26/2023 showed velocities in bilateral ICAs consistent w/ 1-39% stenosis. Followed by [facility]   Follow up in 3 months to re-evaluate BPs/dizziness and discuss echo results.     Medication Adjustments/Labs and Tests Ordered: Current medicines are reviewed at length with the patient today.  Concerns regarding medicines are outlined above.  Orders Placed This Encounter  Procedures   EKG 12-Lead   ECHOCARDIOGRAM COMPLETE   Meds ordered this encounter  Medications   amLODipine  (NORVASC ) 5 MG tablet    Sig: Take 1 tablet (5 mg total) by mouth every evening.    Dispense:  90 tablet    Refill:  1    Decrease dose    Patient Instructions  Medication Instructions:  DECREASE Norvasc  (Amlodipine ) to 5mg  Take 1 tablet once a day *If you need a refill on your cardiac medications before your next appointment, please call your pharmacy*  Lab Work: None ordered If you have labs (blood work) drawn today and your tests are completely normal, you will receive your results only by: MyChart Message (if you have MyChart) OR A paper copy in the  mail If you have any lab  test that is abnormal or we need to change your treatment, we will call you to review the results.  Testing/Procedures: Your physician has requested that you have an echocardiogram. Echocardiography is a painless test that uses sound waves to create images of your heart. It provides your doctor with information about the size and shape of your heart and how well your heart's chambers and valves are working. This procedure takes approximately one hour. There are no restrictions for this procedure. Please do NOT wear cologne, perfume, aftershave, or lotions (deodorant is allowed). Please arrive 15 minutes prior to your appointment time.  Please note: We ask at that you not bring children with you during ultrasound (echo/ vascular) testing. Due to room size and safety concerns, children are not allowed in the ultrasound rooms during exams. Our front office staff cannot provide observation of children in our lobby area while testing is being conducted. An adult accompanying a patient to their appointment will only be allowed in the ultrasound room at the discretion of the ultrasound technician under special circumstances. We apologize for any inconvenience.  Follow-Up: At [facility], you and your health needs are our priority.  As part of our continuing mission to provide you with exceptional heart care, our providers are all part of one team.  This team includes your primary Cardiologist (physician) and Advanced Practice Providers or APPs (Physician Assistants and Nurse Practitioners) who all work together to provide you with the care you need, when you need it.  Your next appointment:   3 month(s)  Provider:   Oneil Parchment, MD or APP   We recommend signing up for the patient portal called MyChart.  Sign up information is provided on this After Visit Summary.  MyChart is used to connect with patients for Virtual Visits (Telemedicine).  Patients are able to view lab/test results, encounter  notes, upcoming appointments, etc.  Non-urgent messages can be sent to your provider as well.   To learn more about what you can do with MyChart, go to [email or web address].   Other Instructions            Signed, Miriam FORBES Shams, NP  01/21/2024 12:19 PM    [facility]

## 2024-01-21 ENCOUNTER — Encounter: Payer: Self-pay | Admitting: General Practice

## 2024-01-21 ENCOUNTER — Ambulatory Visit: Attending: General Practice | Admitting: Emergency Medicine

## 2024-01-21 VITALS — BP 112/54 | HR 63 | Ht 67.0 in | Wt 158.8 lb

## 2024-01-21 DIAGNOSIS — Z952 Presence of prosthetic heart valve: Secondary | ICD-10-CM

## 2024-01-21 DIAGNOSIS — E785 Hyperlipidemia, unspecified: Secondary | ICD-10-CM | POA: Diagnosis not present

## 2024-01-21 DIAGNOSIS — I10 Essential (primary) hypertension: Secondary | ICD-10-CM | POA: Diagnosis not present

## 2024-01-21 DIAGNOSIS — I6523 Occlusion and stenosis of bilateral carotid arteries: Secondary | ICD-10-CM | POA: Diagnosis not present

## 2024-01-21 DIAGNOSIS — Z951 Presence of aortocoronary bypass graft: Secondary | ICD-10-CM | POA: Diagnosis not present

## 2024-01-21 DIAGNOSIS — I2510 Atherosclerotic heart disease of native coronary artery without angina pectoris: Secondary | ICD-10-CM | POA: Diagnosis not present

## 2024-01-21 MED ORDER — AMLODIPINE BESYLATE 5 MG PO TABS: 5.0000 mg | ORAL_TABLET | Freq: Every evening | ORAL | 1 refills | Status: DC

## 2024-01-21 NOTE — Patient Instructions (Signed)
 Medication Instructions:  DECREASE Norvasc  (Amlodipine ) to 5mg  Take 1 tablet once a day *If you need a refill on your cardiac medications before your next appointment, please call your pharmacy*  Lab Work: None ordered If you have labs (blood work) drawn today and your tests are completely normal, you will receive your results only by: MyChart Message (if you have MyChart) OR A paper copy in the mail If you have any lab test that is abnormal or we need to change your treatment, we will call you to review the results.  Testing/Procedures: Your physician has requested that you have an echocardiogram. Echocardiography is a painless test that uses sound waves to create images of your heart. It provides your doctor with information about the size and shape of your heart and how well your heart's chambers and valves are working. This procedure takes approximately one hour. There are no restrictions for this procedure. Please do NOT wear cologne, perfume, aftershave, or lotions (deodorant is allowed). Please arrive 15 minutes prior to your appointment time.  Please note: We ask at that you not bring children with you during ultrasound (echo/ vascular) testing. Due to room size and safety concerns, children are not allowed in the ultrasound rooms during exams. Our front office staff cannot provide observation of children in our lobby area while testing is being conducted. An adult accompanying a patient to their appointment will only be allowed in the ultrasound room at the discretion of the ultrasound technician under special circumstances. We apologize for any inconvenience.  Follow-Up: At [facility], you and your health needs are our priority.  As part of our continuing mission to provide you with exceptional heart care, our providers are all part of one team.  This team includes your primary Cardiologist (physician) and Advanced Practice Providers or APPs (Physician Assistants and Nurse  Practitioners) who all work together to provide you with the care you need, when you need it.  Your next appointment:   3 month(s)  Provider:   Oneil Parchment, MD or APP   We recommend signing up for the patient portal called MyChart.  Sign up information is provided on this After Visit Summary.  MyChart is used to connect with patients for Virtual Visits (Telemedicine).  Patients are able to view lab/test results, encounter notes, upcoming appointments, etc.  Non-urgent messages can be sent to your provider as well.   To learn more about what you can do with MyChart, go to [email or web address].   Other Instructions

## 2024-01-27 ENCOUNTER — Ambulatory Visit (INDEPENDENT_AMBULATORY_CARE_PROVIDER_SITE_OTHER): Admitting: Nurse Practitioner

## 2024-01-27 ENCOUNTER — Encounter: Payer: Self-pay | Admitting: Nurse Practitioner

## 2024-01-27 VITALS — BP 118/72 | HR 56 | Temp 97.1°F | Ht 67.0 in | Wt 154.0 lb

## 2024-01-27 DIAGNOSIS — F01A0 Vascular dementia, mild, without behavioral disturbance, psychotic disturbance, mood disturbance, and anxiety: Secondary | ICD-10-CM

## 2024-01-27 DIAGNOSIS — F339 Major depressive disorder, recurrent, unspecified: Secondary | ICD-10-CM

## 2024-01-27 DIAGNOSIS — E785 Hyperlipidemia, unspecified: Secondary | ICD-10-CM | POA: Diagnosis not present

## 2024-01-27 DIAGNOSIS — I10 Essential (primary) hypertension: Secondary | ICD-10-CM

## 2024-01-27 DIAGNOSIS — J449 Chronic obstructive pulmonary disease, unspecified: Secondary | ICD-10-CM

## 2024-01-27 DIAGNOSIS — Z23 Encounter for immunization: Secondary | ICD-10-CM | POA: Diagnosis not present

## 2024-01-27 DIAGNOSIS — R7309 Other abnormal glucose: Secondary | ICD-10-CM | POA: Diagnosis not present

## 2024-01-27 NOTE — Progress Notes (Signed)
 Careteam: Patient Care Team: Caro Harlene POUR, NP as PCP - General (Geriatric Medicine) Jeffrie Oneil BROCKS, MD as PCP - Cardiology (Cardiology) Waylan Almarie SAUNDERS, MD as Attending Physician (Emergency Medicine) Skeet Juliene SAUNDERS, DO as Consulting Physician (Neurology) Jama, Cordella MATSU, MD (Vascular Surgery) Dina Camie FORBES DEVONNA (Neurology) [facility], [place]  PLACE OF SERVICE:  [facility]  Advanced Directive information    Allergies  Allergen Reactions   Penicillins Other (See Comments)    Unknown childhood allergic reaction - severe Has patient had a PCN reaction causing immediate rash, facial/tongue/throat swelling, SOB or lightheadedness with hypotension: Unknown Has patient had a PCN reaction causing severe rash involving mucus membranes or skin necrosis: Unknown Has patient had a PCN reaction that required hospitalization: Yes Has patient had a PCN reaction occurring within the last 10 years: No If all of the above answers are NO, then may proceed with Cephalosporin use.   Xanax [Alprazolam] Shortness Of Breath   Aricept  [Donepezil  Hcl]    Atorvastatin Other (See Comments)    Difficulty breathing    Bupropion Other (See Comments)    Unknown reaction   Citalopram Other (See Comments)    Unknown reaction   Cymbalta [Duloxetine Hcl] Other (See Comments)    Unknown reaction   Duloxetine Other (See Comments)   Fluoxetine Other (See Comments)    Unknown reaction   Fluvoxamine Other (See Comments)    Unknown reaction   Lexapro [Escitalopram Oxalate] Other (See Comments)    Unknown reaction   Niacin And Related Other (See Comments)    Unknown reaction   Oxcarbazepine Other (See Comments)    Unknown reaction   Pravastatin  Other (See Comments)    Unknown reaction   Quetiapine Other (See Comments)   Seroquel [Quetiapine Fumarate] Other (See Comments)    Unknown reaction   Statins Other (See Comments)    Unknown adverse reaction   Tamsulosin Other (See  Comments)    Unknown reaction   Zoloft [Sertraline Hcl] Other (See Comments)    Unknown reaction    Chief Complaint  Patient presents with   Medical Management of Chronic Issues    6 month follow-up. Discussed need for flu vaccine (today)and covid booster (would like to ask provider if he should still receive)     HPI:  Discussed the use of AI scribe software for clinical note transcription with the patient, who gave verbal consent to proceed.  History of Present Illness Albert Hayes is a 78 year old male with hypertension and coronary artery disease who presents for a six-month follow-up.  During his last visit in July, he experienced fatigue and chills. Since then, he has seen his cardiologist for his annual check-up, where concerns about his blood pressure were raised. He experienced dizziness and fatigue, particularly when standing up, which led to a reduction in his amlodipine  dosage from 10 mg to 5 mg. The dizziness has improved since the medication adjustment, and he has not experienced dizziness after the second day of the change. He continues to monitor his blood pressure at home, noting gradual improvement.  He is currently taking lisinopril  40 mg daily, carvedilol  3.125 mg twice daily, and amlodipine  5 mg daily for blood pressure management.    He takes Repatha  140 mg injection every two weeks and Zetia  for cholesterol management. His last cholesterol check in April showed his LDL at goal, with a value of 30. He plans to have his cholesterol rechecked today as he is  fasting. He continues to take aspirin  and Plavix  as part of his medication regimen.   Due to his dementia, he has developed a system to ensure he takes all his medications, using a tray on his counter and assistance from his wife. He is taking namenda  for dementia and tolerating well.   For COPD management, he uses a budesonide nebulizer only during severe breathing attacks, which have not occurred recently. He  also takes Trelegy one puff daily. He is concerned about losing his pulmonologist, who is relocating due to family issues.  He reports eating two good meals a day and ensures adequate protein intake. No changes in bowel movements, urination frequency, or joint pain. He typically gets up twice at night to urinate.    Review of Systems:  Review of Systems  Constitutional:  Negative for chills, fever and weight loss.  HENT:  Negative for tinnitus.   Respiratory:  Negative for cough, sputum production and shortness of breath.   Cardiovascular:  Negative for chest pain, palpitations and leg swelling.  Gastrointestinal:  Negative for abdominal pain, constipation, diarrhea and heartburn.  Genitourinary:  Negative for dysuria, frequency and urgency.  Musculoskeletal:  Negative for back pain, falls, joint pain and myalgias.  Skin: Negative.   Neurological:  Negative for dizziness and headaches.  Psychiatric/Behavioral:  Positive for memory loss. Negative for depression. The patient does not have insomnia.     Past Medical History:  Diagnosis Date   Arteriosclerosis of coronary artery 12/22/2014   Asthma    Benign prostatic hyperplasia without lower urinary tract symptoms 09/13/2012   Cervical spondylosis 02/27/2013   Chronic idiopathic constipation 11/02/2015   Chronic obstructive pulmonary disease 12/22/2014   Coronary artery disease    DDD (degenerative disc disease), cervical 02/27/2013   DDD (degenerative disc disease), lumbosacral 02/27/2013   Dysphagia 10/18/2016   ED (erectile dysfunction)    Generalized anxiety disorder 01/21/2013   GERD (gastroesophageal reflux disease)    Heart murmur    EJECTION MURMUR   HLD (hyperlipidemia)    Hx of colon polyps    Hx of coronary artery bypass surgery 12/22/2014   Hypertension    Low back pain    Lumbosacral spondylosis 02/27/2013   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 ENDOSCOPY;  Service: Endoscopy;  Laterality: N/A;   CORONARY ANGIOPLASTY     CORONARY ARTERY BYPASS GRAFT  2006   triple   ESOPHAGOGASTRODUODENOSCOPY  2016   LUMBAR FUSION  2001   SEPTOPLASTY     TONSILLECTOMY     Social History:   reports that he quit smoking about 35 years ago. His smoking use included cigarettes. He started smoking about 65 years ago. He has a 30 pack-year smoking history. He has never used smokeless tobacco. He reports that he does  not drink alcohol and does not use drugs.  Family History  Problem Relation Age of Onset   Headache Mother    Cancer Father        Lung cancer   Parkinson's disease Paternal Grandmother        Unknown if paternal or maternal side of family   Alzheimer's disease Paternal Grandfather        Unknown if paternal or maternal side of family    Medications: Patient's Medications  New Prescriptions   No medications on file  Previous Medications   ALBUTEROL  (PROVENTIL ) (2.5 MG/3ML) 0.083% NEBULIZER SOLUTION    Take 3 mLs (2.5 mg total) by nebulization every 6 (six) hours as needed for wheezing or shortness of breath.   ALBUTEROL  (VENTOLIN  HFA) 108 (90 BASE) MCG/ACT INHALER    INHALE ONE TO TWO PUFFS BY MOUTH EVERY 6 HOURS AS NEEDED   AMLODIPINE  (NORVASC ) 5 MG TABLET    Take 1 tablet (5 mg total) by mouth every evening.   ASPIRIN  EC 81  MG TABLET    Take 81 mg by mouth daily.   AZELASTINE HCL (ASTEPRO) 0.15 % SOLN    Place 2 sprays into the nose at bedtime.   BUDESONIDE (PULMICORT) 1 MG/2ML NEBULIZER SOLUTION    Take 1 mg by nebulization as needed.   CARVEDILOL  (COREG ) 3.125 MG TABLET    Take 1 tablet (3.125 mg total) by mouth 2 (two) times daily.   CHOLECALCIFEROL (D3) 50 MCG (2000 UT) TABS    Take 1 tablet by mouth daily.   CLINDAMYCIN (CLEOCIN) 150 MG CAPSULE    Take 150 mg by mouth once. 2 tablets 2 hours prior to Dental Procedures   CLOPIDOGREL  (PLAVIX ) 75 MG TABLET    Take 1 tablet (75 mg total) by mouth daily.   CYANOCOBALAMIN (VITAMIN B12) 1000 MCG TABLET    Take 1,000 mcg by mouth daily.   DICLOFENAC  SODIUM (VOLTAREN ) 1 % GEL    Apply topically 4 (four) times daily.   EZETIMIBE  (ZETIA ) 10 MG TABLET    Take 1 tablet (10 mg total) by mouth daily.   FLUTICASONE-UMECLIDIN-VILANT (TRELEGY ELLIPTA ) 100-62.5-25 MCG/ACT AEPB    Inhale 1 puff into the lungs daily.   LISINOPRIL  (ZESTRIL ) 40 MG TABLET    Take 1 tablet (40 mg total) by mouth daily.   MEMANTINE  (NAMENDA ) 10 MG TABLET    Take 1 tablet (10 mg total) by mouth 2 (two) times daily.   OMEGA-3 FATTY ACIDS (FISH OIL PO)    Take 1 capsule by mouth 3 (three) times daily.   REPATHA  SURECLICK 140 MG/ML SOAJ    INJECT 140 MG INTO THE SKIN EVERY 14 DAYS.  Modified Medications   No medications on file  Discontinued Medications   No medications on file    Physical Exam:  Vitals:   01/27/24 0934  BP: 118/72  Pulse: (!) 56  Temp: (!) 97.1 F (36.2 C)  SpO2: 98%  Weight: 154 lb (69.9 kg)  Height: 5' 7 (1.702 m)   Body mass index is 24.12 kg/m. Wt Readings from Last 3 Encounters:  01/27/24 154 lb (69.9 kg)  01/21/24 158 lb 12.8 oz (72 kg)  10/14/23 158 lb 3.2 oz (71.8 kg)    Physical Exam Constitutional:      General: He is not in acute distress.    Appearance: He is well-developed. He is not diaphoretic.  HENT:     Head: Normocephalic and atraumatic.      Right Ear:  External ear normal.     Left Ear: External ear normal.     Mouth/Throat:     Pharynx: No oropharyngeal exudate.  Eyes:     Conjunctiva/sclera: Conjunctivae normal.     Pupils: Pupils are equal, round, and reactive to light.  Cardiovascular:     Rate and Rhythm: Normal rate and regular rhythm.     Heart sounds: Normal heart sounds.  Pulmonary:     Effort: Pulmonary effort is normal.     Breath sounds: Normal breath sounds.  Abdominal:     General: Bowel sounds are normal.     Palpations: Abdomen is soft.  Musculoskeletal:        General: No tenderness.     Cervical back: Normal range of motion and neck supple.     Right lower leg: No edema.     Left lower leg: No edema.  Skin:    General: Skin is warm and dry.  Neurological:     Mental Status: He is alert and oriented to person, place, and time.     Labs reviewed: Basic Metabolic Panel: Recent Labs    07/19/23 0928 10/14/23 1343  NA 137 136  K 4.5 4.8  CL 102 98  CO2 30 28  GLUCOSE 93 98  BUN 21 19  CREATININE 0.97 0.85  CALCIUM 9.1 9.4  TSH  --  5.58*   Liver Function Tests: Recent Labs    07/19/23 0928 10/14/23 1343  AST 14 16  ALT 14 14  BILITOT 0.6 0.5  PROT 6.6 7.4   No results for input(s): LIPASE, AMYLASE in the last 8760 hours. No results for input(s): AMMONIA in the last 8760 hours. CBC: Recent Labs    07/19/23 0928 10/14/23 1343  WBC 6.2 7.8  NEUTROABS 3,119 4,781  HGB 14.9 14.5  HCT 45.4 44.3  MCV 89.0 91.9  PLT 230 221   Lipid Panel: Recent Labs    07/19/23 0928  CHOL 88  HDL 44  LDLCALC 30  TRIG 60  CHOLHDL 2.0   TSH: Recent Labs    10/14/23 1343  TSH 5.58*   A1C: Lab Results  Component Value Date   HGBA1C 6.2 (H) 07/19/2023     Assessment/Plan Immunization due -     Flu vaccine HIGH DOSE PF(Fluzone Trivalent)   Assessment & Plan Hypertension Hypertension management adjusted due to dizziness and hypotension. Improvement noted with reduced  amlodipine  dose. - Continue lisinopril  40 mg daily. - Continue carvedilol  3.125 mg twice daily. - Continue amlodipine  5 mg daily. - Monitor blood pressure at home. - Follow up with cardiologist in three months with blood pressure log.  Hyperlipidemia Hyperlipidemia well-controlled. LDL at goal. - Continue Repatha  140 mg injection every two weeks. - Continue Zetia . - Recheck cholesterol levels today.  Chronic obstructive pulmonary disease (COPD) COPD stable. No recent severe exacerbations. Seasonal symptom increase anticipated. - Continue Trelegy one puff daily. - Use budesonide nebulizer as needed for severe exacerbations.  Dementia Dementia managed with Namenda . No adherence issues reported. - Continue Namenda  10 mg twice daily.  Depression Mood has been well controlled, not current on medications    Return in about 6 months (around 07/27/2024) for routine follow up.  Rashan Rounsaville K. Caro BODILY [facility] [phone]

## 2024-01-29 ENCOUNTER — Ambulatory Visit: Payer: Self-pay | Admitting: Nurse Practitioner

## 2024-01-30 LAB — CBC WITH DIFFERENTIAL/PLATELET
Absolute Lymphocytes: 1903 {cells}/uL (ref 850–3900)
Absolute Monocytes: 682 {cells}/uL (ref 200–950)
Basophils Absolute: 50 {cells}/uL (ref 0–200)
Basophils Relative: 0.7 %
Eosinophils Absolute: 128 {cells}/uL (ref 15–500)
Eosinophils Relative: 1.8 %
HCT: 45.5 % (ref 38.5–50.0)
Hemoglobin: 14.7 g/dL (ref 13.2–17.1)
MCH: 30.2 pg (ref 27.0–33.0)
MCHC: 32.3 g/dL (ref 32.0–36.0)
MCV: 93.4 fL (ref 80.0–100.0)
MPV: 9.4 fL (ref 7.5–12.5)
Monocytes Relative: 9.6 %
Neutro Abs: 4338 {cells}/uL (ref 1500–7800)
Neutrophils Relative %: 61.1 %
Platelets: 268 Thousand/uL (ref 140–400)
RBC: 4.87 Million/uL (ref 4.20–5.80)
RDW: 12 % (ref 11.0–15.0)
Total Lymphocyte: 26.8 %
WBC: 7.1 Thousand/uL (ref 3.8–10.8)

## 2024-01-30 LAB — COMPREHENSIVE METABOLIC PANEL WITH GFR
AG Ratio: 1.8 (calc) (ref 1.0–2.5)
ALT: 13 U/L (ref 9–46)
AST: 15 U/L (ref 10–35)
Albumin: 4.6 g/dL (ref 3.6–5.1)
Alkaline phosphatase (APISO): 71 U/L (ref 35–144)
BUN: 16 mg/dL (ref 7–25)
CO2: 28 mmol/L (ref 20–32)
Calcium: 9.2 mg/dL (ref 8.6–10.3)
Chloride: 100 mmol/L (ref 98–110)
Creat: 0.79 mg/dL (ref 0.70–1.28)
Globulin: 2.5 g/dL (ref 1.9–3.7)
Glucose, Bld: 109 mg/dL — ABNORMAL HIGH (ref 65–99)
Potassium: 4.7 mmol/L (ref 3.5–5.3)
Sodium: 138 mmol/L (ref 135–146)
Total Bilirubin: 0.6 mg/dL (ref 0.2–1.2)
Total Protein: 7.1 g/dL (ref 6.1–8.1)
eGFR: 91 mL/min/1.73m2 (ref >=60)

## 2024-01-30 LAB — LIPID PANEL
Cholesterol: 91 mg/dL (ref <200)
HDL: 47 mg/dL (ref >=40)
LDL Cholesterol (Calc): 27 mg/dL
Non-HDL Cholesterol (Calc): 44 mg/dL (ref <130)
Total CHOL/HDL Ratio: 1.9 (calc) (ref <5.0)
Triglycerides: 87 mg/dL (ref <150)

## 2024-01-30 LAB — HEMOGLOBIN A1C: Hgb A1c MFr Bld: 5.9 % — ABNORMAL HIGH (ref <5.7)

## 2024-01-30 LAB — TEST AUTHORIZATION

## 2024-02-04 ENCOUNTER — Encounter: Payer: Self-pay | Admitting: Nurse Practitioner

## 2024-02-08 ENCOUNTER — Other Ambulatory Visit: Payer: Self-pay | Admitting: Cardiology

## 2024-02-11 ENCOUNTER — Other Ambulatory Visit: Payer: Self-pay | Admitting: Cardiology

## 2024-02-13 MED ORDER — LISINOPRIL 40 MG PO TABS: 40.0000 mg | ORAL_TABLET | Freq: Every day | ORAL | 3 refills | Status: AC

## 2024-02-20 ENCOUNTER — Other Ambulatory Visit: Payer: Self-pay | Admitting: Cardiology

## 2024-02-24 ENCOUNTER — Encounter: Payer: Self-pay | Admitting: Cardiology

## 2024-03-03 ENCOUNTER — Ambulatory Visit (HOSPITAL_COMMUNITY)
Admission: RE | Admit: 2024-03-03 | Discharge: 2024-03-03 | Disposition: A | Discharge status: Home / Self Care | Source: Ambulatory Visit | Attending: Internal Medicine | Admitting: Internal Medicine

## 2024-03-03 DIAGNOSIS — I10 Essential (primary) hypertension: Secondary | ICD-10-CM | POA: Diagnosis present

## 2024-03-03 DIAGNOSIS — Z952 Presence of prosthetic heart valve: Secondary | ICD-10-CM | POA: Diagnosis present

## 2024-03-03 LAB — ECHOCARDIOGRAM COMPLETE
AR max vel: 1.43 cm2
AV Area VTI: 1.5 cm2
AV Area mean vel: 1.55 cm2
AV Mean grad: 7 mmHg
AV Peak grad: 14.9 mmHg
Ao pk vel: 1.93 m/s
Area-P 1/2: 2.82 cm2
S' Lateral: 3 cm

## 2024-03-04 ENCOUNTER — Ambulatory Visit: Payer: Self-pay | Admitting: Emergency Medicine

## 2024-03-18 ENCOUNTER — Encounter: Payer: Self-pay | Admitting: Cardiology

## 2024-03-18 MED ORDER — REPATHA SURECLICK 140 MG/ML ~~LOC~~ SOAJ: 140.0000 mg | SUBCUTANEOUS | 11 refills | Status: AC

## 2024-04-16 ENCOUNTER — Other Ambulatory Visit: Payer: Self-pay | Admitting: Emergency Medicine

## 2024-04-16 ENCOUNTER — Other Ambulatory Visit: Payer: Self-pay | Admitting: Cardiology

## 2024-04-22 ENCOUNTER — Ambulatory Visit: Attending: Cardiology | Admitting: Cardiology

## 2024-04-22 VITALS — BP 118/54 | HR 60 | Ht 67.0 in | Wt 159.0 lb

## 2024-04-22 DIAGNOSIS — I2510 Atherosclerotic heart disease of native coronary artery without angina pectoris: Secondary | ICD-10-CM

## 2024-04-22 DIAGNOSIS — Z952 Presence of prosthetic heart valve: Secondary | ICD-10-CM | POA: Diagnosis not present

## 2024-04-22 DIAGNOSIS — Z951 Presence of aortocoronary bypass graft: Secondary | ICD-10-CM

## 2024-04-22 DIAGNOSIS — E785 Hyperlipidemia, unspecified: Secondary | ICD-10-CM

## 2024-04-22 DIAGNOSIS — I6523 Occlusion and stenosis of bilateral carotid arteries: Secondary | ICD-10-CM | POA: Diagnosis not present

## 2024-04-22 MED ORDER — CLOPIDOGREL BISULFATE 75 MG PO TABS: 75.0000 mg | ORAL_TABLET | Freq: Every day | ORAL | Status: AC

## 2024-04-22 NOTE — Progress Notes (Signed)
" °  Cardiology Office Note:  .   Date:  04/22/2024  ID:  Albert Hayes, DOB [birth year], MRN [id number] PCP: Caro Harlene POUR, NP  [facility] Providers Cardiologist:  Oneil Parchment, MD     History of Present Illness: .   Albert Hayes is a 79 y.o. male Discussed the use of AI scribe   History of Present Illness Albert Hayes is a 79 year old male with coronary artery disease post-CABG and TAVR who presents for follow-up.  He feels stable with no chest pain. He sometimes hears and feels the valve, especially when resting or sleeping, describing it as a loud sound that makes it difficult to rest his ear on a pillow.  He has a history of aortic valve replacement with a Medtronic 29 mm valve. An echocardiogram was performed in November 2025. His current medications include aspirin , Plavix  75 mg once daily, carvedilol , lisinopril , and Repatha  every two weeks. His prior LDL was 30.  He has a history of carotid artery disease and underwent a carotid endarterectomy.  He has a history of peripheral vascular disease, stroke, and thrombocytopenia. He is a former tobacco user and has statin intolerance.  He has a past diagnosis of dementia, confirmed by MRI. Initially, he was in a wheelchair and had significant difficulty interacting with people. Medication and infusions have improved his symptoms significantly over the last six months. He feels more active and engaged, walking three times a week at 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      ROS: No CP, no SOB.   Studies Reviewed: .        Results Labs LDL: 30  Diagnostic Echocardiogram (02/2024): Normal left ventricular systolic function; normal prosthetic aortic valve function. Risk Assessment/Calculations:            Physical Exam:   VS:  BP (!) 118/54   Pulse 60   Ht 5' 7 (1.702 m)   Wt 159 lb (72.1 kg)   SpO2 96%   BMI 24.90 kg/m    Wt Readings from Last 3  Encounters:  04/22/24 159 lb (72.1 kg)  01/27/24 154 lb (69.9 kg)  01/21/24 158 lb 12.8 oz (72 kg)    GEN: Well nourished, well developed in no acute distress NECK: No JVD; No carotid bruits CARDIAC: RRR, soft systolic murmur, S2 prominant, no rubs, no gallops RESPIRATORY:  Clear to auscultation without rales, wheezing or rhonchi  ABDOMEN: Soft, non-tender, non-distended EXTREMITIES:  No edema; No deformity   ASSESSMENT AND PLAN: .    Assessment and Plan Assessment & Plan Coronary artery disease status post CABG Coronary artery disease is well-managed post-CABG with no chest pain. Plavix  is continued for antiplatelet therapy. 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 Echocardiogram in November showed excellent valve function with normal pump function. Reports sensation of valve closure, likely normal post-procedure sensation. - Continue current management  Bilateral carotid artery stenosis post endarterectomy Carotid artery disease post-endarterectomy. - Continue current management  Hyperlipidemia with statin intolerance Hyperlipidemia is well-controlled with Repatha , achieving LDL levels as low as 30. Statin intolerance noted, Repatha  is effective. - Continue Repatha  every two weeks  Hypertension Managed with lisinopril  and amlodipine .           Dispo: 6 mth APP  Signed, Oneil Parchment, MD  "

## 2024-04-22 NOTE — Patient Instructions (Signed)
 Medication Instructions:   STOP TAKING :  ASPIRIN    *If you need a refill on your cardiac medications before your next appointment, please call your pharmacy*   Lab Work: NONE ORDERED  TODAY    If you have labs (blood work) drawn today and your tests are completely normal, you will receive your results only by: MyChart Message (if you have MyChart) OR A paper copy in the mail If you have any lab test that is abnormal or we need to change your treatment, we will call you to review the results.   Testing/Procedures: NONE ORDERED  TODAY     Follow-Up: At [facility], you and your health needs are our priority.  As part of our continuing mission to provide you with exceptional heart care, our providers are all part of one team.  This team includes your primary Cardiologist (physician) and Advanced Practice Providers or APPs (Physician Assistants and Nurse Practitioners) who all work together to provide you with the care you need, when you need it.  Your next appointment:   6 month(s)   Kenzie Campbell, NP        Provider:  We recommend signing up for the patient portal called MyChart.  Sign up information is provided on this After Visit Summary.  MyChart is used to connect with patients for Virtual Visits (Telemedicine).  Patients are able to view lab/test results, encounter notes, upcoming appointments, etc.  Non-urgent messages can be sent to your provider as well.   To learn more about what you can do with MyChart, go to [email or web address].   Other Instructions

## 2024-05-01 ENCOUNTER — Encounter: Payer: Self-pay | Admitting: Nurse Practitioner

## 2024-07-27 ENCOUNTER — Ambulatory Visit: Payer: Self-pay | Admitting: Nurse Practitioner

## 2024-08-11 ENCOUNTER — Ambulatory Visit: Payer: Self-pay | Admitting: Nurse Practitioner

## 2024-08-24 ENCOUNTER — Encounter (INDEPENDENT_AMBULATORY_CARE_PROVIDER_SITE_OTHER)

## 2024-08-24 ENCOUNTER — Ambulatory Visit (INDEPENDENT_AMBULATORY_CARE_PROVIDER_SITE_OTHER): Admitting: Vascular Surgery
